# Patient Record
Sex: Female | Born: 1955 | Race: Black or African American | Hispanic: No | Marital: Married | Smoking: Never smoker
Health system: Southern US, Community
[De-identification: ages and names within clinical notes are randomized; demographics above are authoritative.]

## PROBLEM LIST (undated history)

## (undated) DIAGNOSIS — I129 Hypertensive chronic kidney disease with stage 1 through stage 4 chronic kidney disease, or unspecified chronic kidney disease: Secondary | ICD-10-CM

## (undated) DIAGNOSIS — N182 Chronic kidney disease, stage 2 (mild): Secondary | ICD-10-CM

## (undated) DIAGNOSIS — G43909 Migraine, unspecified, not intractable, without status migrainosus: Secondary | ICD-10-CM

## (undated) DIAGNOSIS — E559 Vitamin D deficiency, unspecified: Secondary | ICD-10-CM

## (undated) DIAGNOSIS — I10 Essential (primary) hypertension: Secondary | ICD-10-CM

## (undated) DIAGNOSIS — E7800 Pure hypercholesterolemia, unspecified: Secondary | ICD-10-CM

## (undated) DIAGNOSIS — G4730 Sleep apnea, unspecified: Secondary | ICD-10-CM

## (undated) DIAGNOSIS — E663 Overweight: Secondary | ICD-10-CM

## (undated) HISTORY — DX: Pure hypercholesterolemia, unspecified: E78.00

## (undated) HISTORY — DX: Migraine, unspecified, not intractable, without status migrainosus: G43.909

## (undated) HISTORY — PX: OTHER SURGICAL HISTORY: SHX169

## (undated) HISTORY — DX: Vitamin D deficiency, unspecified: E55.9

## (undated) HISTORY — DX: Hypertensive chronic kidney disease with stage 1 through stage 4 chronic kidney disease, or unspecified chronic kidney disease: I12.9

## (undated) HISTORY — DX: Essential (primary) hypertension: I10

## (undated) HISTORY — DX: Chronic kidney disease, stage 2 (mild): N18.2

## (undated) HISTORY — DX: Overweight: E66.3

## (undated) HISTORY — PX: TOTAL ABDOMINAL HYSTERECTOMY W/ BILATERAL SALPINGOOPHORECTOMY: SHX83

---

## 2000-09-10 ENCOUNTER — Encounter: Payer: Self-pay | Admitting: Emergency Medicine

## 2000-09-10 ENCOUNTER — Encounter: Admission: RE | Admit: 2000-09-10 | Discharge: 2000-09-10 | Payer: Self-pay | Admitting: Emergency Medicine

## 2000-11-24 ENCOUNTER — Other Ambulatory Visit: Admission: RE | Admit: 2000-11-24 | Discharge: 2000-11-24 | Payer: Self-pay | Admitting: Obstetrics and Gynecology

## 2001-04-28 ENCOUNTER — Emergency Department (HOSPITAL_COMMUNITY): Admission: EM | Admit: 2001-04-28 | Discharge: 2001-04-28 | Payer: Self-pay | Admitting: Emergency Medicine

## 2001-04-28 ENCOUNTER — Encounter: Payer: Self-pay | Admitting: Emergency Medicine

## 2001-06-04 ENCOUNTER — Encounter: Admission: RE | Admit: 2001-06-04 | Discharge: 2001-09-02 | Payer: Self-pay | Admitting: Internal Medicine

## 2001-12-17 ENCOUNTER — Other Ambulatory Visit: Admission: RE | Admit: 2001-12-17 | Discharge: 2001-12-17 | Payer: Self-pay | Admitting: Obstetrics and Gynecology

## 2002-01-18 ENCOUNTER — Encounter: Admission: RE | Admit: 2002-01-18 | Discharge: 2002-04-18 | Payer: Self-pay | Admitting: Internal Medicine

## 2002-03-15 ENCOUNTER — Encounter: Admission: RE | Admit: 2002-03-15 | Discharge: 2002-03-15 | Payer: Self-pay | Admitting: Internal Medicine

## 2002-03-15 ENCOUNTER — Encounter: Payer: Self-pay | Admitting: Internal Medicine

## 2002-11-15 ENCOUNTER — Ambulatory Visit (HOSPITAL_COMMUNITY): Admission: RE | Admit: 2002-11-15 | Discharge: 2002-11-15 | Payer: Self-pay | Admitting: Cardiology

## 2003-01-17 ENCOUNTER — Encounter (INDEPENDENT_AMBULATORY_CARE_PROVIDER_SITE_OTHER): Payer: Self-pay | Admitting: Specialist

## 2003-01-18 ENCOUNTER — Inpatient Hospital Stay (HOSPITAL_COMMUNITY): Admission: RE | Admit: 2003-01-18 | Discharge: 2003-01-19 | Payer: Self-pay | Admitting: Obstetrics and Gynecology

## 2004-04-09 ENCOUNTER — Other Ambulatory Visit: Admission: RE | Admit: 2004-04-09 | Discharge: 2004-04-09 | Payer: Self-pay | Admitting: Obstetrics and Gynecology

## 2004-09-18 ENCOUNTER — Encounter: Admission: RE | Admit: 2004-09-18 | Discharge: 2004-09-18 | Payer: Self-pay | Admitting: Internal Medicine

## 2005-05-20 ENCOUNTER — Other Ambulatory Visit: Admission: RE | Admit: 2005-05-20 | Discharge: 2005-05-20 | Payer: Self-pay | Admitting: Obstetrics and Gynecology

## 2005-07-23 ENCOUNTER — Emergency Department (HOSPITAL_COMMUNITY): Admission: EM | Admit: 2005-07-23 | Discharge: 2005-07-23 | Payer: Self-pay | Admitting: Family Medicine

## 2006-04-21 ENCOUNTER — Emergency Department (HOSPITAL_COMMUNITY): Admission: EM | Admit: 2006-04-21 | Discharge: 2006-04-21 | Payer: Self-pay | Admitting: Family Medicine

## 2009-03-07 ENCOUNTER — Emergency Department (HOSPITAL_COMMUNITY): Admission: EM | Admit: 2009-03-07 | Discharge: 2009-03-07 | Payer: Self-pay | Admitting: Family Medicine

## 2010-05-31 ENCOUNTER — Other Ambulatory Visit: Payer: Self-pay | Admitting: Internal Medicine

## 2010-05-31 DIAGNOSIS — R42 Dizziness and giddiness: Secondary | ICD-10-CM

## 2010-06-06 ENCOUNTER — Ambulatory Visit
Admission: RE | Admit: 2010-06-06 | Discharge: 2010-06-06 | Disposition: A | Discharge status: Home / Self Care | Payer: 59 | Source: Ambulatory Visit | Attending: Internal Medicine | Admitting: Internal Medicine

## 2010-06-06 DIAGNOSIS — R42 Dizziness and giddiness: Secondary | ICD-10-CM

## 2010-06-06 MED ORDER — GADOBENATE DIMEGLUMINE 529 MG/ML IV SOLN: 15.0000 mL | Freq: Once | INTRAVENOUS | Status: AC | PRN

## 2010-09-07 NOTE — Op Note (Signed)
NAME:  Morgan Norman, Morgan Norman                  ACCOUNT NO.:  [id number]   MEDICAL RECORD NO.:  [id number]                   PATIENT TYPE:  OBV   LOCATION:  9309                                 FACILITY:  [facility]   PHYSICIAN:  Naima A. Dillard, M.D.              DATE OF BIRTH:  [birth year]   DATE OF PROCEDURE:  01/17/2003  DATE OF DISCHARGE:                                 OPERATIVE REPORT   PREOPERATIVE DIAGNOSES:  1. Symptomatic fibroids.  2. Dysmenorrhea.   POSTOPERATIVE DIAGNOSES:  1. Symptomatic fibroids.  2. Dysmenorrhea.  3. Possible endometriosis.   PROCEDURES:  1. Laparoscopically assisted vaginal hysterectomy.  2. Bilateral salpingo-oophorectomy.  3. Excisional biopsy consistent with area of endometriosis on the left     uterosacral ligament.   SURGEON:  Naima A. Normand Sloop, M.D.   ASSISTANTMarquis Lunch. Adline Peals.   ANESTHESIA:  General endotracheal tube.   ESTIMATED BLOOD LOSS:  500 mL.   URINE OUTPUT:  500 mL clear at the end of the procedure.   INTRAVENOUS FLUIDS:  2300 mL of crystalloid.   COMPLICATIONS:  None.   FINDINGS:  An 8-week size, slightly irregularly boggy uterus.  Normal-  appearing tubes and ovaries bilaterally.  Normal abdominal anatomy.   PROCEDURE IN DETAIL:  The patient was taken to the operating room where she  was given general anesthesia and placed in the dorsal lithotomy position and  prepped and draped in a normal sterile fashion.  A bivalved speculum was  placed into the vagina.  The anterior lip of the cervix was grasped with a  Hulka tenaculum.  After the tenaculum was placed into the uterine cavity, a  Foley catheter was placed.  Attention was then turned to the patient's  abdomen where 5 mL of 0.25 Marcaine was placed in the infraumbilical prior  incision.  An incision was then made with a scalpel.  A Veress needle was  placed at a 45 degree angle __________ abdominal wall.  Intra-abdominal  placement confirmed with fluid filled  syringe and decrease in CO2 pressure  with insufflation of CO2 gas.  The abdomen was insufflated with 3.5 L of CO2  gas.  The instruments were removed.  A 10 mm trocar was placed into the  intra-abdominal cavity without difficulty.  The findings noted above were  seen.  At this point, two other 5 mm trocars were placed in the right and  left quadrants of the abdomen about 5 cm below the umbilicus.  Attention was  then made to not __________ the anatomy to avoid the epigastric vessels.  Marcaine 0.25% 5 mL total was used.  The 4-5 mm trocars were placed under  direct visualization with the laparoscope.  At total of 10 mL 0.25% Marcaine  was used.  The patient's right round ligament was grasped, cauterized, and  cut.  The patient's right utero-ovarian ligament was cauterized and cut.  The patient's left round ligament was cauterized and cut.  The patient's  left utero-ovarian ligament was cauterized and cut.  The patient's bladder  flap was then created with hydrodissection and cut using scissors.  The  bladder was dissected off of the uterus.  The patient's right ureter was  identified.  The infundibulopelvic ligament was doubly ligated with  endoloops and the ovary and tube were removed without difficulty and placed  in the cul-de-sac.  The patient's left infundibulopelvic ligament was also  doubly ligated after the left ureter was identified and noted to be fully  away.  The ovary and tube were then cut away from the ligated area.  Hemostasis was assured.  The endoloops, however, that were placed around the  area were found to be a little loose, so the infundibulopelvic ligament was  cauterized with bipolar cautery also when hemostasis was assured.  Attention  was then turned to the vagina where the Hulka tenaculum was removed.  A  weighted speculum was placed in the posterior fourchette.  The anterior and  posterior lips of the cervix were grasped with single-tooth tenaculum.  Pitressin  20 mL, 20 units in 100 mL, was placed in a circumferential manner  around the cervix.  The cervix was then cut in a circumferential manner with  the knife in the pubovesical fascia.  The posterior aspect of the vagina was  dissected off of the cervix.  The posterior cul-de-sac was then entered.  We  noted that we were in the posterior cul-de-sac from some of the bleeding  that was seen.  Because of the gas that escaped, the uterosacral ligaments  were clamped, cut, and suture ligated.  Hemostasis was assured.  Then we  entered the anterior space.  The peritoneum was identified and tented up  sharply.  The bladder was noted to be intact.  The uterine arteries were  singly clamped, cut, and ligated.  Hemostasis was assured.  The uterus was  then flipped.  There was a small pedicle on both sides, which were clamped,  cut, and ligated.  Hemostasis was assured.  McCall suture was then placed.  The vaginal cuff was closed with 0 chromic in a running fashion.  McCall  suture was then tied.  Attention was then turned to the abdomen.  There was  a small amount of bleeding noted along the vaginal cuff, which was made  hemostatic using Bovie cautery.  The left infundibulopelvic ligament inner  loops were still loose, however, there was no bleeding.  The cautery that  was done to that area was noted to be hemostatic.  Irrigation was then done  and everything was noted to be hemostatic.  All trocars were removed under  direct visualization of the laparoscope.  The fascia of the umbilical port  was closed with 0 Vicryl.  The skin sutures were closed with 3-0 Vicryl in  the subcuticular fashion.  The sponge, lap, and needle counts were correct x  2.  The patient went to the recovery room in stable condition.                                               Naima A. Normand Sloop, M.D.    NAD/MEDQ  D:  01/17/2003  T:  01/17/2003  Job:  [id number]

## 2010-09-07 NOTE — H&P (Signed)
NAME:  Morgan Norman, Morgan Norman                  ACCOUNT NO.:  [id number]   MEDICAL RECORD NO.:  [id number]                   PATIENT TYPE:  OBV   LOCATION:  NA                                   FACILITY:  [facility]   PHYSICIAN:  Naima A. Dillard, M.D.              DATE OF BIRTH:  12/19/1955   DATE OF ADMISSION:  01/16/2003  DATE OF DISCHARGE:                                HISTORY & PHYSICAL   CHIEF COMPLAINT:  Symptomatic fibroids, desires definitive treatment.   HISTORY OF PRESENT ILLNESS:  The patient is a 55 year old African-American  female gravida 2, para 1 who presented back in August of 2003 with  dysmenorrhea and complaints of menorrhagia. The patient stated that she was  having periods every month and had some cramping. Anaprox did give her some  relief but not total. Says that her periods are coming every month, lasting  3-4 days and she soaks through a tampon and pad through the first 3-4 days  every hour. The patient denies having any chest pain or shortness of breath.  She denies having any intermenstrual bleeding. The patient did have an  ultrasound which was significant for a 10 week size uterus and several small  fibroids, the largest being 1.3 x 1.2 cm. Her uterus measures 10.3 x 6.1 x  5.8 in size. Endometrial strip was 9.3 mm. Endometrial biopsy in the year of  2003 was significant for benign secretory endometrium. The patient has tried  birth control pills, Anaprox and Lupron but still desires to have definitive  treatment along with bilateral salpingo-oophorectomy.   PAST MEDICAL HISTORY:  Significant for high blood pressure and seasonal  allergies.   ALLERGIES:  No known drug allergies.   MEDICATIONS:  Include Cardizem LA 240 mg q.h.s., Singulair 10 mg q.d.,  imipramine/HCL 50 mg q.h.s.   PAST OBSTETRICAL HISTORY:  Significant for cesarean section x1 and a tubal  ligation. Also a D&E x1.   PAST SURGICAL HISTORY:  As above. Also significant for a  bunionectomy.   SOCIAL HISTORY:  Negative for tobacco, alcohol, and illicit drug use.   REVIEW OF SYSTEMS:  She wears corrected lenses. CARDIOVASCULAR: She has high  blood pressure. GI: Unremarkable. MUSCULOSKELETAL: Unremarkable. GU: As  above.   PHYSICAL EXAMINATION:  VITAL SIGNS: Weight 176 pounds. Blood pressure  120/80.  HEENT: Pupils are equal. Hearing is normal. Throat is clear. Thyroid is not  enlarged.  HEART: Regular rate and rhythm.  CHEST: Clear to auscultation bilaterally.  BREAST: No masses, discharge, skin changes, or nipple retraction.  BACK: No CVA tenderness.  ABDOMEN: Non-tender without mass or organomegaly. Soft and nontender.  EXTREMITIES: No clubbing, cyanosis, or edema bilaterally.  NEUROLOGIC: Examination is within normal limits.  PELVIC: Vulva and vaginal examination is within normal limits. Cervix is  nontender without lesions. Uterus is about 12 week size and irregular.  Adnexa has no masses.  RECTOVAGINAL: Unremarkable.   LABORATORY DATA:  Hemoglobin 11.4. Pap  smear on August 2003 was within  normal limits. The patient has never had an abnormal Pap smear.   ASSESSMENT:  Symptomatic fibroids with failed medical management of  treatment of birth control pills and Anaprox for dysmenorrhea. The patient  desires definitive treatment. She has been on Lupron for a total of 4 months  to help stop the irregular bleeding. The patient no longer wants to continue  with Lupron for another 2 months to see if this will help. She wants  definitive treatment. She understands the risk of a laparoscopic assisted  vaginal hysterectomy, bilateral salpingo-oophorectomy are but not limited to  bleeding, infection, damage to internal organs such as bowel, bladder and  major blood vessels. The patient still desires to proceed. All other forms  of treatment for fibroids were reviewed with the patient in detail. Uterine  embolization, other forms of birth control, D&C  hysteroscopy, ablation of  the uterine cavity, observation. The patient has decided to proceed.                                               Naima A. Normand Sloop, M.D.    NAD/MEDQ  D:  01/16/2003  T:  01/17/2003  Job:  [id number]

## 2010-09-07 NOTE — Discharge Summary (Signed)
NAME:  Morgan Norman, Morgan Norman                  ACCOUNT NO.:  [id number]   MEDICAL RECORD NO.:  [id number]                   PATIENT TYPE:  OBV   LOCATION:  9309                                 FACILITY:  [facility]   PHYSICIAN:  Naima A. Dillard, M.D.              DATE OF BIRTH:  28-Mar-1956   DATE OF ADMISSION:  01/17/2003  DATE OF DISCHARGE:  01/19/2003                                 DISCHARGE SUMMARY   ADMISSION DIAGNOSES:  1. Dysmenorrhea.  2. Symptomatic fibroids.   PROCEDURE:  1. Laparoscopic assisted vaginal hysterectomy.  2. Bilateral salpingo-oophorectomy.   PRE HOSPITAL MEDICATIONS:  1. Colace.  2. Tylox.  3. Ibuprofen.  4. Phenergan.  5. K-Dur.   ACTIVITY:  The patient is not to do any driving for two weeks and no heavy  lifting for four weeks.  No intercourse and to remain on pelvic rest for six  weeks.   FOLLOWUP:  She is to follow up with [facility] in six weeks.  She was also given an instruction sheet to go home with.  She has an  appointment with me on March 07, 2003 at 3 p.m.   HOSPITAL COURSE:  Morgan Norman is a 55 year old African-American female  gravida 2, para 1 who presented for a laparoscopic assisted vaginal  hysterectomy, bilateral salpingo-oophorectomy secondary to dysmenorrhea and  symptomatic fibroids.  Also failed medical treatment in the office.  The  patient underwent the surgery without any complications.  On postoperative  day #1 she had slow bowel recovery with nausea and vomiting x1.  Her vital  signs remained stable and her examination was benign.  On postoperative day  #2 she was tolerating a regular diet, passing flatus.  Vital signs were  stable.  She remained afebrile.  Abdomen was soft and nontender.  She had a  small amount of vaginal bleeding.  Her vaginal packing was removed on  postoperative day #1.  Extremities had no clubbing, cyanosis, edema.   LABORATORY DATA:  Preoperative hemoglobin 13, postoperative  hemoglobin 11.  Preoperative platelets 302,000, postoperative 240,000.  Preoperative  potassium 3.3, postoperative potassium 3.1.  We tried to replace her with a  K __________.  The patient could not tolerate the burning from the K  ________ so she was given K-Dur p.o.    DISPOSITION:  The patient was deemed to benefit from her hospital stay and  is to be discharged today, January 19, 2003 to home.  The patient is to  follow up with me in six weeks and agrees with plan.                                               Naima A. Normand Sloop, M.D.    NAD/MEDQ  D:  01/19/2003  T:  01/19/2003  Job:  [id number]   cc:  Robyn N. Allyne Gee, M.D.  [address]  [address]  [place]  [place] [address]  Fax: [phone]

## 2010-09-07 NOTE — H&P (Signed)
   NAME:  Morgan Norman, Morgan Norman                  ACCOUNT NO.:  [id number]   MEDICAL RECORD NO.:  [id number]                   PATIENT TYPE:  OIB   LOCATION:  2860                                 FACILITY:  [facility]   PHYSICIAN:  Richard A. Alanda Amass, M.D.          DATE OF BIRTH:  March 27, 1956   DATE OF ADMISSION:  11/15/2002  DATE OF DISCHARGE:                                HISTORY & PHYSICAL   TILT-TABLE STUDY:  This 55 year old African American, married, working mother was referred by  Dr. Jacinto Halim for tilt-table testing.  She has a history of syncope about a year  and a half ago and another recurrent episode recently occurring at 3:30 a.m.  after getting up to get a glass of water suffering a transient syncopal  episode hitting her neck and back, but no significant injuries.  She has  been on diuretic therapy with Dyazide, treated with Effexor, ibuprofen,  Zyrtec-D b.i.d., imipramine, and been treated with hormone replacement  therapy for fibroids and dysfunctional uterine bleeding.  A CT scan at  [facility] after a recent episode of syncope was  negative.  She has had a remote normal 2-D echo, 11/03, and has had normal  baseline laboratory with normal renal function and no diabetes, normal CBC  and differential.   She was brought to the second floor CP laboratory in the postabsorptive  state, having not taken a.m. medications.  Tilt-table testing was done using  standard tilt protocol and subsequent isoproterenol stimulation.   The patient was tilted for 30 minutes at 70 degrees.  There was no  bradycardia or hypotension and no carotid sinus hypersensitivity on testing.   She was brought back to the supine position for 10 minutes and then  isoproterenol infusion was begun per protocol.  She was titrated up to 1.5  mcg/kg/minute to a heart rate of greater than 100 and reached a heart rate  of up 117.  Blood pressure remained stable and there was no bradycardia  or  hypotension during 10-minute isoproterenol infusion - provocation.  She was  then brought back to the supine position.  Isoproterenol was discontinued.   The patient tolerated the procedure well.   Negative tilt-table test with no evidence of chronotropic or vasodepressor  response to upright tilt testing and subsequent isoproterenol provocation.   The patient will follow up with Dr. Jacinto Halim for further recommendations for a  presumptive diagnosis of vasovagal syncope, possibly in part related to the  patient's current medication.                                                Richard A. Alanda Amass, M.D.    RAW/MEDQ  D:  11/15/2002  T:  11/15/2002  Job:  [id number]

## 2012-03-31 ENCOUNTER — Other Ambulatory Visit: Payer: Self-pay | Admitting: Adult Health

## 2012-03-31 DIAGNOSIS — R05 Cough: Secondary | ICD-10-CM

## 2012-04-07 ENCOUNTER — Ambulatory Visit
Admission: RE | Admit: 2012-04-07 | Discharge: 2012-04-07 | Disposition: A | Discharge status: Home / Self Care | Source: Ambulatory Visit | Attending: Adult Health | Admitting: Adult Health

## 2012-04-07 DIAGNOSIS — R05 Cough: Secondary | ICD-10-CM

## 2013-09-16 IMAGING — RF DG ESOPHAGUS
16 of 20 series · 19 of 24 positions shown · non-contrast
Comparison: None.

CLINICAL DATA: Cough, feeling of an object stuck in throat

ESOPHOGRAM/BARIUM SWALLOW
TECHNIQUE: Combined double contrast and single contrast
examination performed using effervescent crystals, thick barium
liquid, and thin barium liquid.
Fluoroscopy time:  1.8 minutes.

[Series 2: run · 1 of 1 slices shown (1 of 16)]
[im 1/1]
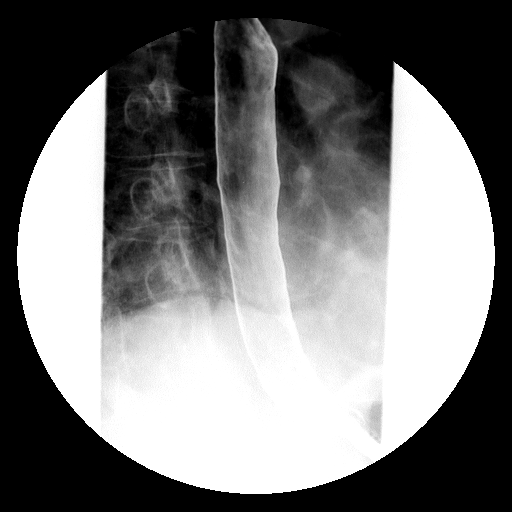

[Series 3: run · 1 of 1 slices shown (2 of 16)]
[im 1/1]
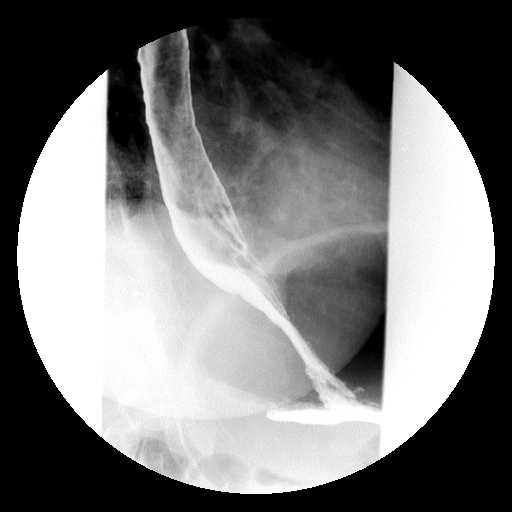

[Series 5: run · 1 of 1 slices shown (3 of 16)]
[im 1/1]
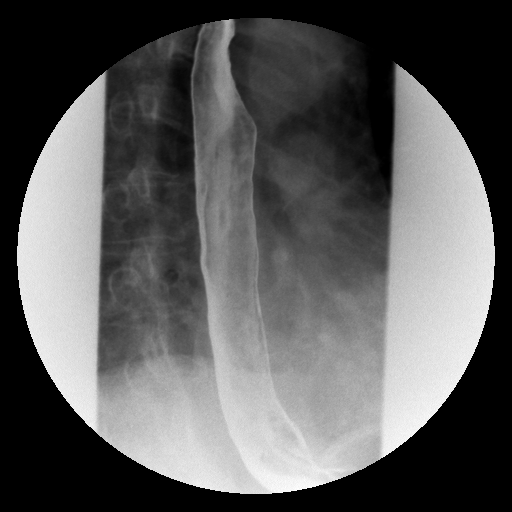

[Series 7: run · 1 of 1 slices shown (4 of 16)]
[im 1/1]
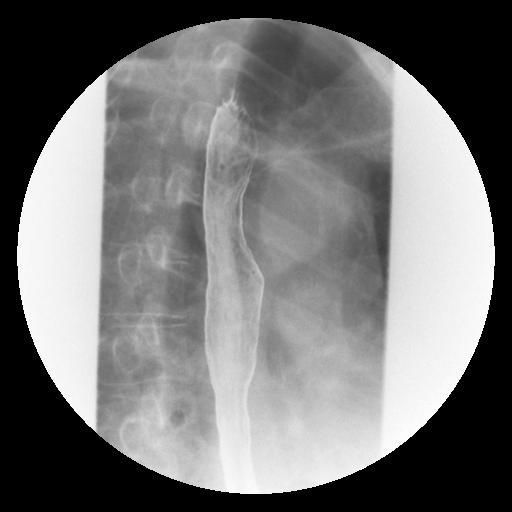

[Series 8: run · 3 of 9 slices shown (5 of 16)]
[im 1/9]
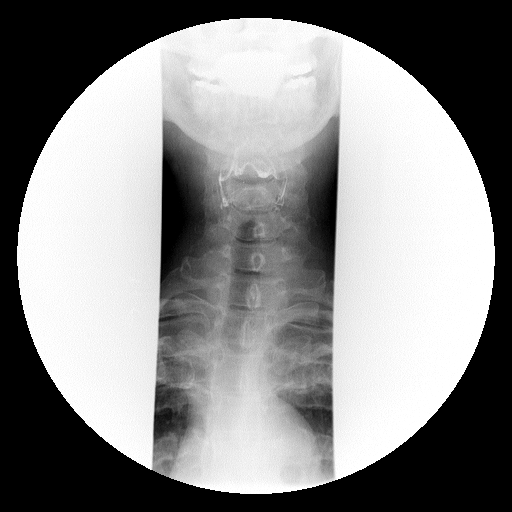
[im 3/9]
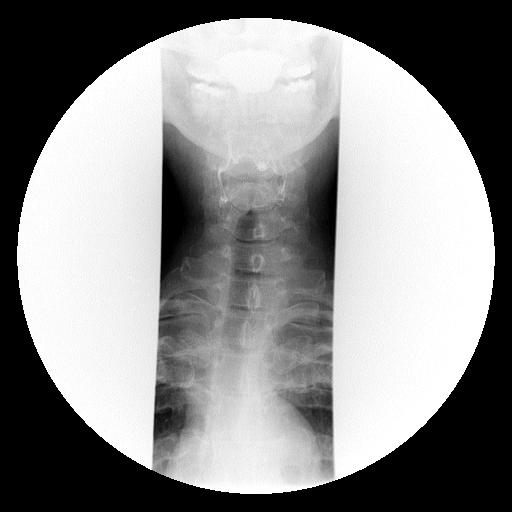
[im 9/9]
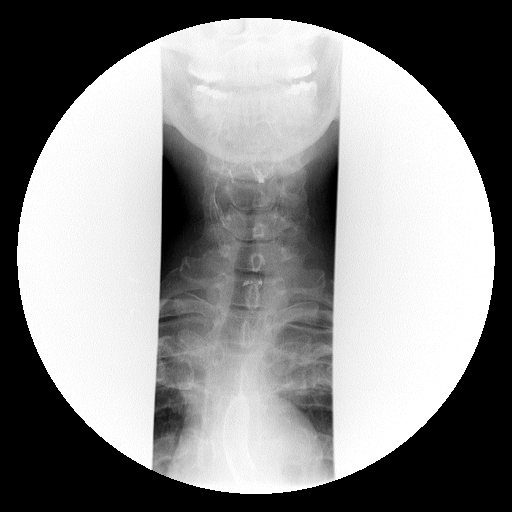

[Series 9: run · 2 of 5 slices shown (6 of 16)]
[im 1/5]
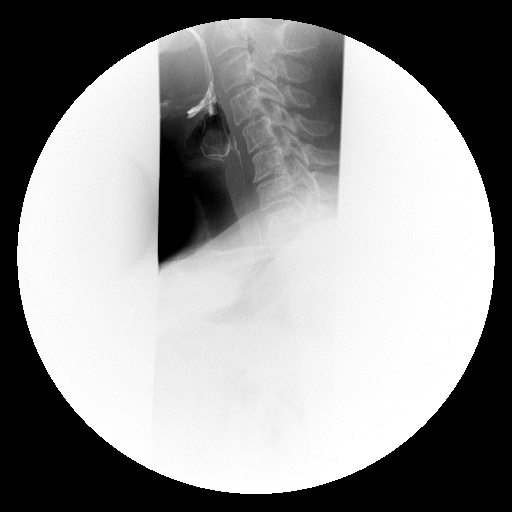
[im 5/5]
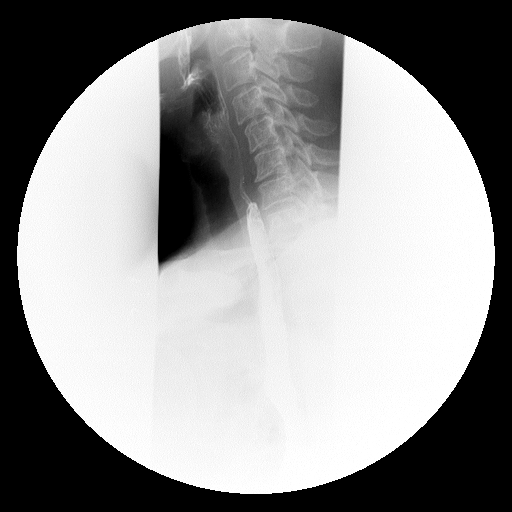

[Series 12: run · 1 of 1 slices shown (7 of 16)]
[im 1/1]
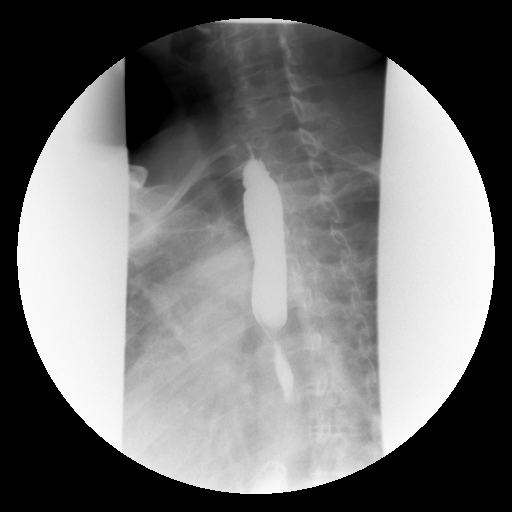

[Series 13: run · 1 of 1 slices shown (8 of 16)]
[im 1/1]
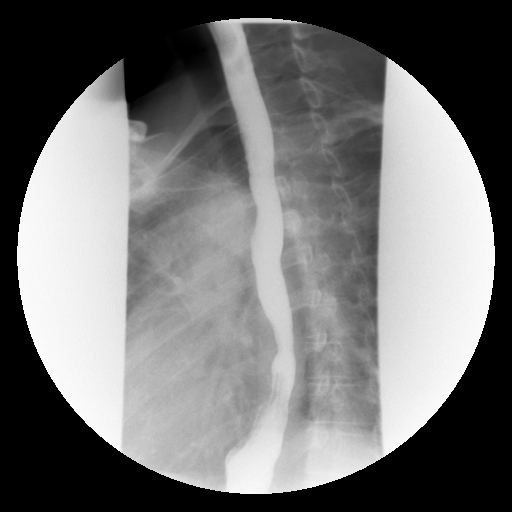

[Series 14: run · 1 of 1 slices shown (9 of 16)]
[im 1/1]
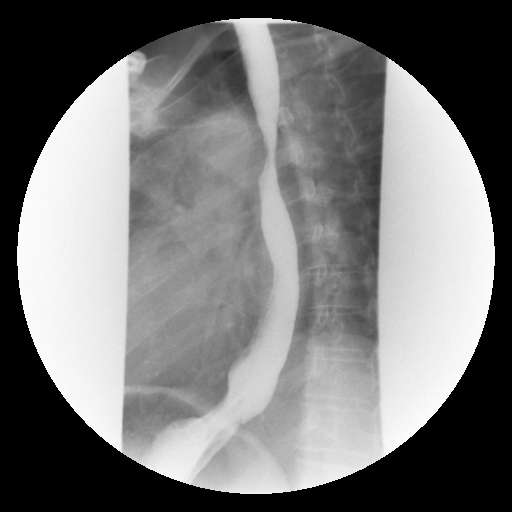

[Series 15: run · 1 of 1 slices shown (10 of 16)]
[im 1/1]
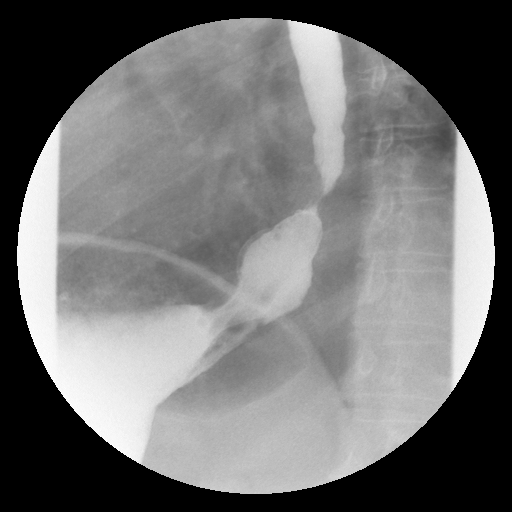

[Series 17: run · 1 of 1 slices shown (11 of 16)]
[im 1/1]
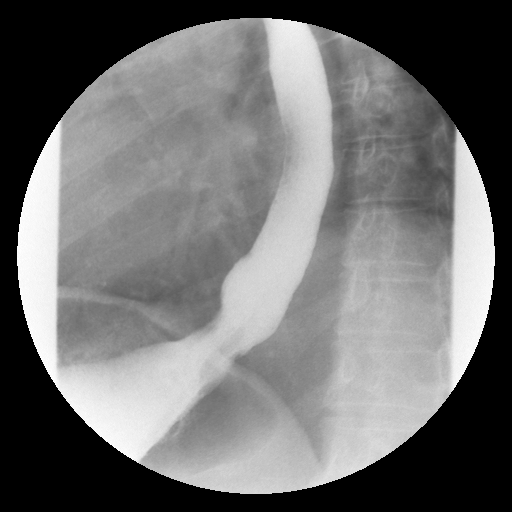

[Series 18: run · 1 of 1 slices shown (12 of 16)]
[im 1/1]
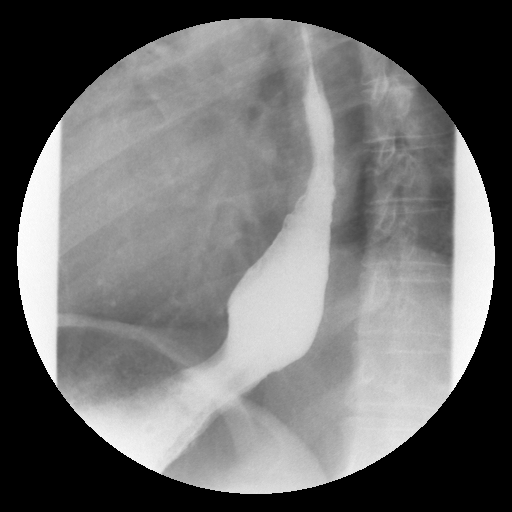

[Series 19: run · 1 of 1 slices shown (13 of 16)]
[im 1/1]
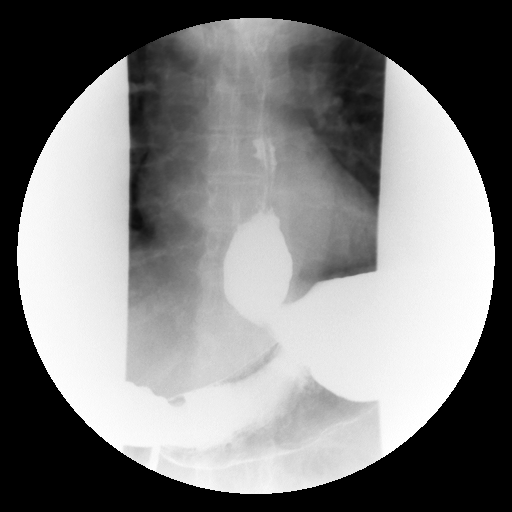

[Series 20: run · 1 of 1 slices shown (14 of 16)]
[im 1/1]
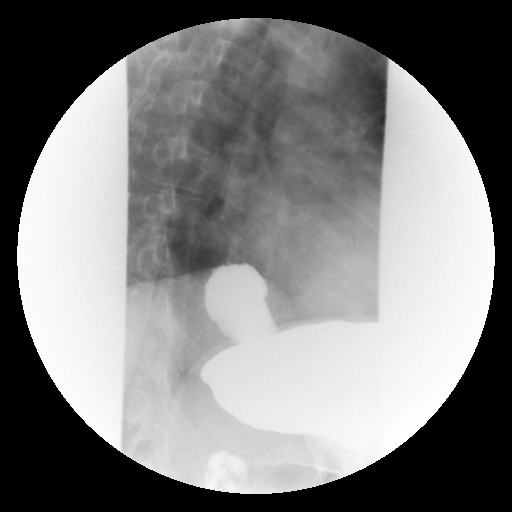

[Series 22: run · 1 of 1 slices shown (15 of 16)]
[im 1/1]
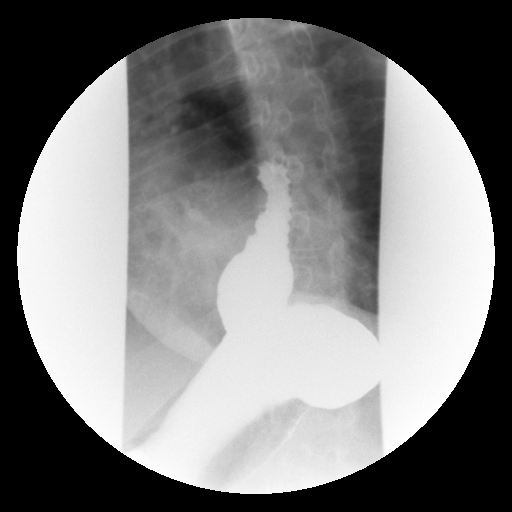

[Series 23: run · 1 of 1 slices shown (16 of 16)]
[im 1/1]
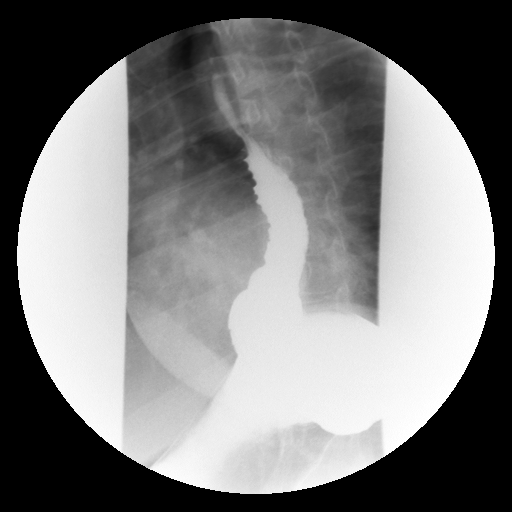

[19 of 24 positions shown; findings below may reference images not displayed]

FINDINGS: A double contrast study was performed.  The mucosa of
the esophagus is unremarkable.  A single contrast study shows the
swallowing mechanism to be normal.  There does appear to be a small
hiatal hernia present.  Mild to moderate tertiary contractions are
noted as well.  There is moderate gastroesophageal reflux
demonstrated at the end of the study.  A barium pill was given
which passed into the stomach without delay.
IMPRESSION: 1.  Small hiatal hernia with moderate gastroesophageal reflux.
2.  Mild to moderate tertiary contractions of the mid and distal
esophagus.

## 2015-02-06 ENCOUNTER — Encounter: Payer: Self-pay | Admitting: Neurology

## 2015-02-06 ENCOUNTER — Ambulatory Visit (INDEPENDENT_AMBULATORY_CARE_PROVIDER_SITE_OTHER): Admitting: Neurology

## 2015-02-06 VITALS — BP 122/80 | HR 94 | Resp 20 | Ht 66.0 in | Wt 170.0 lb

## 2015-02-06 DIAGNOSIS — R0683 Snoring: Secondary | ICD-10-CM

## 2015-02-06 NOTE — Progress Notes (Signed)
SLEEP MEDICINE CLINIC   Provider:  Melvyn Novas, M D  Referring Provider: Arnette Felts, FNP Primary Care Physician:  [name], MD  Chief Complaint  Patient presents with  . New Patient (Initial Visit)    snoring, rm 10, with husband    HPI:  Morgan Norman is a 59 y.o. female , seen here as a referral from NP Arnette Felts for a sleep evaluation.   Chief complaint according to patient : " I am told I snore"    Morgan Norman reports that she has been told that she snores. Sometimes she may have a postnasal drip and maybe having difficulties with nasal breathing but overall she has not had any other medical difficulties and does not feel that her's sleep is nonrestorative-non-refreshing. She is treated for hypertension was only 1 medication and her last general physical revealed chronic kidney disease stage II which is a mild impairment. He is not diabetic, she does not have thyroid disease, and there has been no apneas witnessed.  Sleep habits are as follows: Her average bedtime is around midnight, and she usually falls asleep promptly. The bedroom is described as cool, quiet and dark. The patient prefers to sleep on the side and usually wakes on the side as well. She is taking imipramine which helps her to sleep . Her husband has noted that she snores more when she is in supine position, louder than when on the side. But he has heard audible breathing and either position. She  does not wake at all until she has to arise in the morning.  There is no nocturia reported , no vivid dreams that interrupt her sleep , no sleepwalking or night terrors.  She rises at 5:30 AM which is very little time for sleep. She has different work assignments and this current rhythm is only been present since Labor Day of this year. She relies on an alarm. Does not use the snooze button usually she feels refreshed enough to get up. She will drink some coffee in the morning before  commuting to work. She is exposed to natural daylight at her workplace.  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     Sleep medical history and family sleep history: none known     Social history:  One glass of wine a month , no tobacco, 1-2 cups coffee.   Review of Systems: Out of a complete 14 system review, the patient complains of only the following symptoms, and all other reviewed systems are negative.   Epworth score 13 , Fatigue severity score 9  ,  Geriatric depression score 1   Social History   Social History  . Marital Status: Married    Spouse Name: N/A  . Number of Children: N/A  . Years of Education: N/A   Occupational History  . Not on file.   Social History Main Topics  . Smoking status: Never Smoker   . Smokeless tobacco: Not on file  . Alcohol Use: 0.0 oz/week    0 Standard drinks or equivalent per week     Comment: rarely  . Drug Use: No  . Sexual Activity: Not on file   Other Topics Concern  . Not on file   Social History Narrative   Drinks 2 cups of coffee daily.    Family History  Problem Relation Age of Onset  . Diabetes Mellitus  I Mother   . Bladder Cancer Mother   . Alcoholism Father     Past Medical History  Diagnosis Date  . Hypertension   . CKD (chronic kidney disease) stage 2, GFR 60-89 ml/min   . Vitamin D deficiency   . Benign hypertensive renal disease   . Overweight   . Pure hypercholesterolemia   . Migraines     Past Surgical History  Procedure Laterality Date  . Total abdominal hysterectomy w/ bilateral salpingoophorectomy    . Bilateral foot surgery    . Cesarean section      Current Outpatient Prescriptions  Medication Sig Dispense Refill  . Cholecalciferol 5000 UNITS TABS Take 5,000 Units by mouth.    . cyclobenzaprine (FLEXERIL) 10 MG tablet Take 10 mg by mouth every 8 (eight) hours as needed for muscle spasms.    Marland Kitchen  imipramine (TOFRANIL) 50 MG tablet Take 50 mg by mouth daily. Take two tablets daily    . loratadine (CLARITIN) 10 MG tablet Take 10 mg by mouth daily as needed for allergies.    Marland Kitchen losartan (COZAAR) 50 MG tablet Take 50 mg by mouth daily.    Marland Kitchen PRENATAL VIT-FE-FA-CA-DSS-DHA PO Take by mouth.    . Zinc 50 MG CAPS Take 50 mg by mouth.     No current facility-administered medications for this visit.    Allergies as of 02/06/2015  . (No Known Allergies)    Vitals: BP 122/80 mmHg  Pulse 94  Resp 20  Ht  (1.676 m)  Wt 170 lb (77.111 kg)  BMI 27.45 kg/m2 Last Weight:  Wt Readings from Last 1 Encounters:  02/06/15 170 lb (77.111 kg)   WUJ:WJXB mass index is 27.45 kg/(m^2).     Last Height:   Ht Readings from Last 1 Encounters:  02/06/15  (1.676 m)    Physical exam:  General: The patient is awake, alert and appears not in acute distress. The patient is well groomed. Head: Normocephalic, atraumatic. Neck is supple. Mallampati 4 ,  neck circumference: 14.25 . Nasal airflow mildy restricted , TMJ is evident . Retrognathia is seen.  Cardiovascular:  Regular rate and rhythm , without  murmurs or carotid bruit, and without distended neck veins. Respiratory: Lungs are clear to auscultation. Skin:  Without evidence of edema, or rash Trunk: BMI is elevated . The patient's posture is erect. Neurologic exam : The patient is awake and alert, oriented to place and time.   Memory subjective described as intact.  Attention span & concentration ability appears normal.  Speech is fluent,  without dysarthria, dysphonia or aphasia.  Mood and affect are appropriate.  Cranial nerves: Pupils are equal and briskly reactive to light.  Funduscopic exam without evidence of pallor or edema.  Extraocular movements  in vertical and horizontal planes intact and without nystagmus. Visual fields by finger perimetry are intact. Hearing to finger rub intact.   Facial sensation intact to fine touch.   Facial motor strength is symmetric and tongue and uvula move midline. Shoulder shrug was symmetrical.   Motor exam: Normal tone, muscle bulk and symmetric strength in all extremities.  Sensory:  Fine touch, pinprick and vibration were tested in all extremities. Proprioception tested in the upper extremities was normal.  Coordination: Rapid alternating movements in the fingers/hands was normal. Finger-to-nose maneuver  normal without evidence of ataxia, dysmetria or tremor.  Gait and station: Patient walks without assistive device and is able unassisted to climb up to the exam table. Strength within  normal limits.  Stance is stable and normal.   Toe and heel stand were tested .Tandem gait is unfragmented. Turns with 3 Steps.   Deep tendon reflexes: in the  upper and lower extremities are symmetric and intact. Babinski maneuver response is  downgoing.  The patient was advised of the nature of the diagnosed sleep disorder , the treatment options and risks for general a health and wellness arising from not treating the condition.  I spent more than 35 minutes of face to face time with the patient. Greater than 50% of time was spent in counseling and coordination of care. We have discussed the diagnosis and differential and I answered the patient's questions.     Assessment:  After physical and neurologic examination, review of laboratory studies,  Personal review of imaging studies, reports of other /same  Imaging studies ,  Results of polysomnography/ neurophysiology testing and pre-existing records as far as provided in visit., my assessment is   1) snoring only- no high suspecion of OSA- will order HST to confirm presence or absence of apnea.  I explained to the patient that I suspect that she may be a snorer only and may be having some hypotony as but they rarely see patients with apnea but hadn't been noticed by the spouse. There may be dental devices or ENT procedures that can treat the  snoring more effectively and avoiding sleeping on her back will also reduce snoring. I explained the tennis ball method.      Plan:  Treatment plan and additional workup : HST, RV after test.     Melvyn Novasarmen Lissete Maestas MD  02/06/2015    CC: Nurse practitioner:   Arnette FeltsJanece Moore [address] [place], [place] [address]

## 2015-02-06 NOTE — Patient Instructions (Signed)

## 2015-02-15 ENCOUNTER — Telehealth: Payer: Self-pay | Admitting: Neurology

## 2015-02-15 DIAGNOSIS — R0683 Snoring: Secondary | ICD-10-CM

## 2015-02-15 DIAGNOSIS — G4730 Sleep apnea, unspecified: Secondary | ICD-10-CM

## 2015-02-15 NOTE — Telephone Encounter (Signed)
Pt only has snoring. Per Dr. Oliva Bustardohmeier's notes, "snoring only- no high suspecion of OSA- will order HST to confirm presence or absence of apnea.  I explained to the patient that I suspect that she may be a snorer only and may be having some hypotony as but they rarely see patients with apnea but hadn't been noticed by the spouse. There may be dental devices or ENT procedures that can treat the snoring more effectively and avoiding sleeping on her back will also reduce snoring."  Since insurance will not cover an HST or split night, and pt does not qualify for any of the diagnoses needed, I will ask Dr. Vickey Hugerohmeier what she recommends for this patient.

## 2015-02-15 NOTE — Telephone Encounter (Signed)
Home Sleep Test denied by [employer].  Split Sleep Study denied by insurance.  Diagnosis has to be one of the following list: narcolepsy, OSA, impotence or parasomnias such as bruxism, sleepwalking, enuresis and seizure disorder evaluations.

## 2015-02-16 NOTE — Telephone Encounter (Signed)
She may have apnea or hypopnea, inspite of not being witnessed by her spouse. She has been snoring. Looking for apnea/ hypopnea.

## 2015-05-02 ENCOUNTER — Telehealth: Payer: Self-pay | Admitting: Neurology

## 2015-05-02 ENCOUNTER — Telehealth: Payer: Self-pay | Admitting: Internal Medicine

## 2015-05-02 NOTE — Telephone Encounter (Signed)
I need an order for this patient to have a HST.  I see the order for HST in the notes but have a PSG ordered.  [employer] denied PSG

## 2015-05-02 NOTE — Telephone Encounter (Signed)
Per your note on 02/15/2015, both the PSG and HST were denied by [employer]. Both of the orders are already in the system for an HST and PSG.

## 2015-05-04 ENCOUNTER — Encounter (INDEPENDENT_AMBULATORY_CARE_PROVIDER_SITE_OTHER): Admitting: Neurology

## 2015-05-04 DIAGNOSIS — G4733 Obstructive sleep apnea (adult) (pediatric): Secondary | ICD-10-CM

## 2015-05-04 DIAGNOSIS — R0683 Snoring: Secondary | ICD-10-CM

## 2015-05-10 ENCOUNTER — Telehealth: Payer: Self-pay

## 2015-05-10 DIAGNOSIS — G4733 Obstructive sleep apnea (adult) (pediatric): Secondary | ICD-10-CM

## 2015-05-10 NOTE — Telephone Encounter (Signed)
Spoke to pt and advised her that her HST revealed osa and treatment is advised. PAP therapy is indicated. Dr. Golden Hurter recommends proceeding with a titration study to optimize therapy. I advised her that alternative therapies may include an oral appliance or an ENT procedure. Pt is agreeable to doing a cpap titration but wants to know the cost before scheduling. I advised her that our sleep lab will give her a call and discuss that with her. If pt decides not to proceed with cpap titration after discussing cost with sleep lab, she will call me back and schedule an appt with Dr. Vickey Huger to discuss other options. I advised pt to avoid driving or operating hazardous machinery when sleepy and to avoid sedative hypnotics which may worsen sleep apnea, alcohol, and tobacco. Pt verbalized understanding.

## 2015-06-20 ENCOUNTER — Ambulatory Visit (INDEPENDENT_AMBULATORY_CARE_PROVIDER_SITE_OTHER): Admitting: Neurology

## 2015-06-20 DIAGNOSIS — G4733 Obstructive sleep apnea (adult) (pediatric): Secondary | ICD-10-CM

## 2015-06-21 NOTE — Sleep Study (Signed)
Please see the scanned sleep study interpretation located in the Procedure tab within the Chart Review section. 

## 2015-06-27 ENCOUNTER — Telehealth: Payer: Self-pay | Admitting: Neurology

## 2015-06-27 DIAGNOSIS — G4733 Obstructive sleep apnea (adult) (pediatric): Secondary | ICD-10-CM

## 2015-06-27 NOTE — Telephone Encounter (Signed)
[facility] since [facility] [employer].

## 2015-06-27 NOTE — Telephone Encounter (Signed)
Spoke to pt regarding her sleep study results. I advised her that there was significant improvement with less respiratory events during titration and Dr. Vickey Hugerohmeier recommends starting a cpap. Pt is agreeable to starting cpap. I advised her that I would send an order to a DME and they would call her to set up her cpap. I advised pt to use her cpap for at least four hours per night. Pt verbalized understanding. Will send to [facility]. A follow up appt was made for 5/4 at 10:00. Pt verbalized understanding.

## 2015-06-27 NOTE — Telephone Encounter (Signed)
Patient is calling and states she would like to hear the results of her sleep study today if possible as she is leaving tomorrow to go out of town for 2 weeks.  Please call [phone].  Thanks!

## 2015-07-04 ENCOUNTER — Telehealth: Payer: Self-pay

## 2015-07-04 NOTE — Telephone Encounter (Signed)
-----   Message from Alexis FrockMary A Ozimek sent at 07/03/2015 10:09 AM EDT ----- I sent this order through, but have since been [facility] no [facility] [employer] as a primary payer.  Do you know if the patient has another provider?  The most up to date Insurance info I could find was from 2016. If she only has [employer] this will need to be sent to another company.  Sorry for the inconvenience.   ----- Message -----    From: Geronimo RunningKristen Jafet Wissing, RN    Sent: 06/27/2015   4:28 PM      To: Alexis FrockMary A Ozimek  New order in! Dr. Vickey Hugerohmeier is out of the office this week so she will not sign the order until next week. Thanks

## 2015-07-04 NOTE — Telephone Encounter (Signed)
I spoke to pt. She has no other insurance besides [employer]. I advised [facility] [employer] and therefore I will have to send her cpap order to [facility]. Pt verbalized understanding.

## 2015-07-16 ENCOUNTER — Encounter: Payer: Self-pay | Admitting: *Deleted

## 2015-08-24 ENCOUNTER — Encounter: Payer: Self-pay | Admitting: Neurology

## 2015-08-24 ENCOUNTER — Ambulatory Visit (INDEPENDENT_AMBULATORY_CARE_PROVIDER_SITE_OTHER): Admitting: Neurology

## 2015-08-24 VITALS — BP 136/92 | HR 86 | Resp 20 | Ht 65.0 in | Wt 168.0 lb

## 2015-08-24 DIAGNOSIS — Z9989 Dependence on other enabling machines and devices: Principal | ICD-10-CM

## 2015-08-24 DIAGNOSIS — G4733 Obstructive sleep apnea (adult) (pediatric): Secondary | ICD-10-CM

## 2015-08-24 DIAGNOSIS — M2619 Other specified anomalies of jaw-cranial base relationship: Secondary | ICD-10-CM

## 2015-08-24 NOTE — Patient Instructions (Signed)

## 2015-08-24 NOTE — Progress Notes (Signed)
SLEEP MEDICINE CLINIC   Provider:  Melvyn Novas, M D  Referring Provider: Dorothyann Peng, MD Primary Care Physician:  [name], MD  Chief Complaint  Patient presents with  . Follow-up    had sores on her nose, better now, on cpap, rm 10, alone    HPI:  Morgan Norman is a 60 y.o. female , seen here as a referral from NP Sydnee Cabal for a sleep evaluation.   Chief complaint according to patient : " I am told I snore"   Morgan Norman reports that she has been told that she snores. Sometimes she may have a postnasal drip and maybe having difficulties with nasal breathing but overall she has not had any other medical difficulties and does not feel that her's sleep is nonrestorative-non-refreshing. She is treated for hypertension was only 1 medication and her last general physical revealed chronic kidney disease stage II which is a mild impairment. He is not diabetic, she does not have thyroid disease, and there has been no apneas witnessed.  Sleep habits are as follows: Her average bedtime is around midnight, and she usually falls asleep promptly. The bedroom is described as cool, quiet and dark. The patient prefers to sleep on the side and usually wakes on the side as well. She is taking imipramine which helps her to sleep . Her husband has noted that she snores more when she is in supine position, louder than when on the side.  But he has heard audible breathing and either position. She  does not wake at all until she has to arise in the morning.  There is no nocturia reported , no vivid dreams that interrupt her sleep , no sleepwalking or night terrors. She rises at 5:30 AM which is very little time for sleep. She has different work assignments and this current rhythm is only been present since Labor Day of this year. She relies on an alarm. Does not use the snooze button usually she feels refreshed enough to get up. She will drink some coffee in the morning  before commuting to work. She is exposed to natural daylight at her workplace. 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   Sleep medical history and family sleep history: none known  Social history:  One glass of wine a month , no tobacco, 1-2 cups coffee.   Morgan Norman is seen here today for a revisit on the force of May 2017. The patient underwent first a home sleep test, dated 12 12th of January 2017, in which she was diagnosed with a mild degree of apnea. Her AHI was 12.6 she did not have irregular heartbeats she had a total desaturation time of 21.8 minutes which is not considered clinically severe. It was advised that she would come in for a in lab Titration which was performed on 06/20/2015. The patient reached an AHI of 0 on CPAP. She was only titrated to 7 cm water pressure even 5 or to be effective in reducing the AHI at 7 cm helped the most lifting the oxygen nadir. She was tried on and nasal pillow which was then prescribed by me dated third of March 2017. I would also like to add that the patient endorsed 13 points and her pre-study Epworth score, and today 9 points. She has meanwhile started using CPAP and we have a download of her CPAP available here. The  patient is a 77% compliance with an average daily use of 6 hours and 32 minutes of CPAP at 7 cm water with 3 cm EPR she still has a lot of obstructive sleep apnea is and her AHI was 10.1. There are no central apneas so is considered safe to increase her pressure of 12 cm even. I would also not be opposed to use auto-titration 5-10 cm , and see her in 30 -60 days again.   The patient feels that she has slowly begun to adjust to CPAP use, she prefers still sleeping on her side but she does not have major air leaks. I'm surprised as to why her obstructive apnea count remains at high but it was well controlled in the lab. My suspicion is that it could  be related to REM sleep.  A home sleep test doesn't give was any idea about the sleep stages the patient is in an during the titration study she went 4 times into REM sleep without significant apneas being noted.    Review of Systems: Out of a complete 14 system review, the patient complains of only the following symptoms, and all other reviewed systems are negative.   Epworth score 9 from  13 , Fatigue severity score 9    Social History   Social History  . Marital Status: Married    Spouse Name: N/A  . Number of Children: N/A  . Years of Education: N/A   Occupational History  . Not on file.   Social History Main Topics  . Smoking status: Never Smoker   . Smokeless tobacco: Not on file  . Alcohol Use: 0.0 oz/week    0 Standard drinks or equivalent per week     Comment: rarely  . Drug Use: No  . Sexual Activity: Not on file   Other Topics Concern  . Not on file   Social History Narrative   Drinks 2 cups of coffee daily.    Family History  Problem Relation Age of Onset  . Diabetes Mellitus I Mother   . Bladder Cancer Mother   . Alcoholism Father     Past Medical History  Diagnosis Date  . Hypertension   . CKD (chronic kidney disease) stage 2, GFR 60-89 ml/min   . Vitamin D deficiency   . Benign hypertensive renal disease   . Overweight   . Pure hypercholesterolemia   . Migraines     Past Surgical History  Procedure Laterality Date  . Total abdominal hysterectomy w/ bilateral salpingoophorectomy    . Bilateral foot surgery    . Cesarean section      Current Outpatient Prescriptions  Medication Sig Dispense Refill  . Cholecalciferol 5000 UNITS TABS Take 5,000 Units by mouth.    . cyclobenzaprine (FLEXERIL) 10 MG tablet Take 10 mg by mouth every 8 (eight) hours as needed for muscle spasms.    Marland Kitchen imipramine (TOFRANIL) 50 MG tablet Take 50 mg by mouth daily. Take two tablets daily    . loratadine (CLARITIN) 10 MG tablet Take 10 mg by mouth daily as needed  for allergies.    Marland Kitchen losartan (COZAAR) 50 MG tablet Take 50 mg by mouth daily.    . Omega-3 Fatty Acids (FISH OIL PO) Take 120 mg by mouth.     No current facility-administered medications for this visit.    Allergies as of 08/24/2015  . (No Known Allergies)    Vitals: BP 136/92 mmHg  Pulse 86  Resp 20  Ht 5'  5" (1.651 m)  Wt 168 lb (76.204 kg)  BMI 27.96 kg/m2 Last Weight:  Wt Readings from Last 1 Encounters:  08/24/15 168 lb (76.204 kg)   ZOX:WRUEBMI:Body mass index is 27.96 kg/(m^2).     Last Height:   Ht Readings from Last 1 Encounters:  08/24/15 5\' 5"  (1.651 m)    Physical exam:  General: The patient is awake, alert and appears not in acute distress. The patient is well groomed. Head: Normocephalic, atraumatic. Neck is supple. Mallampati 4 ,  neck circumference: 14.25 . Nasal airflow mildy restricted , TMJ is evident . Retrognathia is seen.  Cardiovascular:  Regular rate and rhythm , without  murmurs or carotid bruit, and without distended neck veins. Respiratory: Lungs are clear to auscultation. Skin:  Without evidence of edema, or rash Trunk: BMI is elevated . The patient's posture is erect. Neurologic exam : The patient is awake and alert, oriented to place and time.   Memory subjective described as intact.  Attention span & concentration ability appears normal.  Speech is fluent,  without dysarthria, dysphonia or aphasia.  Mood and affect are appropriate.  Cranial nerves: Pupils are equal and briskly reactive to light.  Facial sensation intact to fine touch. Facial motor strength is symmetric and tongue and uvula move midline. Shoulder shrug was symmetrical.   Motor exam: Normal tone, muscle bulk and symmetric strength in all extremities.  The patient was advised of the nature of the diagnosed sleep disorder , the treatment options and risks for general a health and wellness arising from not treating the condition.  I spent more than 25 minutes of face to face time  with the patient. Greater than 50% of time was spent in counseling and coordination of care. We have discussed the diagnosis and differential and I answered the patient's questions.     Assessment:  After physical and neurologic examination, review of laboratory studies,  Personal review of imaging studies, reports of other /same  Imaging studies ,  Results of polysomnography/ neurophysiology testing and pre-existing records as far as provided in visit., my assessment is   1) [name] has mild obstructive sleep apnea, which could be treated with low pressure CPAP or a dental device. CPAP at 7 cm appeared to be sufficient in the lab but unfortunately has not reduced her AHI is seen in her recent download. AHI was 10.1 I will ask her durable medical equipment company " [employer]" to put her on autotitrator, between 5 and 10 cm water pressure. If the AHI is not reduced in her next download I would consider alternative ways to treat her apnea.     Plan:  Treatment plan and additional workup :  1-2 month RV after auto titration,    Melvyn Novasarmen Ebba Goll MD  08/24/2015    CC: Nurse practitioner:   Arnette FeltsJanece Moore, NP     Dorothyann Pengobyn Sanders, Md [address] [place], [place] [address]

## 2015-11-01 ENCOUNTER — Encounter: Payer: Self-pay | Admitting: Neurology

## 2015-11-01 ENCOUNTER — Ambulatory Visit (INDEPENDENT_AMBULATORY_CARE_PROVIDER_SITE_OTHER): Admitting: Neurology

## 2015-11-01 VITALS — BP 124/84 | HR 92 | Resp 20 | Ht 65.0 in | Wt 163.0 lb

## 2015-11-01 DIAGNOSIS — Z9989 Dependence on other enabling machines and devices: Principal | ICD-10-CM

## 2015-11-01 DIAGNOSIS — G4733 Obstructive sleep apnea (adult) (pediatric): Secondary | ICD-10-CM

## 2015-11-01 NOTE — Progress Notes (Signed)
SLEEP MEDICINE CLINIC   Provider:  Melvyn Novasarmen  Aryana Wonnacott, M D  Referring Provider: Dorothyann PengSanders, Robyn, MD Primary Care Physician:  Gwynneth AlimentSANDERS,Norman N, MD  Chief Complaint  Patient presents with  . Follow-up    cpap, going well    HPI:  Morgan Norman is a 60 y.o. female , seen here as a referral from Morgan Sydnee CabalJanece  Norman for a sleep evaluation.   Chief complaint according to patient : " I am told I snore"   Morgan Norman reports that she has been told that she snores. Sometimes she may have a postnasal drip and maybe having difficulties with nasal breathing but overall she has not had any other medical difficulties and does not feel that her's sleep is nonrestorative-non-refreshing. She is treated for hypertension was only 1 medication and her last general physical revealed chronic kidney disease stage II which is a mild impairment. He is not diabetic, she does not have thyroid disease, and there has been no apneas witnessed.  Sleep habits are as follows: Her average bedtime is around midnight, and she usually falls asleep promptly. The bedroom is described as cool, quiet and dark. The patient prefers to sleep on the side and usually wakes on the side as well. She is taking imipramine which helps her to sleep . Her husband has noted that she snores more when she is in supine position, louder than when on the side.  But he has heard audible breathing and either position. She  does not wake at all until she has to arise in the morning.  There is no nocturia reported , no vivid dreams that interrupt her sleep , no sleepwalking or night terrors. She rises at 5:30 AM which is very little time for sleep. She has different work assignments and this current rhythm is only been present since Labor Day of this year. She relies on an alarm. Does not use the snooze button usually she feels refreshed enough to get up. She will drink some coffee in the morning before commuting to work. She is exposed  to natural daylight at her workplace. She has retired as a Geophysical data processorhuman resources manager, now works temp as a Scientist, physiologicalreceptionist. Her husband is a retired member of the [employer].   Sleep medical history and family sleep history: none known  Social history:  One glass of wine a month , no tobacco, 1-2 cups coffee.   Morgan Norman is seen here today for a revisit on the force of May 2017. The patient underwent first a home sleep test, dated 12 12th of January 2017, in which she was diagnosed with a mild degree of apnea. Her AHI was 12.6 she did not have irregular heartbeats she had a total desaturation time of 21.8 minutes which is not considered clinically severe. It was advised that she would come in for a in lab Titration which was performed on 06/20/2015. The patient reached an AHI of 0 on CPAP. She was only titrated to 7 cm water pressure even 5 or to be effective in reducing the AHI at 7 cm helped the most lifting the oxygen nadir. She was tried on and nasal pillow which was then prescribed by me dated third of March 2017. I would also like to add that the patient endorsed 13 points and her pre-study Epworth score, and today 9 points. She has meanwhile started using CPAP and we have a download of her CPAP available here. The patient is a 77% compliance with an average daily  use of 6 hours and 32 minutes of CPAP at 7 cm water with 3 cm EPR she still has a lot of obstructive sleep apnea is and her AHI was 10.1. There are no central apneas so is considered safe to increase her pressure of 12 cm even. I would also not be opposed to use auto-titration 5-10 cm , and see her in 30 -60 days again.   The patient feels that she has slowly begun to adjust to CPAP use, she prefers still sleeping on her side but she does not have major air leaks. I'm surprised as to why her obstructive apnea count remains at high but it was well controlled in the lab. My suspicion is that it could be related to REM sleep.  A home sleep  test doesn't give was any idea about the sleep stages the patient is in an during the titration study she went 4 times into REM sleep without significant apneas being noted.  Interval history from 11/01/2015. Morgan Norman reports that her husband reports the most benefit from her using CPAP but has controlled her snoring. Her compliance has decreased to 63%, she was traveling and not taking the CPAP with her. However she has a average daily use at time of 4 hours and 42 minutes including those days she hadn't used the machine.. She is using AutoSet between 5 and 10 cm water pressure with the 91st percentile being at exactly 10 cm. I had asked to increase the pressure to 12 cm water now that she straddles 10 cm at the 95th percentile. Compliance 63% , discussed improving this. Residual AHI was  6.5 , unrelated to air leaks.    Review of Systems: Out of a complete 14 system review, the patient complains of only the following symptoms, and all other reviewed systems are negative.  Nasal pillow.  Epworth score 4 from 13 , Fatigue severity score 15    Social History   Social History  . Marital Status: Married    Spouse Name: Norman/A  . Number of Children: Norman/A  . Years of Education: Norman/A   Occupational History  . Not on file.   Social History Main Topics  . Smoking status: Never Smoker   . Smokeless tobacco: Not on file  . Alcohol Use: 0.0 oz/week    0 Standard drinks or equivalent per week     Comment: rarely  . Drug Use: No  . Sexual Activity: Not on file   Other Topics Concern  . Not on file   Social History Narrative   Drinks 2 cups of coffee daily.    Family History  Problem Relation Age of Onset  . Diabetes Mellitus I Mother   . Bladder Cancer Mother   . Alcoholism Father     Past Medical History  Diagnosis Date  . Hypertension   . CKD (chronic kidney disease) stage 2, GFR 60-89 ml/min   . Vitamin D deficiency   . Benign hypertensive renal disease   . Overweight   .  Pure hypercholesterolemia   . Migraines     Past Surgical History  Procedure Laterality Date  . Total abdominal hysterectomy w/ bilateral salpingoophorectomy    . Bilateral foot surgery    . Cesarean section      Current Outpatient Prescriptions  Medication Sig Dispense Refill  . Cholecalciferol 5000 UNITS TABS Take 5,000 Units by mouth.    Marland Kitchen imipramine (TOFRANIL) 50 MG tablet Take 50 mg by mouth daily. Take two tablets  daily    . loratadine (CLARITIN) 10 MG tablet Take 10 mg by mouth daily as needed for allergies.    Marland Kitchen losartan (COZAAR) 50 MG tablet Take 50 mg by mouth daily.    . Omega-3 Fatty Acids (FISH OIL PO) Take 120 mg by mouth.     No current facility-administered medications for this visit.    Allergies as of 11/01/2015  . (No Known Allergies)    Vitals: BP 124/84 mmHg  Pulse 92  Resp 20  Ht 5\' 5"  (1.651 m)  Wt 163 lb (73.936 kg)  BMI 27.12 kg/m2 Last Weight:  Wt Readings from Last 1 Encounters:  11/01/15 163 lb (73.936 kg)   ZOX:WRUE mass index is 27.12 kg/(m^2).     Last Height:   Ht Readings from Last 1 Encounters:  11/01/15 5\' 5"  (1.651 m)    Physical exam:  General: The patient is awake, alert and appears not in acute distress. The patient is well groomed. Head: Normocephalic, atraumatic. Neck is supple. Mallampati 4 ,  neck circumference: 14.25 . Nasal airflow mildy restricted , TMJ is evident . Retrognathia is seen.  Cardiovascular:  Regular rate and rhythm , without  murmurs or carotid bruit, and without distended neck veins. Respiratory: Lungs are clear to auscultation. Skin:  Without evidence of edema, or rash Trunk: BMI is elevated . The patient's posture is erect. Neurologic exam : The patient is awake and alert, oriented to place and time.   Memory subjective described as intact.  Attention span & concentration ability appears normal.  Speech is fluent,  without dysarthria, dysphonia or aphasia.  Mood and affect are appropriate.  Cranial  nerves: Pupils are equal and briskly reactive to light.  Facial sensation intact to fine touch. Facial motor strength is symmetric and tongue and uvula move midline. Shoulder shrug was symmetrical.   Motor exam: Normal tone, muscle bulk and symmetric strength in all extremities.  The patient was advised of the nature of the diagnosed sleep disorder , the treatment options and risks for general a health and wellness arising from not treating the condition.  I spent more than 25 minutes of face to face time with the patient. Greater than 50% of time was spent in counseling and coordination of care. We have discussed the diagnosis and differential and I answered the patient's questions.     Assessment:  After physical and neurologic examination, review of laboratory studies,  Personal review of imaging studies, reports of other /same  Imaging studies ,  Results of polysomnography/ neurophysiology testing and pre-existing records as far as provided in visit., my assessment is   1) Morgan Norman has mild obstructive sleep apnea, which could be treated with low pressure CPAP or a dental device. CPAP at 7 cm appeared to be sufficient in the lab but unfortunately has not reduced her AHI is seen in her recent download.  AHI was 10.1 I will ask her durable medical equipment company " [employer]" to increase the upper pressure to 12 cm water , remain  on autotitrator, .    Plan:  Treatment plan and additional workup : RV in 3 month with Morgan.   Porfirio Mylar Wei Newbrough MD  11/01/2015    CC: Nurse practitioner:   Arnette Felts, Morgan     Morgan Peng, Md [address] [place], [place] [address]

## 2015-11-28 ENCOUNTER — Encounter: Payer: Self-pay | Admitting: *Deleted

## 2015-12-27 ENCOUNTER — Ambulatory Visit: Admitting: Adult Health

## 2016-06-20 NOTE — Telephone Encounter (Signed)
Error

## 2017-09-24 LAB — HM DEXA SCAN

## 2018-02-25 ENCOUNTER — Encounter: Admitting: Internal Medicine

## 2018-02-25 ENCOUNTER — Ambulatory Visit

## 2018-03-23 ENCOUNTER — Other Ambulatory Visit

## 2018-03-23 DIAGNOSIS — E785 Hyperlipidemia, unspecified: Secondary | ICD-10-CM

## 2018-03-23 DIAGNOSIS — E559 Vitamin D deficiency, unspecified: Secondary | ICD-10-CM

## 2018-03-23 DIAGNOSIS — Z0000 Encounter for general adult medical examination without abnormal findings: Secondary | ICD-10-CM

## 2018-03-24 LAB — COMPREHENSIVE METABOLIC PANEL
A/G RATIO: 1.8 (ref 1.2–2.2)
ALT: 13 IU/L (ref 0–32)
AST: 27 IU/L (ref 0–40)
Albumin: 4.4 g/dL (ref 3.6–4.8)
Alkaline Phosphatase: 76 IU/L (ref 39–117)
BUN / CREAT RATIO: 11 — AB (ref 12–28)
BUN: 10 mg/dL (ref 8–27)
Bilirubin Total: 0.3 mg/dL (ref 0.0–1.2)
CO2: 26 mmol/L (ref 20–29)
Calcium: 9.1 mg/dL (ref 8.7–10.3)
Chloride: 101 mmol/L (ref 96–106)
Creatinine, Ser: 0.9 mg/dL (ref 0.57–1.00)
GFR, EST AFRICAN AMERICAN: 78 mL/min/{1.73_m2} (ref >59)
GFR, EST NON AFRICAN AMERICAN: 68 mL/min/{1.73_m2} (ref >59)
GLOBULIN, TOTAL: 2.5 g/dL (ref 1.5–4.5)
Glucose: 84 mg/dL (ref 65–99)
POTASSIUM: 4.1 mmol/L (ref 3.5–5.2)
SODIUM: 144 mmol/L (ref 134–144)
TOTAL PROTEIN: 6.9 g/dL (ref 6.0–8.5)

## 2018-03-24 LAB — CBC WITH DIFFERENTIAL/PLATELET
Basophils Absolute: 0 10*3/uL (ref 0.0–0.2)
Basos: 1 %
EOS (ABSOLUTE): 0.2 10*3/uL (ref 0.0–0.4)
EOS: 5 %
Hematocrit: 38.8 % (ref 34.0–46.6)
Hemoglobin: 12.8 g/dL (ref 11.1–15.9)
IMMATURE GRANS (ABS): 0 10*3/uL (ref 0.0–0.1)
IMMATURE GRANULOCYTES: 0 %
Lymphocytes Absolute: 1.8 10*3/uL (ref 0.7–3.1)
Lymphs: 40 %
MCH: 29.5 pg (ref 26.6–33.0)
MCHC: 33 g/dL (ref 31.5–35.7)
MCV: 89 fL (ref 79–97)
Monocytes Absolute: 0.3 10*3/uL (ref 0.1–0.9)
Monocytes: 6 %
Neutrophils Absolute: 2.1 10*3/uL (ref 1.4–7.0)
Neutrophils: 48 %
PLATELETS: 239 10*3/uL (ref 150–450)
RBC: 4.34 x10E6/uL (ref 3.77–5.28)
RDW: 12.8 % (ref 12.3–15.4)
WBC: 4.4 10*3/uL (ref 3.4–10.8)

## 2018-03-24 LAB — LIPID PANEL
CHOLESTEROL TOTAL: 178 mg/dL (ref 100–199)
Chol/HDL Ratio: 3.2 ratio (ref 0.0–4.4)
HDL: 56 mg/dL (ref >39)
LDL Calculated: 109 mg/dL — ABNORMAL HIGH (ref 0–99)
Triglycerides: 67 mg/dL (ref 0–149)
VLDL CHOLESTEROL CAL: 13 mg/dL (ref 5–40)

## 2018-03-24 LAB — HEMOGLOBIN A1C
Est. average glucose Bld gHb Est-mCnc: 114 mg/dL
HEMOGLOBIN A1C: 5.6 % (ref 4.8–5.6)

## 2018-03-26 ENCOUNTER — Ambulatory Visit (INDEPENDENT_AMBULATORY_CARE_PROVIDER_SITE_OTHER): Admitting: Internal Medicine

## 2018-03-26 ENCOUNTER — Encounter: Payer: Self-pay | Admitting: Internal Medicine

## 2018-03-26 VITALS — BP 124/86 | HR 71 | Temp 97.9°F | Ht 63.5 in | Wt 168.4 lb

## 2018-03-26 DIAGNOSIS — I129 Hypertensive chronic kidney disease with stage 1 through stage 4 chronic kidney disease, or unspecified chronic kidney disease: Secondary | ICD-10-CM

## 2018-03-26 DIAGNOSIS — R7309 Other abnormal glucose: Secondary | ICD-10-CM

## 2018-03-26 DIAGNOSIS — Z0000 Encounter for general adult medical examination without abnormal findings: Secondary | ICD-10-CM

## 2018-03-26 DIAGNOSIS — N182 Chronic kidney disease, stage 2 (mild): Secondary | ICD-10-CM

## 2018-03-26 DIAGNOSIS — N952 Postmenopausal atrophic vaginitis: Secondary | ICD-10-CM

## 2018-03-26 LAB — POCT UA - MICROALBUMIN
Albumin/Creatinine Ratio, Urine, POC: 300
CREATININE, POC: 300 mg/dL
MICROALBUMIN (UR) POC: 80 mg/L

## 2018-03-26 LAB — POCT URINALYSIS DIPSTICK
Bilirubin, UA: NEGATIVE
GLUCOSE UA: NEGATIVE
Ketones, UA: NEGATIVE
NITRITE UA: NEGATIVE
PROTEIN UA: NEGATIVE
SPEC GRAV UA: 1.025 (ref 1.010–1.025)
UROBILINOGEN UA: 0.2 U/dL
pH, UA: 6 (ref 5.0–8.0)

## 2018-03-26 NOTE — Progress Notes (Signed)
Please see attached labs for your review.   It was great seeing you today! Happy holidays to you and your family!  Sincerely,    Mariaeduarda Defranco N. Allyne GeeSanders, MD

## 2018-03-26 NOTE — Patient Instructions (Signed)

## 2018-03-27 ENCOUNTER — Encounter: Payer: Self-pay | Admitting: Internal Medicine

## 2018-04-01 LAB — HM COLONOSCOPY

## 2018-04-05 ENCOUNTER — Encounter: Payer: Self-pay | Admitting: Internal Medicine

## 2018-04-05 DIAGNOSIS — N182 Chronic kidney disease, stage 2 (mild): Secondary | ICD-10-CM | POA: Insufficient documentation

## 2018-04-05 DIAGNOSIS — I129 Hypertensive chronic kidney disease with stage 1 through stage 4 chronic kidney disease, or unspecified chronic kidney disease: Secondary | ICD-10-CM | POA: Insufficient documentation

## 2018-04-05 DIAGNOSIS — N952 Postmenopausal atrophic vaginitis: Secondary | ICD-10-CM | POA: Insufficient documentation

## 2018-04-05 DIAGNOSIS — R7309 Other abnormal glucose: Secondary | ICD-10-CM | POA: Insufficient documentation

## 2018-04-05 NOTE — Progress Notes (Signed)
Subjective:     Patient ID: Morgan Norman , female    DOB: 1955-11-19 , 62 y.o.   MRN: [id number]   Chief Complaint  Patient presents with  . Annual Exam  . Hypertension    HPI  She is here today for a full physical exam. She has no specific concerns or complaints at this time.   Hypertension  This is a chronic problem. The current episode started more than 1 year ago. The problem has been gradually improving since onset. The problem is controlled. Pertinent negatives include no blurred vision, chest pain, headaches, palpitations or shortness of breath. The current treatment provides moderate improvement. There are no compliance problems.      Past Medical History:  Diagnosis Date  . Benign hypertensive renal disease   . CKD (chronic kidney disease) stage 2, GFR 60-89 ml/min   . Hypertension   . Migraines   . Overweight   . Pure hypercholesterolemia   . Vitamin D deficiency      Family History  Problem Relation Age of Onset  . Diabetes Mellitus I Mother   . Bladder Cancer Mother   . Alcoholism Father      Current Outpatient Medications:  .  diazepam (VALIUM) 5 MG tablet, Take by mouth., Disp: , Rfl:  .  omeprazole (PRILOSEC) 20 MG capsule, Take by mouth., Disp: , Rfl:  .  rosuvastatin (CRESTOR) 10 MG tablet, Take by mouth., Disp: , Rfl:  .  Cholecalciferol 5000 UNITS TABS, Take 5,000 Units by mouth., Disp: , Rfl:  .  loratadine (CLARITIN) 10 MG tablet, Take 10 mg by mouth daily as needed for allergies., Disp: , Rfl:  .  meclizine (ANTIVERT) 12.5 MG tablet, TK 1 T PO TID PRN FOR UP TO 10 DAYS, Disp: , Rfl: 0 .  Omega-3 Fatty Acids (FISH OIL PO), Take 120 mg by mouth., Disp: , Rfl:  .  telmisartan (MICARDIS) 20 MG tablet, TK 1 T PO QD, Disp: , Rfl: 3   No Known Allergies    No LMP recorded. Patient has had a hysterectomy.. Negative for: breast discharge, breast lump(s), breast pain and breast self exam. Associated symptoms include abnormal vaginal bleeding.  Pertinent negatives include abnormal bleeding (hematology), anxiety, decreased libido, depression, difficulty falling sleep, dyspareunia, history of infertility, nocturia, sexual dysfunction, sleep disturbances, urinary incontinence, urinary urgency, vaginal discharge and vaginal itching. Diet regular.The patient states her exercise level is  moderate, but not much recently.   . The patient's tobacco use is:  Social History   Tobacco Use  Smoking Status Never Smoker  Smokeless Tobacco Never Used  . She has been exposed to passive smoke. The patient's alcohol use is:  Social History   Substance and Sexual Activity  Alcohol Use Yes  . Alcohol/week: 0.0 standard drinks   Comment: rarely   Review of Systems  Constitutional: Negative.   HENT: Negative.   Eyes: Negative.  Negative for blurred vision.  Respiratory: Negative.  Negative for shortness of breath.   Cardiovascular: Negative.  Negative for chest pain and palpitations.  Gastrointestinal: Negative.   Endocrine: Negative.   Genitourinary: Positive for vaginal pain (she c/o vaginal dryness. this can make sexual intercourse w/ her husband painful).  Musculoskeletal: Negative.   Skin: Negative.   Allergic/Immunologic: Negative.   Neurological: Negative.  Negative for headaches.  Hematological: Negative.   Psychiatric/Behavioral: Negative.      Today's Vitals   03/26/18 1022  BP: 124/86  Pulse: 71  Temp: 97.9 F (36.6  C)  TempSrc: Oral  Weight: 168 lb 6.4 oz (76.4 kg)  Height: 5' 3.5" (1.613 m)   Body mass index is 29.36 kg/m.   Objective:  Physical Exam Vitals signs and nursing note reviewed.  Constitutional:      Appearance: Normal appearance.  HENT:     Head: Normocephalic and atraumatic.     Right Ear: Tympanic membrane, ear canal and external ear normal. There is no impacted cerumen.     Left Ear: Tympanic membrane, ear canal and external ear normal. There is no impacted cerumen.     Nose: Nose normal. No  congestion.     Mouth/Throat:     Mouth: Mucous membranes are moist.     Pharynx: Oropharynx is clear. No oropharyngeal exudate or posterior oropharyngeal erythema.  Eyes:     Extraocular Movements: Extraocular movements intact.     Conjunctiva/sclera: Conjunctivae normal.     Pupils: Pupils are equal, round, and reactive to light.  Cardiovascular:     Rate and Rhythm: Normal rate and regular rhythm.     Pulses: Normal pulses.     Heart sounds: Normal heart sounds.  Pulmonary:     Effort: Pulmonary effort is normal.     Breath sounds: Normal breath sounds.  Chest:     Breasts:        Right: Normal. No swelling, bleeding, inverted nipple, mass or nipple discharge.        Left: Normal. No swelling, bleeding, inverted nipple, mass or nipple discharge.  Abdominal:     General: Abdomen is flat. Bowel sounds are normal.  Genitourinary:    Comments: deferred Musculoskeletal: Normal range of motion.  Skin:    General: Skin is warm and dry.  Neurological:     General: No focal deficit present.     Mental Status: She is alert.  Psychiatric:        Mood and Affect: Mood normal.         Assessment And Plan:     1. Routine general medical examination at health care facility  A full exam was performed. Importance of monthly self breast exams was discussed with the patient.   PATIENT HAS BEEN ADVISED TO GET 30-45 MINUTES REGULAR EXERCISE NO LESS THAN FOUR TO FIVE DAYS PER WEEK - BOTH WEIGHTBEARING EXERCISES AND AEROBIC ARE RECOMMENDED.  SHE IS ADVISED TO FOLLOW A HEALTHY DIET WITH AT LEAST SIX FRUITS/VEGGIES PER DAY, DECREASE INTAKE OF RED MEAT, AND TO INCREASE FISH INTAKE TO TWO DAYS PER WEEK.  MEATS/FISH SHOULD NOT BE FRIED, BAKED OR BROILED IS PREFERABLE.  I SUGGEST WEARING SPF 50 SUNSCREEN ON EXPOSED PARTS AND ESPECIALLY WHEN IN THE DIRECT SUNLIGHT FOR AN EXTENDED PERIOD OF TIME.  PLEASE AVOID FAST FOOD RESTAURANTS AND INCREASE YOUR WATER INTAKE.   2. Hypertensive nephropathy  Well  controlled. She will continue with current meds. She is encouraged to avoid adding salt to her foods. She will rto in six months for re-evaluation.  - CMP14+EGFR - CBC - Lipid panel - EKG 12-Lead - POCT Urinalysis Dipstick (81002) - POCT UA - Microalbumin  3. Chronic renal disease, stage II  Chronic, she is encouraged to stay well hydrated. I will check GFR, Cr today.   4. Other abnormal glucose  HER A1C HAS BEEN ELEVATED IN THE PAST. I WILL CHECK AN A1C, BMET TODAY. SHE WAS ENCOURAGED TO AVOID SUGARY BEVERAGES AND PROCESSED FOODS INCLUDNG BREADS, RICE AND PASTA.  - Hemoglobin A1c  5. Vaginitis, atrophic  We discussed  non-hormonal treatments. She will think about use of vitamin E vaginal suppositories. For now, she can try OTC treatments. Maximino Greenland, MD

## 2018-05-15 ENCOUNTER — Other Ambulatory Visit: Payer: Self-pay | Admitting: Internal Medicine

## 2018-08-27 DIAGNOSIS — R309 Painful micturition, unspecified: Secondary | ICD-10-CM | POA: Diagnosis not present

## 2018-08-27 DIAGNOSIS — N3000 Acute cystitis without hematuria: Secondary | ICD-10-CM | POA: Diagnosis not present

## 2018-09-29 ENCOUNTER — Ambulatory Visit: Admitting: Internal Medicine

## 2018-10-06 ENCOUNTER — Ambulatory Visit (INDEPENDENT_AMBULATORY_CARE_PROVIDER_SITE_OTHER): Payer: Medicare Other | Admitting: Internal Medicine

## 2018-10-06 ENCOUNTER — Other Ambulatory Visit: Payer: Self-pay

## 2018-10-06 ENCOUNTER — Encounter: Payer: Self-pay | Admitting: Internal Medicine

## 2018-10-06 VITALS — BP 122/68 | HR 65 | Temp 97.8°F | Ht 63.0 in | Wt 170.0 lb

## 2018-10-06 DIAGNOSIS — N182 Chronic kidney disease, stage 2 (mild): Secondary | ICD-10-CM

## 2018-10-06 DIAGNOSIS — E7800 Pure hypercholesterolemia, unspecified: Secondary | ICD-10-CM

## 2018-10-06 DIAGNOSIS — I129 Hypertensive chronic kidney disease with stage 1 through stage 4 chronic kidney disease, or unspecified chronic kidney disease: Secondary | ICD-10-CM | POA: Diagnosis not present

## 2018-10-06 DIAGNOSIS — R7309 Other abnormal glucose: Secondary | ICD-10-CM

## 2018-10-06 DIAGNOSIS — Z6830 Body mass index (BMI) 30.0-30.9, adult: Secondary | ICD-10-CM

## 2018-10-06 DIAGNOSIS — Z0000 Encounter for general adult medical examination without abnormal findings: Secondary | ICD-10-CM

## 2018-10-06 DIAGNOSIS — E6609 Other obesity due to excess calories: Secondary | ICD-10-CM | POA: Diagnosis not present

## 2018-10-06 LAB — POCT URINALYSIS DIPSTICK
Bilirubin, UA: NEGATIVE
Blood, UA: NEGATIVE
Glucose, UA: NEGATIVE
Ketones, UA: NEGATIVE
Leukocytes, UA: NEGATIVE
Nitrite, UA: NEGATIVE
Protein, UA: NEGATIVE
Spec Grav, UA: 1.025 (ref 1.010–1.025)
Urobilinogen, UA: 0.2 E.U./dL
pH, UA: 5.5 (ref 5.0–8.0)

## 2018-10-06 LAB — POCT UA - MICROALBUMIN
Albumin/Creatinine Ratio, Urine, POC: <30
Creatinine, POC: 300 mg/dL
Microalbumin Ur, POC: 30 mg/L

## 2018-10-06 MED ORDER — PREVNAR 13 IM SUSP
0.5000 mL | INTRAMUSCULAR | 0 refills | Status: AC
Start: 2018-10-06 — End: 2018-10-06

## 2018-10-06 NOTE — Patient Instructions (Addendum)
Dr. Darel Hong   Health Maintenance, Female Adopting a healthy lifestyle and getting preventive care can go a long way to promote health and wellness. Talk with your health care provider about what schedule of regular examinations is right for you. This is a good chance for you to check in with your provider about disease prevention and staying healthy. In between checkups, there are plenty of things you can do on your own. Experts have done a lot of research about which lifestyle changes and preventive measures are most likely to keep you healthy. Ask your health care provider for more information. Weight and diet Eat a healthy diet  Be sure to include plenty of vegetables, fruits, low-fat dairy products, and lean protein.  Do not eat a lot of foods high in solid fats, added sugars, or salt.  Get regular exercise. This is one of the most important things you can do for your health. ? Most adults should exercise for at least 150 minutes each week. The exercise should increase your heart rate and make you sweat (moderate-intensity exercise). ? Most adults should also do strengthening exercises at least twice a week. This is in addition to the moderate-intensity exercise. Maintain a healthy weight  Body mass index (BMI) is a measurement that can be used to identify possible weight problems. It estimates body fat based on height and weight. Your health care provider can help determine your BMI and help you achieve or maintain a healthy weight.  For females 16 years of age and older: ? A BMI below 18.5 is considered underweight. ? A BMI of 18.5 to 24.9 is normal. ? A BMI of 25 to 29.9 is considered overweight. ? A BMI of 30 and above is considered obese. Watch levels of cholesterol and blood lipids  You should start having your blood tested for lipids and cholesterol at 65 years of age, then have this test every 5 years.  You may need to have your cholesterol levels checked more often  if: ? Your lipid or cholesterol levels are high. ? You are older than 65 years of age. ? You are at high risk for heart disease. Cancer screening Lung Cancer  Lung cancer screening is recommended for adults 31-59 years old who are at high risk for lung cancer because of a history of smoking.  A yearly low-dose CT scan of the lungs is recommended for people who: ? Currently smoke. ? Have quit within the past 15 years. ? Have at least a 30-pack-year history of smoking. A pack year is smoking an average of one pack of cigarettes a day for 1 year.  Yearly screening should continue until it has been 15 years since you quit.  Yearly screening should stop if you develop a health problem that would prevent you from having lung cancer treatment. Breast Cancer  Practice breast self-awareness. This means understanding how your breasts normally appear and feel.  It also means doing regular breast self-exams. Let your health care provider know about any changes, no matter how small.  If you are in your 20s or 30s, you should have a clinical breast exam (CBE) by a health care provider every 1-3 years as part of a regular health exam.  If you are 67 or older, have a CBE every year. Also consider having a breast X-ray (mammogram) every year.  If you have a family history of breast cancer, talk to your health care provider about genetic screening.  If you are at high  risk for breast cancer, talk to your health care provider about having an MRI and a mammogram every year.  Breast cancer gene (BRCA) assessment is recommended for women who have family members with BRCA-related cancers. BRCA-related cancers include: ? Breast. ? Ovarian. ? Tubal. ? Peritoneal cancers.  Results of the assessment will determine the need for genetic counseling and BRCA1 and BRCA2 testing. Cervical Cancer Your health care provider may recommend that you be screened regularly for cancer of the pelvic organs (ovaries,  uterus, and vagina). This screening involves a pelvic examination, including checking for microscopic changes to the surface of your cervix (Pap test). You may be encouraged to have this screening done every 3 years, beginning at age 75.  For women ages 50-65, health care providers may recommend pelvic exams and Pap testing every 3 years, or they may recommend the Pap and pelvic exam, combined with testing for human papilloma virus (HPV), every 5 years. Some types of HPV increase your risk of cervical cancer. Testing for HPV may also be done on women of any age with unclear Pap test results.  Other health care providers may not recommend any screening for nonpregnant women who are considered low risk for pelvic cancer and who do not have symptoms. Ask your health care provider if a screening pelvic exam is right for you.  If you have had past treatment for cervical cancer or a condition that could lead to cancer, you need Pap tests and screening for cancer for at least 20 years after your treatment. If Pap tests have been discontinued, your risk factors (such as having a new sexual partner) need to be reassessed to determine if screening should resume. Some women have medical problems that increase the chance of getting cervical cancer. In these cases, your health care provider may recommend more frequent screening and Pap tests. Colorectal Cancer  This type of cancer can be detected and often prevented.  Routine colorectal cancer screening usually begins at 65 years of age and continues through 65 years of age.  Your health care provider may recommend screening at an earlier age if you have risk factors for colon cancer.  Your health care provider may also recommend using home test kits to check for hidden blood in the stool.  A small camera at the end of a tube can be used to examine your colon directly (sigmoidoscopy or colonoscopy). This is done to check for the earliest forms of colorectal  cancer.  Routine screening usually begins at age 17.  Direct examination of the colon should be repeated every 5-10 years through 65 years of age. However, you may need to be screened more often if early forms of precancerous polyps or small growths are found. Skin Cancer  Check your skin from head to toe regularly.  Tell your health care provider about any new moles or changes in moles, especially if there is a change in a mole's shape or color.  Also tell your health care provider if you have a mole that is larger than the size of a pencil eraser.  Always use sunscreen. Apply sunscreen liberally and repeatedly throughout the day.  Protect yourself by wearing long sleeves, pants, a wide-brimmed hat, and sunglasses whenever you are outside. Heart disease, diabetes, and high blood pressure  High blood pressure causes heart disease and increases the risk of stroke. High blood pressure is more likely to develop in: ? People who have blood pressure in the high end of the normal range (  130-139/85-89 mm Hg). ? People who are overweight or obese. ? People who are African American.  If you are 110-28 years of age, have your blood pressure checked every 3-5 years. If you are 42 years of age or older, have your blood pressure checked every year. You should have your blood pressure measured twice-once when you are at a hospital or clinic, and once when you are not at a hospital or clinic. Record the average of the two measurements. To check your blood pressure when you are not at a hospital or clinic, you can use: ? An automated blood pressure machine at a pharmacy. ? A home blood pressure monitor.  If you are between 28 years and 6 years old, ask your health care provider if you should take aspirin to prevent strokes.  Have regular diabetes screenings. This involves taking a blood sample to check your fasting blood sugar level. ? If you are at a normal weight and have a low risk for diabetes,  have this test once every three years after 65 years of age. ? If you are overweight and have a high risk for diabetes, consider being tested at a younger age or more often. Preventing infection Hepatitis B  If you have a higher risk for hepatitis B, you should be screened for this virus. You are considered at high risk for hepatitis B if: ? You were born in a country where hepatitis B is common. Ask your health care provider which countries are considered high risk. ? Your parents were born in a high-risk country, and you have not been immunized against hepatitis B (hepatitis B vaccine). ? You have HIV or AIDS. ? You use needles to inject street drugs. ? You live with someone who has hepatitis B. ? You have had sex with someone who has hepatitis B. ? You get hemodialysis treatment. ? You take certain medicines for conditions, including cancer, organ transplantation, and autoimmune conditions. Hepatitis C  Blood testing is recommended for: ? Everyone born from [birth year] through 1967. ? Anyone with known risk factors for hepatitis C. Sexually transmitted infections (STIs)  You should be screened for sexually transmitted infections (STIs) including gonorrhea and chlamydia if: ? You are sexually active and are younger than 65 years of age. ? You are older than 66 years of age and your health care provider tells you that you are at risk for this type of infection. ? Your sexual activity has changed since you were last screened and you are at an increased risk for chlamydia or gonorrhea. Ask your health care provider if you are at risk.  If you do not have HIV, but are at risk, it may be recommended that you take a prescription medicine daily to prevent HIV infection. This is called pre-exposure prophylaxis (PrEP). You are considered at risk if: ? You are sexually active and do not regularly use condoms or know the HIV status of your partner(s). ? You take drugs by injection. ? You are sexually  active with a partner who has HIV. Talk with your health care provider about whether you are at high risk of being infected with HIV. If you choose to begin PrEP, you should first be tested for HIV. You should then be tested every 3 months for as long as you are taking PrEP. Pregnancy  If you are premenopausal and you may become pregnant, ask your health care provider about preconception counseling.  If you may become pregnant, take 400 to 800 micrograms (  mcg) of folic acid every day.  If you want to prevent pregnancy, talk to your health care provider about birth control (contraception). Osteoporosis and menopause  Osteoporosis is a disease in which the bones lose minerals and strength with aging. This can result in serious bone fractures. Your risk for osteoporosis can be identified using a bone density scan.  If you are 38 years of age or older, or if you are at risk for osteoporosis and fractures, ask your health care provider if you should be screened.  Ask your health care provider whether you should take a calcium or vitamin D supplement to lower your risk for osteoporosis.  Menopause may have certain physical symptoms and risks.  Hormone replacement therapy may reduce some of these symptoms and risks. Talk to your health care provider about whether hormone replacement therapy is right for you. Follow these instructions at home:  Schedule regular health, dental, and eye exams.  Stay current with your immunizations.  Do not use any tobacco products including cigarettes, chewing tobacco, or electronic cigarettes.  If you are pregnant, do not drink alcohol.  If you are breastfeeding, limit how much and how often you drink alcohol.  Limit alcohol intake to no more than 1 drink per day for nonpregnant women. One drink equals 12 ounces of beer, 5 ounces of wine, or 1 ounces of hard liquor.  Do not use street drugs.  Do not share needles.  Ask your health care provider for  help if you need support or information about quitting drugs.  Tell your health care provider if you often feel depressed.  Tell your health care provider if you have ever been abused or do not feel safe at home. This information is not intended to replace advice given to you by your health care provider. Make sure you discuss any questions you have with your health care provider. Document Released: 10/22/2010 Document Revised: 09/14/2015 Document Reviewed: 01/10/2015 Elsevier Interactive Patient Education  2019 Elsevier   Morgan Norman , Thank you for taking time to come for your Medicare Wellness Visit. I appreciate your ongoing commitment to your health goals. Please review the following plan we discussed and let me know if I can assist you in the future.   These are the goals we discussed: Goals    . Exercise 150 min/wk Moderate Activity    . Weight (lb) < 160 lb (72.6 kg)       This is a list of the screening recommended for you and due dates:  Health Maintenance  Topic Date Due  . Pap Smear  09/05/1974  . DEXA scan (bone density measurement)  09/05/2018  . Pneumonia vaccines (1 of 2 - PCV13) 09/05/2018  . HIV Screening  10/06/2019*  . Flu Shot  11/21/2018  . Mammogram  11/27/2019  . Tetanus Vaccine  10/20/2023  . Colon Cancer Screening  04/01/2028  .  Hepatitis C: One time screening is recommended by Center for Disease Control  (CDC) for  adults born from [birth year] through 1967.   Completed  *Topic was postponed. The date shown is not the original due date.

## 2018-10-06 NOTE — Progress Notes (Signed)
Subjective:    Morgan Norman is a 63 y.o. female who presents for a Welcome to Medicare exam.    Hypertension This is a chronic problem. The current episode started more than 1 year ago. The problem has been gradually worsening since onset. The problem is controlled. Pertinent negatives include no blurred vision, chest pain, palpitations or shortness of breath. Risk factors for coronary artery disease include post-menopausal state, dyslipidemia and sedentary lifestyle. Past treatments include angiotensin blockers and diuretics. The current treatment provides moderate improvement. Compliance problems include exercise.  Hypertensive end-organ damage includes kidney disease.    Review of Systems  Review of Systems  Constitutional: Negative.   HENT: Negative.   Eyes: Negative.  Negative for blurred vision.  Respiratory: Negative.  Negative for shortness of breath.   Cardiovascular: Negative.  Negative for chest pain and palpitations.  Gastrointestinal: Negative.   Genitourinary: Negative.   Musculoskeletal: Negative.   Skin: Negative.   Neurological: Negative.   Endo/Heme/Allergies: Negative.   Psychiatric/Behavioral: Negative.     Cardiac Risk Factors include: hypertension and high cholesterol      Objective:    Today's Vitals   10/06/18 1026  BP: 122/68  Pulse: 65  Temp: 97.8 F (36.6 C)  TempSrc: Oral  Weight: 170 lb (77.1 kg)  Height: 5\' 3"  (1.6 m)  Body mass index is 30.11 kg/m.  Medications Outpatient Encounter Medications as of 10/06/2018  Medication Sig  . Cholecalciferol 5000 UNITS TABS Take 5,000 Units by mouth.  . loratadine (CLARITIN) 10 MG tablet Take 10 mg by mouth daily as needed for allergies.  . Omega-3 Fatty Acids (FISH OIL PO) Take 120 mg by mouth.  . telmisartan (MICARDIS) 20 MG tablet TAKE 1 TABLET BY MOUTH EVERY DAY  . diazepam (VALIUM) 5 MG tablet Take by mouth.  . pneumococcal 13-valent conjugate vaccine (PREVNAR 13) SUSP injection  Inject 0.5 mLs into the muscle tomorrow at 10 am for 1 dose.  . rosuvastatin (CRESTOR) 10 MG tablet Take by mouth.  . [DISCONTINUED] meclizine (ANTIVERT) 12.5 MG tablet TK 1 T PO TID PRN FOR UP TO 10 DAYS  . [DISCONTINUED] omeprazole (PRILOSEC) 20 MG capsule Take by mouth.   No facility-administered encounter medications on file as of 10/06/2018.      History: Past Medical History:  Diagnosis Date  . Benign hypertensive renal disease   . CKD (chronic kidney disease) stage 2, GFR 60-89 ml/min   . Hypertension   . Migraines   . Overweight   . Pure hypercholesterolemia   . Vitamin D deficiency    Past Surgical History:  Procedure Laterality Date  . bilateral foot surgery    . CESAREAN SECTION    . TOTAL ABDOMINAL HYSTERECTOMY W/ BILATERAL SALPINGOOPHORECTOMY      Family History  Problem Relation Age of Onset  . Diabetes Mellitus I Mother   . Bladder Cancer Mother   . Dementia Mother   . Alcoholism Father    Social History   Occupational History  . Not on file  Tobacco Use  . Smoking status: Never Smoker  . Smokeless tobacco: Never Used  Substance and Sexual Activity  . Alcohol use: Yes    Alcohol/week: 0.0 standard drinks    Comment: rarely  . Drug use: No  . Sexual activity: Yes    Tobacco Counseling Counseling given: Not Answered   Immunizations and Health Maintenance Immunization History  Administered Date(s) Administered  . DTaP 10/19/2013  . Td 08/09/2005   Health Maintenance Due  Topic Date Due  . PAP SMEAR-Modifier  09/05/1974  . DEXA SCAN  09/05/2018  . PNA vac Low Risk Adult (1 of 2 - PCV13) 09/05/2018    Activities of Daily Living In your present state of health, do you have any difficulty performing the following activities: 10/06/2018  Hearing? N  Vision? N  Difficulty concentrating or making decisions? N  Walking or climbing stairs? N  Dressing or bathing? N  Doing errands, shopping? N  Preparing Food and eating ? N  Using the Toilet? N   In the past six months, have you accidently leaked urine? N  Do you have problems with loss of bowel control? N  Managing your Medications? N  Managing your Finances? N  Housekeeping or managing your Housekeeping? N  Some recent data might be hidden    Physical Exam    Physical Exam  Constitutional: She is oriented to person, place, and time and well-developed, well-nourished, and in no distress.  HENT:  Head: Normocephalic and atraumatic.  Right Ear: External ear normal.  Left Ear: External ear normal.  Nose: Nose normal.  Mouth/Throat: Oropharynx is clear and moist.  Eyes: Pupils are equal, round, and reactive to light. Conjunctivae and EOM are normal.  Neck: Normal range of motion. Neck supple.  Cardiovascular: Normal rate, regular rhythm, normal heart sounds and intact distal pulses.  Pulmonary/Chest: Effort normal and breath sounds normal. Right breast exhibits no inverted nipple, no mass, no nipple discharge and no skin change. Left breast exhibits no mass, no nipple discharge and no skin change.  Abdominal: Soft. Bowel sounds are normal.  Genitourinary:    Genitourinary Comments: deferred   Musculoskeletal: Normal range of motion.  Neurological: She is alert and oriented to person, place, and time. She has normal reflexes. Gait normal. GCS score is 15.  Skin: Skin is warm and dry.  Psychiatric: Mood, memory, affect and judgment normal.  Nursing note and vitals reviewed.   Advanced Directives: Does Patient Have a Medical Advance Directive?: Yes Type of Advance Directive: Living will Does patient want to make changes to medical advance directive?: No - Patient declined    Assessment:    This is a routine wellness examination for this patient .   Vision/Hearing screen  Hearing Screening   125Hz  250Hz  500Hz  1000Hz  2000Hz  3000Hz  4000Hz  6000Hz  8000Hz   Right ear: 25  25 25 25  25     Left ear: 25  25 25 25  25       Visual Acuity Screening   Right eye Left eye Both  eyes  Without correction:     With correction: 20/30 20/30 20/30     Dietary issues and exercise activities discussed:  Current Exercise Habits: The patient does not participate in regular exercise at present, Exercise limited by: None identified  Goals    . Exercise 150 min/wk Moderate Activity    . Weight (lb) < 160 lb (72.6 kg)      Depression Screen PHQ 2/9 Scores 10/06/2018 03/26/2018 08/24/2015  PHQ - 2 Score 0 1 0  PHQ- 9 Score - 4 -     Fall Risk Fall Risk  10/06/2018  Falls in the past year? 0    Cognitive Function:     6CIT Screen 10/06/2018  What Year? 0 points  What month? 0 points  What time? 0 points  Count back from 20 0 points  Months in reverse 0 points  Repeat phrase 0 points  Total Score 0    Patient Care  Team: Dorothyann PengSanders, Phylliss Strege, MD as PCP - General (Internal Medicine)     Plan:    1. Encounter for Medicare annual wellness exam  A full exam was performed.  The Welcome to Medicare annual wellness visit was performed including discussion of advanced directives, assessment of functional status and cognitive function. PATIENT HAS BEEN ADVISED TO GET 30-45 MINUTES REGULAR EXERCISE NO LESS THAN FOUR TO FIVE DAYS PER WEEK - BOTH WEIGHTBEARING EXERCISES AND AEROBIC ARE RECOMMENDED.  SHE IS ADVISED TO FOLLOW A HEALTHY DIET WITH AT LEAST SIX FRUITS/VEGGIES PER DAY, DECREASE INTAKE OF RED MEAT, AND TO INCREASE FISH INTAKE TO TWO DAYS PER WEEK.  MEATS/FISH SHOULD NOT BE FRIED, BAKED OR BROILED IS PREFERABLE.  I SUGGEST WEARING SPF 50 SUNSCREEN ON EXPOSED PARTS AND ESPECIALLY WHEN IN THE DIRECT SUNLIGHT FOR AN EXTENDED PERIOD OF TIME.  PLEASE AVOID FAST FOOD RESTAURANTS AND INCREASE YOUR WATER INTAKE.   2. Hypertensive nephropathy  Well controlled. She will continue with current meds. She is encouraged to avoid adding salt to her foods.  EKG performed, no acute changes noted. She will rto in six months for re-evaluation.   - CBC no Diff - EKG 12-Lead - Basic Metabolic  Panel w/GFR - Hepatic function panel - Lipid panel - POCT urinalysis dipstick  3. Chronic renal disease, stage II  Chronic. I will check a GFR, Cr today. She is encouraged to stay well hydrated.   4. Other abnormal glucose  HER A1C HAS BEEN ELEVATED IN THE PAST. I WILL CHECK AN A1C, BMET TODAY. SHE WAS ENCOURAGED TO AVOID SUGARY BEVERAGES AND PROCESSED FOODS INCLUDNG BREADS, RICE AND PASTA.  - Hemoglobin A1c  5. Pure hypercholesterolemia  She admits she has not been taking her cholesterol medication. I will check fasting lipid panel today and make further recommendations once her results are available for review. It is likely she will need to resume rosuvastatin that she has taken in the past.   6. Class 1 obesity due to excess calories with serious comorbidity and body mass index (BMI) of 30.0 to 30.9 in adult  Importance of achieving optimal weight to decrease risk of cardiovascular disease and cancers was discussed with the patient in full detail. She is encouraged to start slowly - start with 10 minutes twice daily at least three to four days per week and to gradually build to 30 minutes five days weekly. She was given tips to incorporate more activity into her daily routine - take stairs when possible, park farther away from grocery stores, etc.   I have personally reviewed and noted the following in the patient's chart:   . Medical and social history . Use of alcohol, tobacco or illicit drugs  . Current medications and supplements . Functional ability and status . Nutritional status . Physical activity . Advanced directives . List of other physicians . Hospitalizations, surgeries, and ER visits in previous 12 months . Vitals . Screenings to include cognitive, depression, and falls . Referrals and appointments  In addition, I have reviewed and discussed with patient certain preventive protocols, quality metrics, and best practice recommendations. A written personalized care  plan for preventive services as well as general preventive health recommendations were provided to patient.     Gwynneth Alimentobyn N Jahmir Salo, MD 10/06/2018

## 2018-10-07 ENCOUNTER — Encounter: Payer: Self-pay | Admitting: Internal Medicine

## 2018-10-07 LAB — CBC
Hematocrit: 43.1 % (ref 34.0–46.6)
Hemoglobin: 14.3 g/dL (ref 11.1–15.9)
MCH: 30.2 pg (ref 26.6–33.0)
MCHC: 33.2 g/dL (ref 31.5–35.7)
MCV: 91 fL (ref 79–97)
Platelets: 248 10*3/uL (ref 150–450)
RBC: 4.74 x10E6/uL (ref 3.77–5.28)
RDW: 13.2 % (ref 11.7–15.4)
WBC: 4.8 10*3/uL (ref 3.4–10.8)

## 2018-10-07 LAB — HEPATIC FUNCTION PANEL
ALT: 9 IU/L (ref 0–32)
AST: 21 IU/L (ref 0–40)
Albumin: 4.6 g/dL (ref 3.8–4.8)
Alkaline Phosphatase: 76 IU/L (ref 39–117)
Bilirubin Total: 0.3 mg/dL (ref 0.0–1.2)
Bilirubin, Direct: 0.09 mg/dL (ref 0.00–0.40)
Total Protein: 7.2 g/dL (ref 6.0–8.5)

## 2018-10-07 LAB — HEMOGLOBIN A1C
Est. average glucose Bld gHb Est-mCnc: 111 mg/dL
Hgb A1c MFr Bld: 5.5 % (ref 4.8–5.6)

## 2018-10-07 LAB — BMP8+EGFR
BUN/Creatinine Ratio: 11 — ABNORMAL LOW (ref 12–28)
BUN: 10 mg/dL (ref 8–27)
CO2: 24 mmol/L (ref 20–29)
Calcium: 9.5 mg/dL (ref 8.7–10.3)
Chloride: 100 mmol/L (ref 96–106)
Creatinine, Ser: 0.87 mg/dL (ref 0.57–1.00)
GFR calc Af Amer: 81 mL/min/{1.73_m2} (ref >59)
GFR calc non Af Amer: 70 mL/min/{1.73_m2} (ref >59)
Glucose: 87 mg/dL (ref 65–99)
Potassium: 4.2 mmol/L (ref 3.5–5.2)
Sodium: 139 mmol/L (ref 134–144)

## 2018-10-07 LAB — LIPID PANEL
Chol/HDL Ratio: 4.9 ratio — ABNORMAL HIGH (ref 0.0–4.4)
Cholesterol, Total: 283 mg/dL — ABNORMAL HIGH (ref 100–199)
HDL: 58 mg/dL (ref >39)
LDL Calculated: 210 mg/dL — ABNORMAL HIGH (ref 0–99)
Triglycerides: 73 mg/dL (ref 0–149)
VLDL Cholesterol Cal: 15 mg/dL (ref 5–40)

## 2018-10-08 ENCOUNTER — Other Ambulatory Visit: Payer: Self-pay

## 2018-10-08 ENCOUNTER — Encounter: Payer: Self-pay | Admitting: Internal Medicine

## 2018-10-08 MED ORDER — ROSUVASTATIN CALCIUM 10 MG PO TABS: 10.0000 mg | ORAL_TABLET | Freq: Every day | ORAL | 0 refills | Status: DC

## 2018-10-19 DIAGNOSIS — Z1159 Encounter for screening for other viral diseases: Secondary | ICD-10-CM | POA: Diagnosis not present

## 2018-11-04 ENCOUNTER — Other Ambulatory Visit: Payer: Self-pay | Admitting: Internal Medicine

## 2018-12-17 ENCOUNTER — Other Ambulatory Visit: Payer: Self-pay

## 2018-12-17 ENCOUNTER — Encounter: Payer: Self-pay | Admitting: Internal Medicine

## 2018-12-17 ENCOUNTER — Ambulatory Visit (INDEPENDENT_AMBULATORY_CARE_PROVIDER_SITE_OTHER): Payer: Medicare Other | Admitting: Internal Medicine

## 2018-12-17 VITALS — Temp 98.6°F | Ht 64.2 in | Wt 172.8 lb

## 2018-12-17 DIAGNOSIS — N182 Chronic kidney disease, stage 2 (mild): Secondary | ICD-10-CM

## 2018-12-17 DIAGNOSIS — I129 Hypertensive chronic kidney disease with stage 1 through stage 4 chronic kidney disease, or unspecified chronic kidney disease: Secondary | ICD-10-CM

## 2018-12-17 DIAGNOSIS — J0110 Acute frontal sinusitis, unspecified: Secondary | ICD-10-CM | POA: Diagnosis not present

## 2018-12-17 DIAGNOSIS — H8110 Benign paroxysmal vertigo, unspecified ear: Secondary | ICD-10-CM | POA: Diagnosis not present

## 2018-12-17 MED ORDER — MECLIZINE HCL 25 MG PO TABS: 25.0000 mg | ORAL_TABLET | Freq: Three times a day (TID) | ORAL | 0 refills | Status: DC | PRN

## 2018-12-17 MED ORDER — AMOXICILLIN-POT CLAVULANATE 875-125 MG PO TABS: 1.0000 | ORAL_TABLET | Freq: Two times a day (BID) | ORAL | 0 refills | Status: AC

## 2018-12-17 NOTE — Patient Instructions (Signed)

## 2018-12-18 NOTE — Progress Notes (Signed)
Subjective:     Patient ID: Morgan Norman , female    DOB: 1956/03/29 , 63 y.o.   MRN: [id number]   Chief Complaint  Patient presents with  . Dizziness    HPI  She is here today for further evaluation of dizziness.  She reports her sx are similar to previous episodes of vertigo. She denies recent URI. However, she does admit to headaches and sinus congestion. She has not tried any OTC meds for relief. Her sx started about a week ago. She admits that she has not been sleeping well. She has been traveling back and forth to [place], [place] to help care for her mother.   Dizziness This is a recurrent problem. The current episode started in the past 7 days. The problem has been unchanged. Associated symptoms include headaches. The symptoms are aggravated by bending and standing. She has tried position changes and rest for the symptoms. The treatment provided no relief.     Past Medical History:  Diagnosis Date  . Benign hypertensive renal disease   . CKD (chronic kidney disease) stage 2, GFR 60-89 ml/min   . Hypertension   . Migraines   . Overweight   . Pure hypercholesterolemia   . Vitamin D deficiency      Family History  Problem Relation Age of Onset  . Diabetes Mellitus I Mother   . Bladder Cancer Mother   . Dementia Mother   . Alcoholism Father      Current Outpatient Medications:  .  Cholecalciferol 5000 UNITS TABS, Take 5,000 Units by mouth., Disp: , Rfl:  .  diazepam (VALIUM) 5 MG tablet, Take by mouth., Disp: , Rfl:  .  loratadine (CLARITIN) 10 MG tablet, Take 10 mg by mouth daily as needed for allergies., Disp: , Rfl:  .  Omega-3 Fatty Acids (FISH OIL PO), Take 120 mg by mouth., Disp: , Rfl:  .  rosuvastatin (CRESTOR) 10 MG tablet, Take 1 tablet (10 mg total) by mouth daily., Disp: 90 tablet, Rfl: 0 .  telmisartan (MICARDIS) 20 MG tablet, TAKE 1 TABLET BY MOUTH EVERY DAY, Disp: 90 tablet, Rfl: 1 .  amoxicillin-clavulanate (AUGMENTIN) 875-125 MG tablet, Take 1  tablet by mouth 2 (two) times daily for 7 days., Disp: 14 tablet, Rfl: 0 .  meclizine (ANTIVERT) 25 MG tablet, Take 1 tablet (25 mg total) by mouth 3 (three) times daily as needed for dizziness., Disp: 30 tablet, Rfl: 0   No Known Allergies   Review of Systems  Constitutional: Negative.   HENT: Positive for sinus pressure.   Respiratory: Negative.   Cardiovascular: Negative.   Gastrointestinal: Negative.   Neurological: Positive for dizziness and headaches.  Psychiatric/Behavioral: Negative.      Today's Vitals   12/17/18 1631  Temp: 98.6 F (37 C)  TempSrc: Oral  Weight: 172 lb 12.8 oz (78.4 kg)  Height: 5' 4.2" (1.631 m)  PainSc: 7   PainLoc: Head   Body mass index is 29.48 kg/m.   Objective:  Physical Exam Vitals signs and nursing note reviewed.  Constitutional:      Appearance: Normal appearance.  HENT:     Head: Normocephalic and atraumatic.     Comments: Frontal sinuses tender to percussion    Right Ear: Tympanic membrane, ear canal and external ear normal. There is no impacted cerumen.     Left Ear: Tympanic membrane, ear canal and external ear normal. There is no impacted cerumen.  Cardiovascular:     Rate and Rhythm: Normal  rate and regular rhythm.     Heart sounds: Normal heart sounds.  Pulmonary:     Effort: Pulmonary effort is normal.     Breath sounds: Normal breath sounds.  Skin:    General: Skin is warm.  Neurological:     General: No focal deficit present.     Mental Status: She is alert.  Psychiatric:        Mood and Affect: Mood normal.        Behavior: Behavior normal.         Assessment And Plan:     1. Benign paroxysmal positional vertigo, unspecified laterality  Chronic, intermittent. She was given rx meclizine to use prn. She Is advised to contact me next week to let me know how she is doing.   2. Acute non-recurrent frontal sinusitis  She was given rx Augmentin to take twice daily. She is encouraged to complete full antibiotic  course.   3. Hypertensive nephropathy  Uncontrolled. I will increase her telmisartan dose to 40mg  daily. She will take TWO (2) 20mg  tablets until she runs out. She will rto in six weeks for re-evaluation.   4. Chronic renal disease, stage II  Chronic, yet stable. I will recheck GFR at her next visit.   Gwynneth Alimentobyn N Ernest Popowski, MD    THE PATIENT IS ENCOURAGED TO PRACTICE SOCIAL DISTANCING DUE TO THE COVID-19 PANDEMIC.

## 2019-01-12 DIAGNOSIS — Z20828 Contact with and (suspected) exposure to other viral communicable diseases: Secondary | ICD-10-CM | POA: Diagnosis not present

## 2019-01-12 DIAGNOSIS — Z03818 Encounter for observation for suspected exposure to other biological agents ruled out: Secondary | ICD-10-CM | POA: Diagnosis not present

## 2019-01-13 ENCOUNTER — Ambulatory Visit (INDEPENDENT_AMBULATORY_CARE_PROVIDER_SITE_OTHER): Payer: Medicare Other | Admitting: Internal Medicine

## 2019-01-13 ENCOUNTER — Other Ambulatory Visit: Payer: Self-pay | Admitting: Internal Medicine

## 2019-01-13 ENCOUNTER — Other Ambulatory Visit: Payer: Self-pay

## 2019-01-13 ENCOUNTER — Ambulatory Visit: Payer: Medicare Other | Admitting: Internal Medicine

## 2019-01-13 ENCOUNTER — Encounter: Payer: Self-pay | Admitting: Internal Medicine

## 2019-01-13 VITALS — BP 136/76 | HR 76 | Temp 98.4°F

## 2019-01-13 DIAGNOSIS — N182 Chronic kidney disease, stage 2 (mild): Secondary | ICD-10-CM | POA: Diagnosis not present

## 2019-01-13 DIAGNOSIS — I129 Hypertensive chronic kidney disease with stage 1 through stage 4 chronic kidney disease, or unspecified chronic kidney disease: Secondary | ICD-10-CM

## 2019-01-13 DIAGNOSIS — E7800 Pure hypercholesterolemia, unspecified: Secondary | ICD-10-CM

## 2019-01-13 DIAGNOSIS — F4321 Adjustment disorder with depressed mood: Secondary | ICD-10-CM

## 2019-01-13 DIAGNOSIS — Z79899 Other long term (current) drug therapy: Secondary | ICD-10-CM

## 2019-01-13 NOTE — Patient Instructions (Signed)
Managing Loss, Adult People experience loss in many different ways throughout their lives. Events such as moving, changing jobs, and losing friends can create a sense of loss. The loss may be as serious as a major health change, divorce, death of a pet, or death of a loved one. All of these types of loss are likely to create a physical and emotional reaction known as grief. Grief is the result of a major change or an absence of something or someone that you count on. Grief is a normal reaction to loss. A variety of factors can affect your grieving experience, including:  The nature of your loss.  Your relationship to what or whom you lost.  Your understanding of grief and how to manage it.  Your support system. How to manage lifestyle changes Keep to your normal routine as much as possible.  If you have trouble focusing or doing normal activities, it is acceptable to take some time away from your normal routine.  Spend time with friends and loved ones.  Eat a healthy diet, get plenty of sleep, and rest when you feel tired. How to recognize changes  The way that you deal with your grief will affect your ability to function as you normally do. When grieving, you may experience these changes:  Numbness, shock, sadness, anxiety, anger, denial, and guilt.  Thoughts about death.  Unexpected crying.  A physical sensation of emptiness in your stomach.  Problems sleeping and eating.  Tiredness (fatigue).  Loss of interest in normal activities.  Dreaming about or imagining seeing the person who died.  A need to remember what or whom you lost.  Difficulty thinking about anything other than your loss for a period of time.  Relief. If you have been expecting the loss for a while, you may feel a sense of relief when it happens. Follow these instructions at home:  Activity Express your feelings in healthy ways, such as:  Talking with others about your loss. It may be helpful to find  others who have had a similar loss, such as a support group.  Writing down your feelings in a journal.  Doing physical activities to release stress and emotional energy.  Doing creative activities like painting, sculpting, or playing or listening to music.  Practicing resilience. This is the ability to recover and adjust after facing challenges. Reading some resources that encourage resilience may help you to learn ways to practice those behaviors. General instructions  Be patient with yourself and others. Allow the grieving process to happen, and remember that grieving takes time. ? It is likely that you may never feel completely done with some grief. You may find a way to move on while still cherishing memories and feelings about your loss. ? Accepting your loss is a process. It can take months or longer to adjust.  Keep all follow-up visits as told by your health care provider. This is important. Where to find support To get support for managing loss:  Ask your health care provider for help and recommendations, such as grief counseling or therapy.  Think about joining a support group for people who are managing a loss. Where to find more information You can find more information about managing loss from:  American Society of Clinical Oncology: www.cancer.net  American Psychological Association: www.apa.org Contact a health care provider if:  Your grief is extreme and keeps getting worse.  You have ongoing grief that does not improve.  Your body shows symptoms of grief, such   as illness.  You feel depressed, anxious, or lonely. Get help right away if:  You have thoughts about hurting yourself or others. If you ever feel like you may hurt yourself or others, or have thoughts about taking your own life, get help right away. You can go to your nearest emergency department or call:  Your local emergency services (911 in the [place]  A suicide crisis helpline, such as the  National Suicide Prevention Lifeline at 1-800-273-8255. This is open 24 hours a day. Summary  Grief is the result of a major change or an absence of someone or something that you count on. Grief is a normal reaction to loss.  The depth of grief and the period of recovery depend on the type of loss and your ability to adjust to the change and process your feelings.  Processing grief requires patience and a willingness to accept your feelings and talk about your loss with people who are supportive.  It is important to find resources that work for you and to realize that people experience grief differently. There is not one grieving process that works for everyone in the same way.  Be aware that when grief becomes extreme, it can lead to more severe issues like isolation, depression, anxiety, or suicidal thoughts. Talk with your health care provider if you have any of these issues. This information is not intended to replace advice given to you by your health care provider. Make sure you discuss any questions you have with your health care provider. Document Released: 08/22/2016 Document Revised: 06/12/2018 Document Reviewed: 08/22/2016 Elsevier Patient Education  2020 Elsevier Inc.  

## 2019-01-14 ENCOUNTER — Encounter: Payer: Self-pay | Admitting: Internal Medicine

## 2019-01-16 ENCOUNTER — Encounter: Payer: Self-pay | Admitting: Internal Medicine

## 2019-01-16 NOTE — Progress Notes (Signed)
Subjective:     Patient ID: Morgan Norman , female    DOB: [birth year] , 63 y.o.   MRN: [id number]   Chief Complaint  Patient presents with  . Hypertension    HPI  She is here today for bp check. The dose of telmsiartan was increased at her last visit.  Unfortunately, her mother passed since her last visit. She did not increase the medication. She feels fine on current dose. She denies headaches, chest pain and palpitations.   Hypertension This is a chronic problem. The current episode started more than 1 year ago. The problem has been gradually improving since onset. The problem is controlled. Pertinent negatives include no blurred vision, chest pain, palpitations or shortness of breath.     Past Medical History:  Diagnosis Date  . Benign hypertensive renal disease   . CKD (chronic kidney disease) stage 2, GFR 60-89 ml/min   . Hypertension   . Migraines   . Overweight   . Pure hypercholesterolemia   . Vitamin D deficiency      Family History  Problem Relation Age of Onset  . Diabetes Mellitus I Mother   . Bladder Cancer Mother   . Dementia Mother   . Alcoholism Father      Current Outpatient Medications:  .  Cholecalciferol 5000 UNITS TABS, Take 5,000 Units by mouth., Disp: , Rfl:  .  loratadine (CLARITIN) 10 MG tablet, Take 10 mg by mouth daily as needed for allergies., Disp: , Rfl:  .  meclizine (ANTIVERT) 25 MG tablet, Take 1 tablet (25 mg total) by mouth 3 (three) times daily as needed for dizziness., Disp: 30 tablet, Rfl: 0 .  Omega-3 Fatty Acids (FISH OIL PO), Take 120 mg by mouth., Disp: , Rfl:  .  rosuvastatin (CRESTOR) 10 MG tablet, Take 1 tablet (10 mg total) by mouth daily., Disp: 90 tablet, Rfl: 0 .  telmisartan (MICARDIS) 20 MG tablet, TAKE 1 TABLET BY MOUTH EVERY DAY, Disp: 90 tablet, Rfl: 1 .  diazepam (VALIUM) 5 MG tablet, Take by mouth., Disp: , Rfl:    No Known Allergies   Review of Systems  Constitutional: Negative.   Eyes: Negative for  blurred vision.  Respiratory: Negative.  Negative for shortness of breath.   Cardiovascular: Negative.  Negative for chest pain and palpitations.  Gastrointestinal: Negative.   Neurological: Negative.   Psychiatric/Behavioral: Positive for dysphoric mood.     Today's Vitals   01/13/19 1422  BP: 136/76  Pulse: 76  Temp: 98.4 F (36.9 C)  TempSrc: Oral  SpO2: 97%   There is no height or weight on file to calculate BMI.   Objective:  Physical Exam Vitals signs and nursing note reviewed.  Constitutional:      Appearance: Normal appearance.  HENT:     Head: Normocephalic and atraumatic.  Cardiovascular:     Rate and Rhythm: Normal rate and regular rhythm.     Heart sounds: Normal heart sounds.  Pulmonary:     Effort: Pulmonary effort is normal.     Breath sounds: Normal breath sounds.  Skin:    General: Skin is warm.  Neurological:     General: No focal deficit present.     Mental Status: She is alert.  Psychiatric:        Mood and Affect: Mood normal.        Behavior: Behavior normal.         Assessment And Plan:     1. Hypertensive nephropathy  Chronic, fair control. She will continue with telmisartan 20mg  daily.   2. Chronic renal disease, stage II  Chronic, yet stable. She is encouraged to stay well hydrated. No need to recheck since she did not increase the medication.   3. Pure hypercholesterolemia  Chronic. She was restarted on her cholesterol medications at her last visit. She reports compliance with meds.  I will recheck lipid panel and LFTs today.   - Lipid panel  4. Grief  She declines referral to Hospice for grief counseling at this time. She agrees to let me know if she changes her mind in the future.   5. Drug therapy  - Liver Profile        , MD    THE PATIENT IS ENCOURAGED TO PRACTICE SOCIAL DISTANCING DUE TO THE COVID-19 PANDEMIC.

## 2019-01-18 LAB — HEPATIC FUNCTION PANEL
ALT: 17 IU/L (ref 0–32)
AST: 26 IU/L (ref 0–40)
Albumin: 4.5 g/dL (ref 3.8–4.8)
Alkaline Phosphatase: 86 IU/L (ref 39–117)
Bilirubin Total: 0.3 mg/dL (ref 0.0–1.2)
Bilirubin, Direct: 0.11 mg/dL (ref 0.00–0.40)
Total Protein: 7.2 g/dL (ref 6.0–8.5)

## 2019-01-18 LAB — LIPID PANEL
Chol/HDL Ratio: 3.6 ratio (ref 0.0–4.4)
Cholesterol, Total: 199 mg/dL (ref 100–199)
HDL: 55 mg/dL (ref >39)
LDL Chol Calc (NIH): 125 mg/dL — ABNORMAL HIGH (ref 0–99)
Triglycerides: 109 mg/dL (ref 0–149)
VLDL Cholesterol Cal: 19 mg/dL (ref 5–40)

## 2019-01-20 ENCOUNTER — Encounter: Payer: Self-pay | Admitting: Internal Medicine

## 2019-01-21 ENCOUNTER — Other Ambulatory Visit: Payer: Self-pay | Admitting: Internal Medicine

## 2019-01-21 MED ORDER — ROSUVASTATIN CALCIUM 10 MG PO TABS: 10.0000 mg | ORAL_TABLET | Freq: Every day | ORAL | 0 refills | Status: DC

## 2019-01-29 DIAGNOSIS — Z23 Encounter for immunization: Secondary | ICD-10-CM | POA: Diagnosis not present

## 2019-01-29 DIAGNOSIS — Z1231 Encounter for screening mammogram for malignant neoplasm of breast: Secondary | ICD-10-CM | POA: Diagnosis not present

## 2019-01-29 LAB — HM MAMMOGRAPHY

## 2019-02-01 ENCOUNTER — Encounter: Payer: Self-pay | Admitting: Internal Medicine

## 2019-04-07 ENCOUNTER — Ambulatory Visit: Payer: Medicare Other | Admitting: Internal Medicine

## 2019-04-08 ENCOUNTER — Ambulatory Visit (INDEPENDENT_AMBULATORY_CARE_PROVIDER_SITE_OTHER): Payer: Medicare Other | Admitting: Internal Medicine

## 2019-04-08 ENCOUNTER — Other Ambulatory Visit: Payer: Self-pay

## 2019-04-08 ENCOUNTER — Encounter: Payer: Self-pay | Admitting: Internal Medicine

## 2019-04-08 VITALS — BP 116/74 | HR 67 | Temp 97.8°F | Ht 64.2 in | Wt 172.0 lb

## 2019-04-08 DIAGNOSIS — E7800 Pure hypercholesterolemia, unspecified: Secondary | ICD-10-CM

## 2019-04-08 DIAGNOSIS — Z634 Disappearance and death of family member: Secondary | ICD-10-CM

## 2019-04-08 DIAGNOSIS — N182 Chronic kidney disease, stage 2 (mild): Secondary | ICD-10-CM

## 2019-04-08 DIAGNOSIS — Z6379 Other stressful life events affecting family and household: Secondary | ICD-10-CM

## 2019-04-08 DIAGNOSIS — F4322 Adjustment disorder with anxiety: Secondary | ICD-10-CM

## 2019-04-08 DIAGNOSIS — I129 Hypertensive chronic kidney disease with stage 1 through stage 4 chronic kidney disease, or unspecified chronic kidney disease: Secondary | ICD-10-CM | POA: Diagnosis not present

## 2019-04-08 DIAGNOSIS — Z23 Encounter for immunization: Secondary | ICD-10-CM

## 2019-04-08 DIAGNOSIS — Z79899 Other long term (current) drug therapy: Secondary | ICD-10-CM

## 2019-04-08 MED ORDER — DIAZEPAM 5 MG PO TABS: 5.0000 mg | ORAL_TABLET | Freq: Three times a day (TID) | ORAL | 0 refills | Status: DC | PRN

## 2019-04-08 MED ORDER — TELMISARTAN 20 MG PO TABS: 20.0000 mg | ORAL_TABLET | Freq: Every day | ORAL | 1 refills | Status: DC

## 2019-04-08 MED ORDER — PNEUMOCOCCAL 13-VAL CONJ VACC IM SUSP: 0.5000 mL | INTRAMUSCULAR | 0 refills | Status: AC

## 2019-04-08 NOTE — Patient Instructions (Signed)

## 2019-04-09 LAB — LIPID PANEL
Chol/HDL Ratio: 2.8 ratio (ref 0.0–4.4)
Cholesterol, Total: 152 mg/dL (ref 100–199)
HDL: 55 mg/dL (ref >39)
LDL Chol Calc (NIH): 87 mg/dL (ref 0–99)
Triglycerides: 47 mg/dL (ref 0–149)
VLDL Cholesterol Cal: 10 mg/dL (ref 5–40)

## 2019-04-09 LAB — HEPATIC FUNCTION PANEL
ALT: 13 IU/L (ref 0–32)
AST: 20 IU/L (ref 0–40)
Albumin: 4.5 g/dL (ref 3.8–4.8)
Alkaline Phosphatase: 93 IU/L (ref 39–117)
Bilirubin Total: 0.4 mg/dL (ref 0.0–1.2)
Bilirubin, Direct: 0.13 mg/dL (ref 0.00–0.40)
Total Protein: 7.1 g/dL (ref 6.0–8.5)

## 2019-04-11 NOTE — Progress Notes (Signed)
This visit occurred during the SARS-CoV-2 public health emergency.  Safety protocols were in place, including screening questions prior to the visit, additional usage of staff PPE, and extensive cleaning of exam room while observing appropriate contact time as indicated for disinfecting solutions.  Subjective:     Patient ID: Morgan Norman , female    DOB: May 31, 1955 , 63 y.o.   MRN: [id number]   Chief Complaint  Patient presents with  . Hyperlipidemia    HPI  She is here today for a cholesterol check. She is now taking her chol meds on a regular basis.   Hyperlipidemia This is a chronic problem. The current episode started more than 1 year ago. Pertinent negatives include no chest pain, myalgias or shortness of breath. Current antihyperlipidemic treatment includes statins. The current treatment provides moderate improvement of lipids. Risk factors for coronary artery disease include hypertension and post-menopausal.     Past Medical History:  Diagnosis Date  . Benign hypertensive renal disease   . CKD (chronic kidney disease) stage 2, GFR 60-89 ml/min   . Hypertension   . Migraines   . Overweight   . Pure hypercholesterolemia   . Vitamin D deficiency      Family History  Problem Relation Age of Onset  . Diabetes Mellitus I Mother   . Bladder Cancer Mother   . Dementia Mother   . Alcoholism Father      Current Outpatient Medications:  .  Cholecalciferol 5000 UNITS TABS, Take 5,000 Units by mouth., Disp: , Rfl:  .  ELDERBERRY PO, Take 1,000 mg by mouth., Disp: , Rfl:  .  Ginkgo Biloba (GINKOBA PO), Take by mouth., Disp: , Rfl:  .  loratadine (CLARITIN) 10 MG tablet, Take 10 mg by mouth daily as needed for allergies., Disp: , Rfl:  .  rosuvastatin (CRESTOR) 10 MG tablet, Take 1 tablet (10 mg total) by mouth daily., Disp: 90 tablet, Rfl: 0 .  telmisartan (MICARDIS) 20 MG tablet, Take 1 tablet (20 mg total) by mouth daily., Disp: 90 tablet, Rfl: 1 .  diazepam  (VALIUM) 5 MG tablet, Take 1 tablet (5 mg total) by mouth every 8 (eight) hours as needed for anxiety or muscle spasms., Disp: 20 tablet, Rfl: 0 .  meclizine (ANTIVERT) 25 MG tablet, Take 1 tablet (25 mg total) by mouth 3 (three) times daily as needed for dizziness. (Patient not taking: Reported on 04/08/2019), Disp: 30 tablet, Rfl: 0 .  Omega-3 Fatty Acids (FISH OIL PO), Take 120 mg by mouth., Disp: , Rfl:    No Known Allergies   Review of Systems  Constitutional: Negative.   Respiratory: Negative.  Negative for shortness of breath.   Cardiovascular: Negative.  Negative for chest pain.  Gastrointestinal: Negative.   Musculoskeletal: Negative for myalgias.  Neurological: Negative.   Psychiatric/Behavioral: The patient is nervous/anxious.        She reports she is under a great deal of stress. She is now caring for her niece who ran away from home. She is 58 years old. She recently took pills to try to end her life. She is currently hospitalized and will be coming back home within 24 hours.     Today's Vitals   04/08/19 0922  BP: 116/74  Pulse: 67  Temp: 97.8 F (36.6 C)  TempSrc: Oral  Weight: 172 lb (78 kg)  Height: 5' 4.2" (1.631 m)  PainSc: 5   PainLoc: Back   Body mass index is 29.34 kg/m.   Objective:  Physical Exam Vitals and nursing note reviewed.  Constitutional:      Appearance: Normal appearance.  HENT:     Head: Normocephalic and atraumatic.  Cardiovascular:     Rate and Rhythm: Normal rate and regular rhythm.     Heart sounds: Normal heart sounds.  Pulmonary:     Effort: Pulmonary effort is normal.     Breath sounds: Normal breath sounds.  Skin:    General: Skin is warm.  Neurological:     General: No focal deficit present.     Mental Status: She is alert.  Psychiatric:        Mood and Affect: Mood normal.        Behavior: Behavior normal.         Assessment And Plan:     1. Pure hypercholesterolemia  I will check fasting lipid panel today. She  will continue with current meds for now. She is encouraged to avoid fried foods and aim for at least 150 minutes of exercise per week.   - Lipid panel  2. Hypertensive nephropathy  Chronic, well controlled. She will continue with current meds. She was given refill of her medication.   3. Chronic renal disease, stage II  Chronic, yet stable. She is encouraged to stay well hydrated.   4. Adjustment disorder with anxious mood  Understandably, she is feeling anxious at the moment. She and I discussed the possibility of therapy to help her through this. She is also grieving the recent loss of her Mother. I will refer her for counseling when she is ready.   5. Immunization due  Rx Prevnar-13 was sent to her pharmacy.   6. Drug therapy  - Liver Profile   Maximino Greenland, MD    THE PATIENT IS ENCOURAGED TO PRACTICE SOCIAL DISTANCING DUE TO THE COVID-19 PANDEMIC.

## 2019-05-05 ENCOUNTER — Other Ambulatory Visit: Payer: Self-pay | Admitting: Internal Medicine

## 2019-05-18 DIAGNOSIS — Z23 Encounter for immunization: Secondary | ICD-10-CM | POA: Diagnosis not present

## 2019-06-10 DIAGNOSIS — Z23 Encounter for immunization: Secondary | ICD-10-CM | POA: Diagnosis not present

## 2019-09-01 ENCOUNTER — Other Ambulatory Visit: Payer: Self-pay | Admitting: Internal Medicine

## 2019-09-01 ENCOUNTER — Encounter: Payer: Self-pay | Admitting: Internal Medicine

## 2019-09-01 MED ORDER — MECLIZINE HCL 25 MG PO TABS: 25.0000 mg | ORAL_TABLET | Freq: Three times a day (TID) | ORAL | 0 refills | Status: AC | PRN

## 2019-09-08 ENCOUNTER — Other Ambulatory Visit: Payer: Self-pay

## 2019-09-08 ENCOUNTER — Ambulatory Visit (INDEPENDENT_AMBULATORY_CARE_PROVIDER_SITE_OTHER): Payer: Medicare Other | Admitting: Internal Medicine

## 2019-09-08 ENCOUNTER — Encounter: Payer: Self-pay | Admitting: Internal Medicine

## 2019-09-08 VITALS — Temp 98.3°F | Ht 65.2 in | Wt 177.6 lb

## 2019-09-08 DIAGNOSIS — I951 Orthostatic hypotension: Secondary | ICD-10-CM | POA: Diagnosis not present

## 2019-09-08 DIAGNOSIS — H8110 Benign paroxysmal vertigo, unspecified ear: Secondary | ICD-10-CM | POA: Diagnosis not present

## 2019-09-08 NOTE — Patient Instructions (Signed)
Benign Positional Vertigo Vertigo is the feeling that you or your surroundings are moving when they are not. Benign positional vertigo is the most common form of vertigo. This is usually a harmless condition (benign). This condition is positional. This means that symptoms are triggered by certain movements and positions. This condition can be dangerous if it occurs while you are doing something that could cause harm to you or others. This includes activities such as driving or operating machinery. What are the causes? In many cases, the cause of this condition is not known. It may be caused by a disturbance in an area of the inner ear that helps your brain to sense movement and balance. This disturbance can be caused by:  Viral infection (labyrinthitis).  Head injury.  Repetitive motion, such as jumping, dancing, or running. What increases the risk? You are more likely to develop this condition if:  You are a woman.  You are 50 years of age or older. What are the signs or symptoms? Symptoms of this condition usually happen when you move your head or your eyes in different directions. Symptoms may start suddenly, and usually last for less than a minute. They include:  Loss of balance and falling.  Feeling like you are spinning or moving.  Feeling like your surroundings are spinning or moving.  Nausea and vomiting.  Blurred vision.  Dizziness.  Involuntary eye movement (nystagmus). Symptoms can be mild and cause only minor problems, or they can be severe and interfere with daily life. Episodes of benign positional vertigo may return (recur) over time. Symptoms may improve over time. How is this diagnosed? This condition may be diagnosed based on:  Your medical history.  Physical exam of the head, neck, and ears.  Tests, such as: ? MRI. ? CT scan. ? Eye movement tests. Your health care provider may ask you to change positions quickly while he or she watches you for symptoms  of benign positional vertigo, such as nystagmus. Eye movement may be tested with a variety of exams that are designed to evaluate or stimulate vertigo. ? An electroencephalogram (EEG). This records electrical activity in your brain. ? Hearing tests. You may be referred to a health care provider who specializes in ear, nose, and throat (ENT) problems (otolaryngologist) or a provider who specializes in disorders of the nervous system (neurologist). How is this treated?  This condition may be treated in a session in which your health care provider moves your head in specific positions to adjust your inner ear back to normal. Treatment for this condition may take several sessions. Surgery may be needed in severe cases, but this is rare. In some cases, benign positional vertigo may resolve on its own in 2-4 weeks. Follow these instructions at home: Safety  Move slowly. Avoid sudden body or head movements or certain positions, as told by your health care provider.  Avoid driving until your health care provider says it is safe for you to do so.  Avoid operating heavy machinery until your health care provider says it is safe for you to do so.  Avoid doing any tasks that would be dangerous to you or others if vertigo occurs.  If you have trouble walking or keeping your balance, try using a cane for stability. If you feel dizzy or unstable, sit down right away.  Return to your normal activities as told by your health care provider. Ask your health care provider what activities are safe for you. General instructions  Take over-the-counter   and prescription medicines only as told by your health care provider.  Drink enough fluid to keep your urine pale yellow.  Keep all follow-up visits as told by your health care provider. This is important. Contact a health care provider if:  You have a fever.  Your condition gets worse or you develop new symptoms.  Your family or friends notice any  behavioral changes.  You have nausea or vomiting that gets worse.  You have numbness or a "pins and needles" sensation. Get help right away if you:  Have difficulty speaking or moving.  Are always dizzy.  Faint.  Develop severe headaches.  Have weakness in your legs or arms.  Have changes in your hearing or vision.  Develop a stiff neck.  Develop sensitivity to light. Summary  Vertigo is the feeling that you or your surroundings are moving when they are not. Benign positional vertigo is the most common form of vertigo.  The cause of this condition is not known. It may be caused by a disturbance in an area of the inner ear that helps your brain to sense movement and balance.  Symptoms include loss of balance and falling, feeling that you or your surroundings are moving, nausea and vomiting, and blurred vision.  This condition can be diagnosed based on symptoms, physical exam, and other tests, such as MRI, CT scan, eye movement tests, and hearing tests.  Follow safety instructions as told by your health care provider. You will also be told when to contact your health care provider in case of problems. This information is not intended to replace advice given to you by your health care provider. Make sure you discuss any questions you have with your health care provider. Document Revised: 09/17/2017 Document Reviewed: 09/17/2017 Elsevier Patient Education  2020 [employer] Inc.  

## 2019-09-08 NOTE — Progress Notes (Signed)
This visit occurred during the SARS-CoV-2 public health emergency.  Safety protocols were in place, including screening questions prior to the visit, additional usage of staff PPE, and extensive cleaning of exam room while observing appropriate contact time as indicated for disinfecting solutions.  Subjective:     Patient ID: Morgan Norman , female    DOB: 06-Jun-1955 , 64 y.o.   MRN: [id number]   Chief Complaint  Patient presents with  . Dizziness    HPI  She presents today for further evaluation of dizziness. States her sx started on 5/9 when getting out of the bed. She did feel like the room was spinning. She has taken meclizine with short-lived relief. She is concerned b/c her sx have persisted over a week.   Dizziness This is a recurrent problem. The current episode started 1 to 4 weeks ago. The problem occurs daily. Associated symptoms include headaches, a sore throat and vertigo. Pertinent negatives include no myalgias, nausea or neck pain. She has tried position changes and relaxation for the symptoms. The treatment provided mild relief.     Past Medical History:  Diagnosis Date  . Benign hypertensive renal disease   . CKD (chronic kidney disease) stage 2, GFR 60-89 ml/min   . Hypertension   . Migraines   . Overweight   . Pure hypercholesterolemia   . Vitamin D deficiency      Family History  Problem Relation Age of Onset  . Diabetes Mellitus I Mother   . Bladder Cancer Mother   . Dementia Mother   . Alcoholism Father      Current Outpatient Medications:  .  Cholecalciferol 5000 UNITS TABS, Take 5,000 Units by mouth., Disp: , Rfl:  .  diazepam (VALIUM) 5 MG tablet, Take 1 tablet (5 mg total) by mouth every 8 (eight) hours as needed for anxiety or muscle spasms., Disp: 20 tablet, Rfl: 0 .  ELDERBERRY PO, Take 1,000 mg by mouth., Disp: , Rfl:  .  Ginkgo Biloba (GINKOBA PO), Take by mouth., Disp: , Rfl:  .  loratadine (CLARITIN) 10 MG tablet, Take 10 mg by  mouth daily as needed for allergies., Disp: , Rfl:  .  meclizine (ANTIVERT) 25 MG tablet, Take 1 tablet (25 mg total) by mouth 3 (three) times daily as needed for dizziness., Disp: 30 tablet, Rfl: 0 .  Omega-3 Fatty Acids (FISH OIL PO), Take 120 mg by mouth., Disp: , Rfl:  .  rosuvastatin (CRESTOR) 10 MG tablet, Take 1 tablet (10 mg total) by mouth daily., Disp: 90 tablet, Rfl: 0 .  telmisartan (MICARDIS) 20 MG tablet, TAKE 1 TABLET BY MOUTH EVERY DAY, Disp: 90 tablet, Rfl: 1   No Known Allergies   Review of Systems  Constitutional: Negative.   HENT: Positive for sore throat.        Left ear feels "funny". Does not feel like she is in a tunnel. No hearing deficit.   Respiratory: Negative.   Cardiovascular: Negative.   Gastrointestinal: Negative.  Negative for nausea.  Musculoskeletal: Negative for myalgias and neck pain.  Neurological: Positive for dizziness, vertigo and headaches.  Psychiatric/Behavioral: Negative.      Today's Vitals   09/08/19 0859  Temp: 98.3 F (36.8 C)  Weight: 177 lb 9.6 oz (80.6 kg)  Height: 5' 5.2" (1.656 m)   Body mass index is 29.37 kg/m.   Objective:  Physical Exam Vitals and nursing note reviewed.  Constitutional:      Appearance: Normal appearance.  HENT:  Head: Normocephalic and atraumatic.     Right Ear: Hearing, ear canal and external ear normal. There is no impacted cerumen.     Left Ear: Hearing, tympanic membrane, ear canal and external ear normal. There is no impacted cerumen.     Ears:     Comments: Fluid in r ear Cardiovascular:     Rate and Rhythm: Normal rate and regular rhythm.     Heart sounds: Normal heart sounds.  Pulmonary:     Effort: Pulmonary effort is normal.     Breath sounds: Normal breath sounds.  Skin:    General: Skin is warm.  Neurological:     General: No focal deficit present.     Mental Status: She is alert.  Psychiatric:        Mood and Affect: Mood normal.        Behavior: Behavior normal.          Assessment And Plan:     1. Benign paroxysmal positional vertigo, unspecified laterality  Acute exacerbation. She was given samples of Norel AD to take twice daily for next two days. Encouraged to stay well hydrated. She will hold loratadine while on this medication. She will continue with meclizine prn.   2. Orthostatic hypotension  She is encouraged to stay well hydrated. Pt advised of change in BP with positional changes. She admits to poor fluid intake. She agrees to f/u in one week. I will not change BP meds at this time.    Maximino Greenland, MD    THE PATIENT IS ENCOURAGED TO PRACTICE SOCIAL DISTANCING DUE TO THE COVID-19 PANDEMIC.

## 2019-09-09 ENCOUNTER — Encounter: Payer: Self-pay | Admitting: Internal Medicine

## 2019-09-16 ENCOUNTER — Other Ambulatory Visit: Payer: Self-pay

## 2019-09-16 ENCOUNTER — Encounter: Payer: Self-pay | Admitting: Internal Medicine

## 2019-09-16 ENCOUNTER — Ambulatory Visit (INDEPENDENT_AMBULATORY_CARE_PROVIDER_SITE_OTHER): Payer: Medicare Other | Admitting: Internal Medicine

## 2019-09-16 VITALS — BP 114/82 | HR 68 | Temp 98.0°F | Ht 64.4 in | Wt 176.0 lb

## 2019-09-16 DIAGNOSIS — I129 Hypertensive chronic kidney disease with stage 1 through stage 4 chronic kidney disease, or unspecified chronic kidney disease: Secondary | ICD-10-CM

## 2019-09-16 DIAGNOSIS — N182 Chronic kidney disease, stage 2 (mild): Secondary | ICD-10-CM | POA: Diagnosis not present

## 2019-09-16 DIAGNOSIS — H8110 Benign paroxysmal vertigo, unspecified ear: Secondary | ICD-10-CM | POA: Diagnosis not present

## 2019-09-16 NOTE — Progress Notes (Signed)
This visit occurred during the SARS-CoV-2 public health emergency.  Safety protocols were in place, including screening questions prior to the visit, additional usage of staff PPE, and extensive cleaning of exam room while observing appropriate contact time as indicated for disinfecting solutions.  Subjective:     Patient ID: Morgan Norman , female    DOB: Dec 28, 1955 , 64 y.o.   MRN: [id number]   Chief Complaint  Patient presents with  . Hypertension    HPI  She presents today for f/u vertigo. She reports that she is finally feeling much better.   Hypertension This is a chronic problem. The problem has been gradually improving since onset. The problem is controlled.     Past Medical History:  Diagnosis Date  . Benign hypertensive renal disease   . CKD (chronic kidney disease) stage 2, GFR 60-89 ml/min   . Hypertension   . Migraines   . Overweight   . Pure hypercholesterolemia   . Vitamin D deficiency      Family History  Problem Relation Age of Onset  . Diabetes Mellitus I Mother   . Bladder Cancer Mother   . Dementia Mother   . Alcoholism Father      Current Outpatient Medications:  .  Cholecalciferol 5000 UNITS TABS, Take 5,000 Units by mouth., Disp: , Rfl:  .  ELDERBERRY PO, Take 1,000 mg by mouth., Disp: , Rfl:  .  Ginkgo Biloba (GINKOBA PO), Take by mouth., Disp: , Rfl:  .  loratadine (CLARITIN) 10 MG tablet, Take 10 mg by mouth daily as needed for allergies., Disp: , Rfl:  .  rosuvastatin (CRESTOR) 10 MG tablet, Take 1 tablet (10 mg total) by mouth daily., Disp: 90 tablet, Rfl: 0 .  telmisartan (MICARDIS) 20 MG tablet, TAKE 1 TABLET BY MOUTH EVERY DAY, Disp: 90 tablet, Rfl: 1 .  diazepam (VALIUM) 5 MG tablet, Take 1 tablet (5 mg total) by mouth every 8 (eight) hours as needed for anxiety or muscle spasms. (Patient not taking: Reported on 09/16/2019), Disp: 20 tablet, Rfl: 0 .  meclizine (ANTIVERT) 25 MG tablet, Take 1 tablet (25 mg total) by mouth 3  (three) times daily as needed for dizziness. (Patient not taking: Reported on 09/16/2019), Disp: 30 tablet, Rfl: 0 .  Omega-3 Fatty Acids (FISH OIL PO), Take 120 mg by mouth., Disp: , Rfl:    No Known Allergies   Review of Systems  Constitutional: Negative.   Respiratory: Negative.   Cardiovascular: Negative.   Gastrointestinal: Negative.   Neurological: Negative.   Psychiatric/Behavioral: Negative.      Today's Vitals   09/16/19 1154  BP: 114/82  Pulse: 68  Temp: 98 F (36.7 C)  TempSrc: Oral  Weight: 176 lb (79.8 kg)  Height: 5' 4.4" (1.636 m)  PainSc: 0-No pain   Body mass index is 29.84 kg/m.   Objective:  Physical Exam Vitals and nursing note reviewed.  Constitutional:      Appearance: Normal appearance.  HENT:     Head: Normocephalic and atraumatic.  Cardiovascular:     Rate and Rhythm: Normal rate and regular rhythm.     Heart sounds: Normal heart sounds.  Pulmonary:     Effort: Pulmonary effort is normal.     Breath sounds: Normal breath sounds.  Skin:    General: Skin is warm.  Neurological:     General: No focal deficit present.     Mental Status: She is alert.  Psychiatric:  Mood and Affect: Mood normal.        Behavior: Behavior normal.         Assessment And Plan:     1. Benign paroxysmal positional vertigo, unspecified laterality  Resolved. She is encouraged to pay attention to what may trigger her symptoms. She will take meclizine prn. She will also take Norel AD prn.   2. Hypertensive nephropathy  Chronic, well controlled. She will continue with current meds.   3. Chronic renal disease, stage II  She is encouraged to stay well hydrated.   [name], MD    THE PATIENT IS ENCOURAGED TO PRACTICE SOCIAL DISTANCING DUE TO THE COVID-19 PANDEMIC.

## 2019-10-07 ENCOUNTER — Other Ambulatory Visit: Payer: Self-pay

## 2019-10-07 ENCOUNTER — Ambulatory Visit (INDEPENDENT_AMBULATORY_CARE_PROVIDER_SITE_OTHER): Payer: Medicare Other

## 2019-10-07 ENCOUNTER — Telehealth: Payer: Self-pay

## 2019-10-07 VITALS — BP 131/89 | HR 67 | Temp 97.9°F | Ht 65.0 in | Wt 175.0 lb

## 2019-10-07 DIAGNOSIS — E7800 Pure hypercholesterolemia, unspecified: Secondary | ICD-10-CM

## 2019-10-07 DIAGNOSIS — Z0000 Encounter for general adult medical examination without abnormal findings: Secondary | ICD-10-CM | POA: Diagnosis not present

## 2019-10-07 DIAGNOSIS — Z23 Encounter for immunization: Secondary | ICD-10-CM | POA: Diagnosis not present

## 2019-10-07 DIAGNOSIS — E559 Vitamin D deficiency, unspecified: Secondary | ICD-10-CM | POA: Diagnosis not present

## 2019-10-07 DIAGNOSIS — R7309 Other abnormal glucose: Secondary | ICD-10-CM | POA: Diagnosis not present

## 2019-10-07 MED ORDER — PREVNAR 13 IM SUSP: 0.5000 mL | INTRAMUSCULAR | 0 refills | Status: AC

## 2019-10-07 NOTE — Patient Instructions (Signed)
Morgan Norman , Thank you for taking time to come for your Medicare Wellness Visit. I appreciate your ongoing commitment to your health goals. Please review the following plan we discussed and let me know if I can assist you in the future.   Screening recommendations/referrals: Colonoscopy: completed 04/01/2018. Due 04/01/2028 Mammogram: completed 01/29/2019. Due 01/29/2020 Bone Density: completed 09/24/2017 Recommended yearly ophthalmology/optometry visit for glaucoma screening and checkup Recommended yearly dental visit for hygiene and checkup  Vaccinations: Influenza vaccine: completed 01/29/2019. Due 01/29/2020 Pneumococcal vaccine: sent to pharmacy Tdap vaccine: completed 10/19/2013. Due 10/20/2023 Shingles vaccine: discussed   Covid-19:06/10/2019, 05/18/2019  Advanced directives: Please bring a copy of your POA (Power of Attorney) and/or Living Will to your next appointment.    Conditions/risks identified: overweight  Next appointment: 10/12/2019 at 11:00  Follow up in one year for your annual wellness visit    Preventive Care 65 Years and Older, Female Preventive care refers to lifestyle choices and visits with your health care provider that can promote health and wellness. What does preventive care include?  A yearly physical exam. This is also called an annual well check.  Dental exams once or twice a year.  Routine eye exams. Ask your health care provider how often you should have your eyes checked.  Personal lifestyle choices, including:  Daily care of your teeth and gums.  Regular physical activity.  Eating a healthy diet.  Avoiding tobacco and drug use.  Limiting alcohol use.  Practicing safe sex.  Taking low-dose aspirin every day.  Taking vitamin and mineral supplements as recommended by your health care provider. What happens during an annual well check? The services and screenings done by your health care provider during your annual well check will  depend on your age, overall health, lifestyle risk factors, and family history of disease. Counseling  Your health care provider may ask you questions about your:  Alcohol use.  Tobacco use.  Drug use.  Emotional well-being.  Home and relationship well-being.  Sexual activity.  Eating habits.  History of falls.  Memory and ability to understand (cognition).  Work and work Astronomer.  Reproductive health. Screening  You may have the following tests or measurements:  Height, weight, and BMI.  Blood pressure.  Lipid and cholesterol levels. These may be checked every 5 years, or more frequently if you are over 68 years old.  Skin check.  Lung cancer screening. You may have this screening every year starting at age 41 if you have a 30-pack-year history of smoking and currently smoke or have quit within the past 15 years.  Fecal occult blood test (FOBT) of the stool. You may have this test every year starting at age 49.  Flexible sigmoidoscopy or colonoscopy. You may have a sigmoidoscopy every 5 years or a colonoscopy every 10 years starting at age 56.  Hepatitis C blood test.  Hepatitis B blood test.  Sexually transmitted disease (STD) testing.  Diabetes screening. This is done by checking your blood sugar (glucose) after you have not eaten for a while (fasting). You may have this done every 1-3 years.  Bone density scan. This is done to screen for osteoporosis. You may have this done starting at age 105.  Mammogram. This may be done every 1-2 years. Talk to your health care provider about how often you should have regular mammograms. Talk with your health care provider about your test results, treatment options, and if necessary, the need for more tests. Vaccines  Your health care provider  may recommend certain vaccines, such as:  Influenza vaccine. This is recommended every year.  Tetanus, diphtheria, and acellular pertussis (Tdap, Td) vaccine. You may need a  Td booster every 10 years.  Zoster vaccine. You may need this after age 64.  Pneumococcal 13-valent conjugate (PCV13) vaccine. One dose is recommended after age 50.  Pneumococcal polysaccharide (PPSV23) vaccine. One dose is recommended after age 67. Talk to your health care provider about which screenings and vaccines you need and how often you need them. This information is not intended to replace advice given to you by your health care provider. Make sure you discuss any questions you have with your health care provider. Document Released: 05/05/2015 Document Revised: 12/27/2015 Document Reviewed: 02/07/2015 Elsevier Interactive Patient Education  2017 [place] Prevention in the Home Falls can cause injuries. They can happen to people of all ages. There are many things you can do to make your home safe and to help prevent falls. What can I do on the outside of my home?  Regularly fix the edges of walkways and driveways and fix any cracks.  Remove anything that might make you trip as you walk through a door, such as a raised step or threshold.  Trim any bushes or trees on the path to your home.  Use bright outdoor lighting.  Clear any walking paths of anything that might make someone trip, such as rocks or tools.  Regularly check to see if handrails are loose or broken. Make sure that both sides of any steps have handrails.  Any raised decks and porches should have guardrails on the edges.  Have any leaves, snow, or ice cleared regularly.  Use sand or salt on walking paths during winter.  Clean up any spills in your garage right away. This includes oil or grease spills. What can I do in the bathroom?  Use night lights.  Install grab bars by the toilet and in the tub and shower. Do not use towel bars as grab bars.  Use non-skid mats or decals in the tub or shower.  If you need to sit down in the shower, use a plastic, non-slip stool.  Keep the floor dry. Clean  up any water that spills on the floor as soon as it happens.  Remove soap buildup in the tub or shower regularly.  Attach bath mats securely with double-sided non-slip rug tape.  Do not have throw rugs and other things on the floor that can make you trip. What can I do in the bedroom?  Use night lights.  Make sure that you have a light by your bed that is easy to reach.  Do not use any sheets or blankets that are too big for your bed. They should not hang down onto the floor.  Have a firm chair that has side arms. You can use this for support while you get dressed.  Do not have throw rugs and other things on the floor that can make you trip. What can I do in the kitchen?  Clean up any spills right away.  Avoid walking on wet floors.  Keep items that you use a lot in easy-to-reach places.  If you need to reach something above you, use a strong step stool that has a grab bar.  Keep electrical cords out of the way.  Do not use floor polish or wax that makes floors slippery. If you must use wax, use non-skid floor wax.  Do not have throw rugs  and other things on the floor that can make you trip. What can I do with my stairs?  Do not leave any items on the stairs.  Make sure that there are handrails on both sides of the stairs and use them. Fix handrails that are broken or loose. Make sure that handrails are as long as the stairways.  Check any carpeting to make sure that it is firmly attached to the stairs. Fix any carpet that is loose or worn.  Avoid having throw rugs at the top or bottom of the stairs. If you do have throw rugs, attach them to the floor with carpet tape.  Make sure that you have a light switch at the top of the stairs and the bottom of the stairs. If you do not have them, ask someone to add them for you. What else can I do to help prevent falls?  Wear shoes that:  Do not have high heels.  Have rubber bottoms.  Are comfortable and fit you well.  Are  closed at the toe. Do not wear sandals.  If you use a stepladder:  Make sure that it is fully opened. Do not climb a closed stepladder.  Make sure that both sides of the stepladder are locked into place.  Ask someone to hold it for you, if possible.  Clearly mark and make sure that you can see:  Any grab bars or handrails.  First and last steps.  Where the edge of each step is.  Use tools that help you move around (mobility aids) if they are needed. These include:  Canes.  Walkers.  Scooters.  Crutches.  Turn on the lights when you go into a dark area. Replace any light bulbs as soon as they burn out.  Set up your furniture so you have a clear path. Avoid moving your furniture around.  If any of your floors are uneven, fix them.  If there are any pets around you, be aware of where they are.  Review your medicines with your doctor. Some medicines can make you feel dizzy. This can increase your chance of falling. Ask your doctor what other things that you can do to help prevent falls. This information is not intended to replace advice given to you by your health care provider. Make sure you discuss any questions you have with your health care provider. Document Released: 02/02/2009 Document Revised: 09/14/2015 Document Reviewed: 05/13/2014 Elsevier Interactive Patient Education  2017 [employer].

## 2019-10-07 NOTE — Telephone Encounter (Signed)
Patient consented to video visit. Two identifiers were used to verify the patient.

## 2019-10-07 NOTE — Progress Notes (Signed)
I connected with Morgan Norman today by video and verified that I am speaking with the correct person using two identifiers. Location patient: home Location provider: work Persons participating in the virtual visit: Marinda Elk, Elisha Ponder LPN.   I discussed the limitations, risks, security and privacy concerns of performing an evaluation and management service by video and the availability of in person appointments. I also discussed with the patient that there may be a patient responsible charge related to this service. The patient expressed understanding and verbally consented to this video visit.    Some vital signs may be patient reported or missing.       Subjective:   Morgan Norman is a 64 y.o. female who presents for Medicare Annual (Subsequent) preventive examination.  Review of Systems:  n/a Cardiac Risk Factors include: advanced age (>42men, >63 women);hypertension     Objective:     Vitals: BP 131/89 (Patient Position: Sitting) Comment: per patient  Pulse 67 Comment: per patient  Temp 97.9 F (36.6 C) Comment: per patient  Ht 5\' 5"  (1.651 m) Comment: per patient  Wt 175 lb (79.4 kg) Comment: per patient  BMI 29.12 kg/m   Body mass index is 29.12 kg/m.  Advanced Directives 10/07/2019 10/06/2018  Does Patient Have a Medical Advance Directive? Yes Yes  Type of 10/08/2018 of [place];Living will Living will  Does patient want to make changes to medical advance directive? - No - Patient declined  Copy of Healthcare Power of Attorney in Chart? No - copy requested -    Tobacco Social History   Tobacco Use  Smoking Status Never Smoker  Smokeless Tobacco Never Used     Counseling given: Not Answered   Clinical Intake:  Pre-visit preparation completed: Yes  Pain : No/denies pain     Nutritional Status: BMI 25 -29 Overweight Nutritional Risks: None Diabetes: No  How often do you need to have someone  help you when you read instructions, pamphlets, or other written materials from your doctor or pharmacy?: 1 - Never What is the last grade level you completed in school?: college  Interpreter Needed?: No  Information entered by :: NAllen LPN  Past Medical History:  Diagnosis Date  . Benign hypertensive renal disease   . CKD (chronic kidney disease) stage 2, GFR 60-89 ml/min   . Hypertension   . Migraines   . Overweight   . Pure hypercholesterolemia   . Vitamin D deficiency    Past Surgical History:  Procedure Laterality Date  . bilateral foot surgery    . CESAREAN SECTION    . TOTAL ABDOMINAL HYSTERECTOMY W/ BILATERAL SALPINGOOPHORECTOMY     Family History  Problem Relation Age of Onset  . Diabetes Mellitus I Mother   . Bladder Cancer Mother   . Dementia Mother   . Alcoholism Father    Social History   Socioeconomic History  . Marital status: Married    Spouse name: Not on file  . Number of children: Not on file  . Years of education: Not on file  . Highest education level: Not on file  Occupational History  . Occupation: retired  Tobacco Use  . Smoking status: Never Smoker  . Smokeless tobacco: Never Used  Vaping Use  . Vaping Use: Never used  Substance and Sexual Activity  . Alcohol use: Not Currently    Alcohol/week: 0.0 standard drinks    Comment: rarely  . Drug use: No  . Sexual activity: Yes  Other Topics  Concern  . Not on file  Social History Narrative   Drinks 2 cups of coffee daily.   Social Determinants of Health   Financial Resource Strain: Low Risk   . Difficulty of Paying Living Expenses: Not hard at all  Food Insecurity: No Food Insecurity  . Worried About Charity fundraiser in the Last Year: Never true  . Ran Out of Food in the Last Year: Never true  Transportation Needs: No Transportation Needs  . Lack of Transportation (Medical): No  . Lack of Transportation (Non-Medical): No  Physical Activity: Sufficiently Active  . Days of  Exercise per Week: 5 days  . Minutes of Exercise per Session: 60 min  Stress: No Stress Concern Present  . Feeling of Stress : Not at all  Social Connections:   . Frequency of Communication with Friends and Family:   . Frequency of Social Gatherings with Friends and Family:   . Attends Religious Services:   . Active Member of Clubs or Organizations:   . Attends Archivist Meetings:   Marland Kitchen Marital Status:     Outpatient Encounter Medications as of 10/07/2019  Medication Sig  . Cholecalciferol 5000 UNITS TABS Take 5,000 Units by mouth.  . ELDERBERRY PO Take 1,000 mg by mouth.  . Ginkgo Biloba (GINKOBA PO) Take by mouth.  . loratadine (CLARITIN) 10 MG tablet Take 10 mg by mouth daily as needed for allergies.  Marland Kitchen meclizine (ANTIVERT) 25 MG tablet Take 1 tablet (25 mg total) by mouth 3 (three) times daily as needed for dizziness.  . Omega-3 Fatty Acids (FISH OIL PO) Take 120 mg by mouth.  . rosuvastatin (CRESTOR) 10 MG tablet Take 1 tablet (10 mg total) by mouth daily.  Marland Kitchen telmisartan (MICARDIS) 20 MG tablet TAKE 1 TABLET BY MOUTH EVERY DAY  . diazepam (VALIUM) 5 MG tablet Take 1 tablet (5 mg total) by mouth every 8 (eight) hours as needed for anxiety or muscle spasms. (Patient not taking: Reported on 09/16/2019)  . pneumococcal 13-valent conjugate vaccine (PREVNAR 13) SUSP injection Inject 0.5 mLs into the muscle tomorrow at 10 am for 1 dose.   No facility-administered encounter medications on file as of 10/07/2019.    Activities of Daily Living In your present state of health, do you have any difficulty performing the following activities: 10/07/2019  Hearing? N  Vision? N  Difficulty concentrating or making decisions? N  Walking or climbing stairs? N  Dressing or bathing? N  Doing errands, shopping? N  Preparing Food and eating ? N  Using the Toilet? N  In the past six months, have you accidently leaked urine? Y  Comment on rare occassions  Do you have problems with loss of  bowel control? N  Managing your Medications? N  Managing your Finances? N  Housekeeping or managing your Housekeeping? N  Some recent data might be hidden    Patient Care Team: Glendale Chard, MD as PCP - General (Internal Medicine)    Assessment:   This is a routine wellness examination for [facility].  Exercise Activities and Dietary recommendations Current Exercise Habits: Home exercise routine, Type of exercise: walking, Time (Minutes): 60, Frequency (Times/Week): 5, Weekly Exercise (Minutes/Week): 300  Goals    . Exercise 150 min/wk Moderate Activity    . Patient Stated     10/07/2019, wants to get off BP and cholesterol meds, by losing weight (wants to get down to 160 pounds)    . Weight (lb) < 160 lb (72.6 kg)  Fall Risk Fall Risk  10/07/2019 01/13/2019 01/13/2019 01/13/2019 12/17/2018  Falls in the past year? 0 0 1 0 0  Number falls in past yr: - - 1 - -  Injury with Fall? - - 0 - -  Risk for fall due to : Medication side effect - - - -  Follow up Falls evaluation completed;Education provided;Falls prevention discussed - - - -   Is the patient's home free of loose throw rugs in walkways, pet beds, electrical cords, etc?   yes      Grab bars in the bathroom? no      Handrails on the stairs?   yes      Adequate lighting?   yes  Timed Get Up and Go performed: n/a  Depression Screen PHQ 2/9 Scores 10/07/2019 01/13/2019 10/06/2018 03/26/2018  PHQ - 2 Score 0 0 0 1  PHQ- 9 Score 0 - - 4     Cognitive Function     6CIT Screen 10/07/2019 10/06/2018  What Year? 0 points 0 points  What month? 0 points 0 points  What time? 0 points 0 points  Count back from 20 0 points 0 points  Months in reverse 0 points 0 points  Repeat phrase 0 points 0 points  Total Score 0 0    Immunization History  Administered Date(s) Administered  . DTaP 10/19/2013  . Fluad Quad(high Dose 65+) 01/25/2019  . Influenza,inj,Quad PF,6+ Mos 06/23/2018  . PFIZER SARS-COV-2 Vaccination 05/18/2019,  06/10/2019  . Pneumococcal Polysaccharide-23 12/05/2014  . Td 08/09/2005    Qualifies for Shingles Vaccine? yes  Screening Tests Health Maintenance  Topic Date Due  . PNA vac Low Risk Adult (1 of 2 - PCV13) 09/05/2018  . INFLUENZA VACCINE  11/21/2019  . MAMMOGRAM  01/28/2021  . TETANUS/TDAP  10/20/2023  . COLONOSCOPY  04/01/2028  . DEXA SCAN  Completed  . COVID-19 Vaccine  Completed  . Hepatitis C Screening  Completed    Cancer Screenings: Lung: Low Dose CT Chest recommended if Age 70-80 years, 30 pack-year currently smoking OR have quit w/in 15years. Patient does not qualify. Breast:  Up to date on Mammogram? Yes   Up to date of Bone Density/Dexa? Yes Colorectal: up to date  Additional Screenings: : Hepatitis C Screening: 08/24/2012     Plan:    Patient wants to get off of BP and cholesterol medications. Plans to do this by losing weight. Goal weight is 160 pounds.   I have personally reviewed and noted the following in the patient's chart:   . Medical and social history . Use of alcohol, tobacco or illicit drugs  . Current medications and supplements . Functional ability and status . Nutritional status . Physical activity . Advanced directives . List of other physicians . Hospitalizations, surgeries, and ER visits in previous 12 months . Vitals . Screenings to include cognitive, depression, and falls . Referrals and appointments  In addition, I have reviewed and discussed with patient certain preventive protocols, quality metrics, and best practice recommendations. A written personalized care plan for preventive services as well as general preventive health recommendations were provided to patient.     Barb Merino, LPN  [id number]

## 2019-10-08 LAB — LIPID PANEL
Chol/HDL Ratio: 2.8 ratio (ref 0.0–4.4)
Cholesterol, Total: 153 mg/dL (ref 100–199)
HDL: 54 mg/dL (ref >39)
LDL Chol Calc (NIH): 86 mg/dL (ref 0–99)
Triglycerides: 66 mg/dL (ref 0–149)
VLDL Cholesterol Cal: 13 mg/dL (ref 5–40)

## 2019-10-08 LAB — CBC WITH DIFFERENTIAL/PLATELET
Basophils Absolute: 0 10*3/uL (ref 0.0–0.2)
Basos: 1 %
EOS (ABSOLUTE): 0.1 10*3/uL (ref 0.0–0.4)
Eos: 3 %
Hematocrit: 39.1 % (ref 34.0–46.6)
Hemoglobin: 12.9 g/dL (ref 11.1–15.9)
Immature Grans (Abs): 0 10*3/uL (ref 0.0–0.1)
Immature Granulocytes: 0 %
Lymphocytes Absolute: 1.5 10*3/uL (ref 0.7–3.1)
Lymphs: 37 %
MCH: 29.8 pg (ref 26.6–33.0)
MCHC: 33 g/dL (ref 31.5–35.7)
MCV: 90 fL (ref 79–97)
Monocytes Absolute: 0.4 10*3/uL (ref 0.1–0.9)
Monocytes: 9 %
Neutrophils Absolute: 2 10*3/uL (ref 1.4–7.0)
Neutrophils: 50 %
Platelets: 212 10*3/uL (ref 150–450)
RBC: 4.33 x10E6/uL (ref 3.77–5.28)
RDW: 12.4 % (ref 11.7–15.4)
WBC: 4 10*3/uL (ref 3.4–10.8)

## 2019-10-08 LAB — CMP14+EGFR
ALT: 13 IU/L (ref 0–32)
AST: 22 IU/L (ref 0–40)
Albumin/Globulin Ratio: 1.7 (ref 1.2–2.2)
Albumin: 4.4 g/dL (ref 3.8–4.8)
Alkaline Phosphatase: 80 IU/L (ref 48–121)
BUN/Creatinine Ratio: 12 (ref 12–28)
BUN: 10 mg/dL (ref 8–27)
Bilirubin Total: 0.3 mg/dL (ref 0.0–1.2)
CO2: 27 mmol/L (ref 20–29)
Calcium: 9.1 mg/dL (ref 8.7–10.3)
Chloride: 102 mmol/L (ref 96–106)
Creatinine, Ser: 0.85 mg/dL (ref 0.57–1.00)
GFR calc Af Amer: 83 mL/min/{1.73_m2} (ref >59)
GFR calc non Af Amer: 72 mL/min/{1.73_m2} (ref >59)
Globulin, Total: 2.6 g/dL (ref 1.5–4.5)
Glucose: 86 mg/dL (ref 65–99)
Potassium: 4.2 mmol/L (ref 3.5–5.2)
Sodium: 141 mmol/L (ref 134–144)
Total Protein: 7 g/dL (ref 6.0–8.5)

## 2019-10-11 ENCOUNTER — Encounter: Payer: Self-pay | Admitting: Internal Medicine

## 2019-10-12 ENCOUNTER — Ambulatory Visit (INDEPENDENT_AMBULATORY_CARE_PROVIDER_SITE_OTHER): Payer: Medicare Other | Admitting: Internal Medicine

## 2019-10-12 ENCOUNTER — Other Ambulatory Visit: Payer: Self-pay

## 2019-10-12 ENCOUNTER — Encounter: Payer: Self-pay | Admitting: Internal Medicine

## 2019-10-12 ENCOUNTER — Ambulatory Visit: Payer: Medicare Other

## 2019-10-12 VITALS — BP 126/66 | HR 73 | Temp 98.0°F | Ht 63.4 in | Wt 174.6 lb

## 2019-10-12 DIAGNOSIS — Z23 Encounter for immunization: Secondary | ICD-10-CM

## 2019-10-12 DIAGNOSIS — N182 Chronic kidney disease, stage 2 (mild): Secondary | ICD-10-CM

## 2019-10-12 DIAGNOSIS — E7800 Pure hypercholesterolemia, unspecified: Secondary | ICD-10-CM | POA: Diagnosis not present

## 2019-10-12 DIAGNOSIS — R7309 Other abnormal glucose: Secondary | ICD-10-CM

## 2019-10-12 DIAGNOSIS — I129 Hypertensive chronic kidney disease with stage 1 through stage 4 chronic kidney disease, or unspecified chronic kidney disease: Secondary | ICD-10-CM | POA: Diagnosis not present

## 2019-10-12 DIAGNOSIS — E6609 Other obesity due to excess calories: Secondary | ICD-10-CM

## 2019-10-12 DIAGNOSIS — E559 Vitamin D deficiency, unspecified: Secondary | ICD-10-CM | POA: Diagnosis not present

## 2019-10-12 DIAGNOSIS — Z6830 Body mass index (BMI) 30.0-30.9, adult: Secondary | ICD-10-CM

## 2019-10-12 MED ORDER — PNEUMOCOCCAL 13-VAL CONJ VACC IM SUSP: 0.5000 mL | INTRAMUSCULAR | 0 refills | Status: AC

## 2019-10-12 NOTE — Progress Notes (Signed)
This visit occurred during the SARS-CoV-2 public health emergency.  Safety protocols were in place, including screening questions prior to the visit, additional usage of staff PPE, and extensive cleaning of exam room while observing appropriate contact time as indicated for disinfecting solutions.  Subjective:     Patient ID: Morgan Norman , female    DOB: 03/18/1956 , 64 y.o.   MRN: [id number]   Chief Complaint  Patient presents with  . Hypertension    HPI  She is here today for bp check/chol check. She had her labs drawn last week. She has no other concerns or complaints.   Hypertension This is a chronic problem. The current episode started more than 1 year ago. The problem has been gradually improving since onset. The problem is controlled. Pertinent negatives include no blurred vision, chest pain, palpitations or shortness of breath. The current treatment provides moderate improvement.     Past Medical History:  Diagnosis Date  . Benign hypertensive renal disease   . CKD (chronic kidney disease) stage 2, GFR 60-89 ml/min   . Hypertension   . Migraines   . Overweight   . Pure hypercholesterolemia   . Vitamin D deficiency      Family History  Problem Relation Age of Onset  . Diabetes Mellitus I Mother   . Bladder Cancer Mother   . Dementia Mother   . Alcoholism Father      Current Outpatient Medications:  .  Cholecalciferol 5000 UNITS TABS, Take 5,000 Units by mouth., Disp: , Rfl:  .  diazepam (VALIUM) 5 MG tablet, Take 1 tablet (5 mg total) by mouth every 8 (eight) hours as needed for anxiety or muscle spasms., Disp: 20 tablet, Rfl: 0 .  ELDERBERRY PO, Take 1,000 mg by mouth., Disp: , Rfl:  .  Ginkgo Biloba (GINKOBA PO), Take by mouth., Disp: , Rfl:  .  loratadine (CLARITIN) 10 MG tablet, Take 10 mg by mouth daily as needed for allergies., Disp: , Rfl:  .  meclizine (ANTIVERT) 25 MG tablet, Take 1 tablet (25 mg total) by mouth 3 (three) times daily as needed  for dizziness., Disp: 30 tablet, Rfl: 0 .  Omega-3 Fatty Acids (FISH OIL PO), Take 120 mg by mouth., Disp: , Rfl:  .  rosuvastatin (CRESTOR) 10 MG tablet, Take 1 tablet (10 mg total) by mouth daily., Disp: 90 tablet, Rfl: 0 .  telmisartan (MICARDIS) 20 MG tablet, TAKE 1 TABLET BY MOUTH EVERY DAY, Disp: 90 tablet, Rfl: 1   No Known Allergies   Review of Systems  Constitutional: Negative.   Eyes: Negative for blurred vision.  Respiratory: Negative.  Negative for shortness of breath.   Cardiovascular: Negative.  Negative for chest pain and palpitations.  Gastrointestinal: Negative.   Neurological: Negative.   Psychiatric/Behavioral: Negative.      Today's Vitals   10/12/19 1059  BP: 126/66  Pulse: 73  Temp: 98 F (36.7 C)  TempSrc: Oral  Weight: 174 lb 9.6 oz (79.2 kg)  Height: 5' 3.4" (1.61 m)   Body mass index is 30.54 kg/m.   Objective:  Physical Exam Vitals and nursing note reviewed.  Constitutional:      Appearance: Normal appearance.  HENT:     Head: Normocephalic and atraumatic.  Cardiovascular:     Rate and Rhythm: Normal rate and regular rhythm.     Heart sounds: Normal heart sounds.  Pulmonary:     Effort: Pulmonary effort is normal.     Breath sounds: Normal breath  sounds.  Skin:    General: Skin is warm.  Neurological:     General: No focal deficit present.     Mental Status: She is alert.  Psychiatric:        Mood and Affect: Mood normal.        Behavior: Behavior normal.         Assessment And Plan:     1. Hypertensive nephropathy  Chronic, well controlled. She will continue with current meds. She is encouraged to avoid adding salt to her foods. She had her labs drawn last week, I did review all results with her in full detail. Her renal function is stable.   2. Chronic renal disease, stage II  Chronic. She is encouraged to stay well hydrated and keep BP controlled in order to avoid progression of CKD.  3. Vitamin D deficiency  I will add  on vitamin D level and supplement as needed   4. Pure hypercholesterolemia  Chronic. Recent LDL results are at goal. She is encouraged to continue with her regular exercise and current meds.   5. Class 1 obesity due to excess calories with serious comorbidity and body mass index (BMI) of 30.0 to 30.9 in adult  She is encouraged to continue with her current exercise regimen, aiming for at least 150 minutes per week.   [name], MD    THE PATIENT IS ENCOURAGED TO PRACTICE SOCIAL DISTANCING DUE TO THE COVID-19 PANDEMIC.

## 2019-10-12 NOTE — Patient Instructions (Signed)

## 2019-10-15 LAB — SPECIMEN STATUS REPORT

## 2019-10-15 LAB — VITAMIN D 25 HYDROXY (VIT D DEFICIENCY, FRACTURES): Vit D, 25-Hydroxy: 40.6 ng/mL (ref 30.0–100.0)

## 2019-11-05 ENCOUNTER — Other Ambulatory Visit: Payer: Self-pay | Admitting: Internal Medicine

## 2020-01-19 DIAGNOSIS — Z23 Encounter for immunization: Secondary | ICD-10-CM | POA: Diagnosis not present

## 2020-03-15 ENCOUNTER — Encounter: Payer: Self-pay | Admitting: Internal Medicine

## 2020-03-24 DIAGNOSIS — B372 Candidiasis of skin and nail: Secondary | ICD-10-CM | POA: Diagnosis not present

## 2020-04-10 ENCOUNTER — Other Ambulatory Visit: Payer: Self-pay

## 2020-04-10 ENCOUNTER — Encounter: Payer: Self-pay | Admitting: Internal Medicine

## 2020-04-10 ENCOUNTER — Ambulatory Visit (INDEPENDENT_AMBULATORY_CARE_PROVIDER_SITE_OTHER): Payer: Medicare Other | Admitting: Internal Medicine

## 2020-04-10 VITALS — BP 124/78 | HR 77 | Temp 98.1°F | Ht 63.4 in | Wt 166.4 lb

## 2020-04-10 DIAGNOSIS — I129 Hypertensive chronic kidney disease with stage 1 through stage 4 chronic kidney disease, or unspecified chronic kidney disease: Secondary | ICD-10-CM

## 2020-04-10 DIAGNOSIS — Z6829 Body mass index (BMI) 29.0-29.9, adult: Secondary | ICD-10-CM

## 2020-04-10 DIAGNOSIS — R0982 Postnasal drip: Secondary | ICD-10-CM | POA: Diagnosis not present

## 2020-04-10 DIAGNOSIS — N182 Chronic kidney disease, stage 2 (mild): Secondary | ICD-10-CM

## 2020-04-10 DIAGNOSIS — Z23 Encounter for immunization: Secondary | ICD-10-CM | POA: Diagnosis not present

## 2020-04-10 DIAGNOSIS — E7800 Pure hypercholesterolemia, unspecified: Secondary | ICD-10-CM | POA: Diagnosis not present

## 2020-04-10 MED ORDER — PNEUMOCOCCAL 13-VAL CONJ VACC IM SUSP: 0.5000 mL | INTRAMUSCULAR | 0 refills | Status: AC

## 2020-04-10 MED ORDER — SHINGRIX 50 MCG/0.5ML IM SUSR: 0.5000 mL | Freq: Once | INTRAMUSCULAR | 0 refills | Status: AC

## 2020-04-10 NOTE — Progress Notes (Signed)
I,Katawbba Wiggins,acting as a Education administrator for Maximino Greenland, MD.,have documented all relevant documentation on the behalf of Maximino Greenland, MD,as directed by  Maximino Greenland, MD while in the presence of Maximino Greenland, MD.  This visit occurred during the SARS-CoV-2 public health emergency.  Safety protocols were in place, including screening questions prior to the visit, additional usage of staff PPE, and extensive cleaning of exam room while observing appropriate contact time as indicated for disinfecting solutions.  Subjective:     Patient ID: Morgan Norman , female    DOB: June 23, 1955 , 64 y.o.   MRN: [id number]   Chief Complaint  Patient presents with  . Hypertension  . Hyperlipidemia    HPI  She is here today for bp check/chol check. She reports compliance with meds. Admits she has not been exercising regularly.   Hypertension This is a chronic problem. The current episode started more than 1 year ago. The problem has been gradually improving since onset. The problem is controlled. Pertinent negatives include no blurred vision, chest pain, palpitations or shortness of breath. The current treatment provides moderate improvement.     Past Medical History:  Diagnosis Date  . Benign hypertensive renal disease   . CKD (chronic kidney disease) stage 2, GFR 60-89 ml/min   . Hypertension   . Migraines   . Overweight   . Pure hypercholesterolemia   . Vitamin D deficiency      Family History  Problem Relation Age of Onset  . Diabetes Mellitus I Mother   . Bladder Cancer Mother   . Dementia Mother   . Alcoholism Father      Current Outpatient Medications:  .  Cholecalciferol 5000 UNITS TABS, Take 5,000 Units by mouth., Disp: , Rfl:  .  diazepam (VALIUM) 5 MG tablet, Take 1 tablet (5 mg total) by mouth every 8 (eight) hours as needed for anxiety or muscle spasms., Disp: 20 tablet, Rfl: 0 .  ELDERBERRY PO, Take 1,000 mg by mouth., Disp: , Rfl:  .  Ginkgo Biloba  (GINKOBA PO), Take by mouth., Disp: , Rfl:  .  loratadine (CLARITIN) 10 MG tablet, Take 10 mg by mouth daily as needed for allergies., Disp: , Rfl:  .  meclizine (ANTIVERT) 25 MG tablet, Take 1 tablet (25 mg total) by mouth 3 (three) times daily as needed for dizziness., Disp: 30 tablet, Rfl: 0 .  Omega-3 Fatty Acids (FISH OIL PO), Take 120 mg by mouth., Disp: , Rfl:  .  rosuvastatin (CRESTOR) 10 MG tablet, Take 1 tablet (10 mg total) by mouth daily., Disp: 90 tablet, Rfl: 0 .  telmisartan (MICARDIS) 20 MG tablet, TAKE 1 TABLET(20 MG) BY MOUTH DAILY, Disp: 90 tablet, Rfl: 1 .  triamcinolone (KENALOG) 0.1 %, Apply 1 application topically 2 (two) times daily., Disp: , Rfl:  .  nystatin cream (MYCOSTATIN), Apply topically 3 (three) times daily. (Patient not taking: Reported on 04/10/2020), Disp: , Rfl:    No Known Allergies   Review of Systems  Constitutional: Negative.   Eyes: Negative for blurred vision.  Respiratory: Negative.  Negative for shortness of breath.   Cardiovascular: Negative.  Negative for chest pain and palpitations.  Gastrointestinal: Negative.   Psychiatric/Behavioral: Negative.   All other systems reviewed and are negative.    Today's Vitals   04/10/20 1024  BP: 124/78  Pulse: 77  Temp: 98.1 F (36.7 C)  TempSrc: Oral  Weight: 166 lb 6.4 oz (75.5 kg)  Height: 5' 3.4" (1.61  m)  PainSc: 0-No pain   Body mass index is 29.11 kg/m.  Wt Readings from Last 3 Encounters:  04/10/20 166 lb 6.4 oz (75.5 kg)  10/12/19 174 lb 9.6 oz (79.2 kg)  10/07/19 175 lb (79.4 kg)   Objective:  Physical Exam Vitals and nursing note reviewed.  Constitutional:      Appearance: Normal appearance.  HENT:     Head: Normocephalic and atraumatic.  Cardiovascular:     Rate and Rhythm: Normal rate and regular rhythm.     Heart sounds: Normal heart sounds.  Pulmonary:     Breath sounds: Normal breath sounds.  Musculoskeletal:     Cervical back: Normal range of motion.  Skin:     General: Skin is warm.  Neurological:     General: No focal deficit present.     Mental Status: She is alert and oriented to person, place, and time.         Assessment And Plan:     1. Hypertensive nephropathy Comments: Chronic, well controlled. She will continue with current meds. I will check renal function today.  She will f/u in six months for re-evaluation.  - CMP14+EGFR  2. Chronic renal disease, stage II Comments: Chronic, I will check GFR, Cr today. She is encouraged to stay well hydrated.   3. Pure hypercholesterolemia Comments: Chronic, last drawn June 2021. Her LDL was at goal, she will continue with current meds.   4. Postnasal drip Comments: Advised to take loratadine or cetirizine prn. She will let me know if her sx persist.   5. BMI 29.0-29.9,adult Comments: Her BMI is acceptable for her demographic. She is encouraged to incorporate more exercise into her daily routine.   6. Immunization due Comments: I will send rx Prevnar-13 to her local pharmacy. Advised to get Shingrix two weeks later.   Patient was given opportunity to ask questions. Patient verbalized understanding of the plan and was able to repeat key elements of the plan. All questions were answered to their satisfaction.  Maximino Greenland, MD   I, Maximino Greenland, MD, have reviewed all documentation for this visit. The documentation on 04/17/20 for the exam, diagnosis, procedures, and orders are all accurate and complete.  THE PATIENT IS ENCOURAGED TO PRACTICE SOCIAL DISTANCING DUE TO THE COVID-19 PANDEMIC.

## 2020-04-10 NOTE — Patient Instructions (Signed)

## 2020-04-11 LAB — CMP14+EGFR
ALT: 17 IU/L (ref 0–32)
AST: 23 IU/L (ref 0–40)
Albumin/Globulin Ratio: 1.8 (ref 1.2–2.2)
Albumin: 4.9 g/dL — ABNORMAL HIGH (ref 3.8–4.8)
Alkaline Phosphatase: 89 IU/L (ref 44–121)
BUN/Creatinine Ratio: 13 (ref 12–28)
BUN: 11 mg/dL (ref 8–27)
Bilirubin Total: 0.5 mg/dL (ref 0.0–1.2)
CO2: 24 mmol/L (ref 20–29)
Calcium: 9.4 mg/dL (ref 8.7–10.3)
Chloride: 99 mmol/L (ref 96–106)
Creatinine, Ser: 0.87 mg/dL (ref 0.57–1.00)
GFR calc Af Amer: 80 mL/min/{1.73_m2} (ref >59)
GFR calc non Af Amer: 70 mL/min/{1.73_m2} (ref >59)
Globulin, Total: 2.8 g/dL (ref 1.5–4.5)
Glucose: 85 mg/dL (ref 65–99)
Potassium: 4 mmol/L (ref 3.5–5.2)
Sodium: 140 mmol/L (ref 134–144)
Total Protein: 7.7 g/dL (ref 6.0–8.5)

## 2020-04-28 ENCOUNTER — Encounter: Payer: Self-pay | Admitting: Internal Medicine

## 2020-04-28 DIAGNOSIS — Z1231 Encounter for screening mammogram for malignant neoplasm of breast: Secondary | ICD-10-CM | POA: Diagnosis not present

## 2020-04-28 LAB — HM MAMMOGRAPHY

## 2020-04-29 ENCOUNTER — Other Ambulatory Visit: Payer: Self-pay | Admitting: Internal Medicine

## 2020-04-30 ENCOUNTER — Encounter: Payer: Self-pay | Admitting: Internal Medicine

## 2020-05-03 ENCOUNTER — Ambulatory Visit (INDEPENDENT_AMBULATORY_CARE_PROVIDER_SITE_OTHER): Payer: Medicare Other | Admitting: Nurse Practitioner

## 2020-05-03 ENCOUNTER — Other Ambulatory Visit: Payer: Self-pay

## 2020-05-03 VITALS — BP 132/68 | HR 70 | Temp 98.2°F | Ht 65.4 in | Wt 172.0 lb

## 2020-05-03 DIAGNOSIS — G8929 Other chronic pain: Secondary | ICD-10-CM | POA: Diagnosis not present

## 2020-05-03 DIAGNOSIS — M545 Low back pain, unspecified: Secondary | ICD-10-CM

## 2020-05-03 DIAGNOSIS — B379 Candidiasis, unspecified: Secondary | ICD-10-CM

## 2020-05-03 MED ORDER — CYCLOBENZAPRINE HCL 5 MG PO TABS
5.0000 mg | ORAL_TABLET | Freq: Every day | ORAL | 0 refills | Status: DC | PRN
Start: 2020-05-03 — End: 2022-05-27

## 2020-05-03 NOTE — Patient Instructions (Signed)

## 2020-05-03 NOTE — Progress Notes (Signed)
I,Tianna Badgett,acting as a Neurosurgeon for [employer], NP.,have documented all relevant documentation on the behalf of [employer], NP,as directed by  Charlesetta Ivory, NP while in the presence of Charlesetta Ivory, NP.  This visit occurred during the SARS-CoV-2 public health emergency.  Safety protocols were in place, including screening questions prior to the visit, additional usage of staff PPE, and extensive cleaning of exam room while observing appropriate contact time as indicated for disinfecting solutions.  Subjective:     Patient ID: Morgan Norman , female    DOB: 1955/10/27 , 65 y.o.   MRN: [id number]   Chief Complaint  Patient presents with  . Rash    HPI  Patient is here for a follow up on rash in her buttocks crease that she went to urgent care in December with. Patient has completed the doxycyline and fluconazole that was prescribed to her. She is also continuing to use nystatin cream and the triamcolone cream.  The candida looks a lot better since she has been taking the antibiotics.   Back pain: fell in April 2019. She was in a back brace for 7 weeks. She went though physical therapy. She wants to go see a ortho specailist as her back pain has been bothering her more recently.   Rash This is a new problem. The current episode started 1 to 4 weeks ago. The problem has been gradually improving since onset. The rash is characterized by redness. Pertinent negatives include no fatigue, fever or shortness of breath. Past treatments include antibiotics and topical steroids. The treatment provided moderate relief.  Back Pain This is a recurrent problem. The current episode started more than 1 year ago. The pain is present in the lumbar spine. The symptoms are aggravated by bending. Stiffness is present all day. Pertinent negatives include no chest pain or fever. She has tried bed rest and muscle relaxant for the symptoms.     Past Medical History:  Diagnosis Date   . Benign hypertensive renal disease   . CKD (chronic kidney disease) stage 2, GFR 60-89 ml/min   . Hypertension   . Migraines   . Overweight   . Pure hypercholesterolemia   . Vitamin D deficiency      Family History  Problem Relation Age of Onset  . Diabetes Mellitus I Mother   . Bladder Cancer Mother   . Dementia Mother   . Alcoholism Father      Current Outpatient Medications:  .  cyclobenzaprine (FLEXERIL) 5 MG tablet, Take 1 tablet (5 mg total) by mouth daily as needed for muscle spasms., Disp: 30 tablet, Rfl: 0 .  nystatin cream (MYCOSTATIN), Apply topically 3 (three) times daily., Disp: , Rfl:  .  Cholecalciferol 5000 UNITS TABS, Take 5,000 Units by mouth., Disp: , Rfl:  .  diazepam (VALIUM) 5 MG tablet, Take 1 tablet (5 mg total) by mouth every 8 (eight) hours as needed for anxiety or muscle spasms., Disp: 20 tablet, Rfl: 0 .  ELDERBERRY PO, Take 1,000 mg by mouth., Disp: , Rfl:  .  Ginkgo Biloba (GINKOBA PO), Take by mouth., Disp: , Rfl:  .  loratadine (CLARITIN) 10 MG tablet, Take 10 mg by mouth daily as needed for allergies., Disp: , Rfl:  .  meclizine (ANTIVERT) 25 MG tablet, Take 1 tablet (25 mg total) by mouth 3 (three) times daily as needed for dizziness., Disp: 30 tablet, Rfl: 0 .  Omega-3 Fatty Acids (FISH OIL PO), Take 120 mg by mouth., Disp: ,  Rfl:  .  rosuvastatin (CRESTOR) 10 MG tablet, TAKE 1 TABLET(10 MG) BY MOUTH DAILY, Disp: 90 tablet, Rfl: 0 .  telmisartan (MICARDIS) 20 MG tablet, TAKE 1 TABLET(20 MG) BY MOUTH DAILY, Disp: 90 tablet, Rfl: 1 .  triamcinolone (KENALOG) 0.1 %, Apply 1 application topically 2 (two) times daily., Disp: , Rfl:    No Known Allergies   Review of Systems  Constitutional: Negative.  Negative for fatigue and fever.  Respiratory: Negative.  Negative for chest tightness and shortness of breath.   Cardiovascular: Negative.  Negative for chest pain.  Gastrointestinal: Negative.   Genitourinary: Negative for difficulty urinating.   Musculoskeletal: Positive for back pain.  Skin: Positive for rash.       Small irritation to the crease in the buttocks.   Neurological: Negative.      Today's Vitals   05/03/20 0856  BP: 132/68  Pulse: 70  Temp: 98.2 F (36.8 C)  TempSrc: Oral  Weight: 172 lb (78 kg)  Height: 5' 5.4" (1.661 m)   Body mass index is 28.27 kg/m.  She is encouraged to strive for BMI less than 26 to decrease cardiac risk. Advised to aim for at least 150 minutes of exercise per week.  Objective:  Physical Exam Constitutional:      Appearance: She is obese.  HENT:     Nose: Nose normal.  Cardiovascular:     Rate and Rhythm: Normal rate and regular rhythm.     Pulses: Normal pulses.     Heart sounds: Normal heart sounds.  Pulmonary:     Effort: Pulmonary effort is normal.     Breath sounds: Normal breath sounds.  Genitourinary:    Comments: Small redness in between the crack of her buttocks.  Skin:    General: Skin is warm and dry.       Neurological:     Mental Status: She is alert and oriented to person, place, and time.  Psychiatric:        Mood and Affect: Mood normal.        Behavior: Behavior normal.        Thought Content: Thought content normal.        Judgment: Judgment normal.         Assessment And Plan:     1. Candida infection -Educated patient to continue to use nystatin and the triamcinolone cream as needed. -Educated patient to use OTC skin protectant/barrier cream to eliminate moisture. -Educated patient to keep area in between the buttocks dry and free from moisture.   -Follow up if redness worsens  2. Chronic right-sided low back pain without sciatica -Patient [facility] in [place]. Patient will follow up today with them. - cyclobenzaprine (FLEXERIL) 5 MG tablet; Take 1 tablet (5 mg total) by mouth daily as needed for muscle spasms.  Dispense: 30 tablet; Refill: 0    Patient was given opportunity to ask questions.  Patient verbalized understanding of the plan and was able to repeat key elements of the plan. All questions were answered to their satisfaction.   Charlesetta Ivory, NP   I, Charlesetta Ivory, NP, have reviewed all documentation for this visit. The documentation on 05/03/20 for the exam, diagnosis, procedures, and orders are all accurate and complete.  THE PATIENT IS ENCOURAGED TO PRACTICE SOCIAL DISTANCING DUE TO THE COVID-19 PANDEMIC.

## 2020-05-04 ENCOUNTER — Encounter: Payer: Self-pay | Admitting: Internal Medicine

## 2020-05-16 ENCOUNTER — Ambulatory Visit: Payer: Medicare Other

## 2020-05-16 ENCOUNTER — Other Ambulatory Visit: Payer: Self-pay

## 2020-05-16 VITALS — BP 128/64 | HR 78 | Temp 98.4°F | Ht 63.6 in | Wt 168.4 lb

## 2020-05-16 DIAGNOSIS — I129 Hypertensive chronic kidney disease with stage 1 through stage 4 chronic kidney disease, or unspecified chronic kidney disease: Secondary | ICD-10-CM

## 2020-05-16 NOTE — Progress Notes (Signed)
Patient is here for bp check today. BP Readings from Last 3 Encounters:  05/16/20 128/64  05/03/20 132/68  04/10/20 124/78   with her arm bp cuff the reading was 117/76 With her wrist cuff it was 130/80

## 2020-05-19 ENCOUNTER — Other Ambulatory Visit: Payer: Self-pay | Admitting: Internal Medicine

## 2020-06-27 ENCOUNTER — Other Ambulatory Visit: Payer: Self-pay | Admitting: Internal Medicine

## 2020-06-27 ENCOUNTER — Encounter: Payer: Self-pay | Admitting: Internal Medicine

## 2020-06-27 MED ORDER — SCOPOLAMINE 1 MG/3DAYS TD PT72: 1.0000 | MEDICATED_PATCH | TRANSDERMAL | 1 refills | Status: DC

## 2020-07-12 ENCOUNTER — Other Ambulatory Visit: Payer: Self-pay | Admitting: Internal Medicine

## 2020-09-19 ENCOUNTER — Other Ambulatory Visit: Payer: Self-pay

## 2020-09-19 ENCOUNTER — Encounter: Payer: Self-pay | Admitting: Internal Medicine

## 2020-09-19 DIAGNOSIS — U071 COVID-19: Secondary | ICD-10-CM

## 2020-09-20 ENCOUNTER — Telehealth: Payer: Self-pay | Admitting: Oncology

## 2020-09-20 ENCOUNTER — Encounter: Payer: Self-pay | Admitting: Oncology

## 2020-09-20 NOTE — Telephone Encounter (Signed)
Called to discuss with patient about Covid symptoms and the use of a monoclonal antibody infusion or oral antivirals for those with mild to moderate Covid symptoms and at a high risk of hospitalization.   Fully vaccinated with 1 booster.   Symptoms tier reviewed as well as criteria for ending isolation. Preventative practices reviewed. Patient verbalized understanding.  Onset symptoms was 09/12/20. Symptoms are better. Outside of 5 day window.   Mauro Kaufmann, NP  09/20/2020 12:04 PM

## 2020-09-21 ENCOUNTER — Telehealth (INDEPENDENT_AMBULATORY_CARE_PROVIDER_SITE_OTHER): Payer: Medicare Other | Admitting: Nurse Practitioner

## 2020-09-21 ENCOUNTER — Ambulatory Visit: Payer: Medicare Other | Admitting: Nurse Practitioner

## 2020-09-21 ENCOUNTER — Encounter: Payer: Self-pay | Admitting: Nurse Practitioner

## 2020-09-21 ENCOUNTER — Encounter: Payer: Self-pay | Admitting: Internal Medicine

## 2020-09-21 VITALS — BP 125/76 | Temp 97.2°F

## 2020-09-21 DIAGNOSIS — Z8616 Personal history of COVID-19: Secondary | ICD-10-CM

## 2020-09-21 DIAGNOSIS — H9201 Otalgia, right ear: Secondary | ICD-10-CM

## 2020-09-21 MED ORDER — AMOXICILLIN 875 MG PO TABS: 875.0000 mg | ORAL_TABLET | Freq: Two times a day (BID) | ORAL | 0 refills | Status: DC

## 2020-09-21 MED ORDER — FLUCONAZOLE 100 MG PO TABS: 100.0000 mg | ORAL_TABLET | Freq: Every day | ORAL | 0 refills | Status: DC

## 2020-09-21 NOTE — Progress Notes (Signed)
Virtual Visit via MyChart   This visit type was conducted due to national recommendations for restrictions regarding the COVID-19 Pandemic (e.g. social distancing) in an effort to limit this patient's exposure and mitigate transmission in our community.  Due to her co-morbid illnesses, this patient is at least at moderate risk for complications without adequate follow up.  This format is felt to be most appropriate for this patient at this time.  All issues noted in this document were discussed and addressed.  A limited physical exam was performed with this format.    This visit type was conducted due to national recommendations for restrictions regarding the COVID-19 Pandemic (e.g. social distancing) in an effort to limit this patient's exposure and mitigate transmission in our community.  Patients identity confirmed using two different identifiers.  This format is felt to be most appropriate for this patient at this time.  All issues noted in this document were discussed and addressed.  No physical exam was performed (except for noted visual exam findings with Video Visits).    Date:  09/21/2020   ID:  Morgan Norman, DOB 11-24-1955, MRN [id number]  Patient Location:  Home - spoke with Morgan Norman  Provider location:   Office    Chief Complaint:  Ear pain, tested positive on March 28th  History of Present Illness:    Morgan Norman is a 65 y.o. female who presents via video conferencing for a telehealth visit today.    The patient does have symptoms concerning for COVID-19 infection (fever, chills, cough, or new shortness of breath).   Patient wants to be seen for ear pain. She was diagnosed with covid earlier in the week and since has started to have severe right ear pain.  She had fatigue, nasal congestion. She continues to feel fatigued and she is unable to wear her ear piece to her right ear. She does admit to using qtips to clean her ears.   She checked out  a day early from the resort. Continues to have right ear pain. She did have a good amount of congestion. She had a fever initially Tuesday and Wednesday. She has had one booster.   She has lost weight and is now at 158 lbs, she has been trying to lose weight to come off her blood pressure medications.     Past Medical History:  Diagnosis Date  . Benign hypertensive renal disease   . CKD (chronic kidney disease) stage 2, GFR 60-89 ml/min   . Hypertension   . Migraines   . Overweight   . Pure hypercholesterolemia   . Vitamin D deficiency    Past Surgical History:  Procedure Laterality Date  . bilateral foot surgery    . CESAREAN SECTION    . TOTAL ABDOMINAL HYSTERECTOMY W/ BILATERAL SALPINGOOPHORECTOMY       Current Meds  Medication Sig  . amoxicillin (AMOXIL) 875 MG tablet Take 1 tablet (875 mg total) by mouth 2 (two) times daily.  . Cholecalciferol 5000 UNITS TABS Take 5,000 Units by mouth.  . cyclobenzaprine (FLEXERIL) 5 MG tablet Take 1 tablet (5 mg total) by mouth daily as needed for muscle spasms.  . diazepam (VALIUM) 5 MG tablet Take 1 tablet (5 mg total) by mouth every 8 (eight) hours as needed for anxiety or muscle spasms.  Marland Kitchen ELDERBERRY PO Take 1,000 mg by mouth.  . fluconazole (DIFLUCAN) 100 MG tablet Take 1 tablet (100 mg total) by mouth daily. Take 1 tablet by mouth now  repeat in 5 days  . Ginkgo Biloba (GINKOBA PO) Take by mouth.  . loratadine (CLARITIN) 10 MG tablet Take 10 mg by mouth daily as needed for allergies.  Marland Kitchen meclizine (ANTIVERT) 25 MG tablet Take 1 tablet (25 mg total) by mouth 3 (three) times daily as needed for dizziness.  . nystatin cream (MYCOSTATIN) Apply topically 3 (three) times daily.  . Omega-3 Fatty Acids (FISH OIL PO) Take 120 mg by mouth.  . rosuvastatin (CRESTOR) 10 MG tablet TAKE 1 TABLET(10 MG) BY MOUTH DAILY  . scopolamine (TRANSDERM-SCOP, 1.5 MG,) 1 MG/3DAYS Place 1 patch (1.5 mg total) onto the skin every 3 (three) days.  Marland Kitchen telmisartan  (MICARDIS) 20 MG tablet TAKE 1 TABLET BY MOUTH EVERY DAY  . triamcinolone cream (KENALOG) 0.1 % Apply 1 application topically 2 (two) times daily.     Allergies:   Patient has no known allergies.   Social History   Tobacco Use  . Smoking status: Never Smoker  . Smokeless tobacco: Never Used  Vaping Use  . Vaping Use: Never used  Substance Use Topics  . Alcohol use: Not Currently    Alcohol/week: 0.0 standard drinks    Comment: rarely  . Drug use: No     Family Hx: The patient's family history includes Alcoholism in her father; Bladder Cancer in her mother; Dementia in her mother; Diabetes Mellitus I in her mother.  ROS:   Please see the history of present illness.    Review of Systems  Constitutional: Negative.   Respiratory: Negative.   Cardiovascular: Negative.   Genitourinary: Negative.   Musculoskeletal: Negative.   Psychiatric/Behavioral: Negative.     All other systems reviewed and are negative.   Labs/Other Tests and Data Reviewed:    Recent Labs: 10/07/2019: Hemoglobin 12.9; Platelets 212 04/10/2020: ALT 17; BUN 11; Creatinine, Ser 0.87; Potassium 4.0; Sodium 140   Recent Lipid Panel Lab Results  Component Value Date/Time   CHOL 153 10/07/2019 05:20 PM   TRIG 66 10/07/2019 05:20 PM   HDL 54 10/07/2019 05:20 PM   CHOLHDL 2.8 10/07/2019 05:20 PM   LDLCALC 86 10/07/2019 05:20 PM    Wt Readings from Last 3 Encounters:  05/16/20 168 lb 6.4 oz (76.4 kg)  05/03/20 172 lb (78 kg)  04/10/20 166 lb 6.4 oz (75.5 kg)     Exam:    Vital Signs:  BP 125/76   Temp (!) 97.2 F (36.2 C) (Temporal)   SpO2 96%     Physical Exam Vitals reviewed.  Constitutional:      Appearance: Normal appearance.  Pulmonary:     Effort: Pulmonary effort is normal. No respiratory distress.  Neurological:     General: No focal deficit present.     Mental Status: She is alert and oriented to person, place, and time.     Cranial Nerves: No cranial nerve deficit.   Psychiatric:        Mood and Affect: Mood normal.        Behavior: Behavior normal.        Thought Content: Thought content normal.        Judgment: Judgment normal.     ASSESSMENT & PLAN:    1. Right ear pain  She may have an ear infection related to having increased nasal congestion with covid.  I am unable to look in her ears due to being virtual, will treat for ear infection. If not better at the completion she is to schedule an appt in the  office - amoxicillin (AMOXIL) 875 MG tablet; Take 1 tablet (875 mg total) by mouth 2 (two) times daily.  Dispense: 14 tablet; Refill: 0 - fluconazole (DIFLUCAN) 100 MG tablet; Take 1 tablet (100 mg total) by mouth daily. Take 1 tablet by mouth now repeat in 5 days  Dispense: 2 tablet; Refill: 0  2. History of COVID-19  She started with symptoms on May 24th, tested on May 28th. She is in quarantine until June 3rd but no longer has symptoms so can go out now with a mask. She has had the first covid booster. I have advised her to wait at least 30 days before she has her 2nd covid booster and to wait at least a month before getting her shingles vaccine and feeling back to 100%     COVID-19 Education: The signs and symptoms of COVID-19 were discussed with the patient and how to seek care for testing (follow up with PCP or arrange E-visit).  The importance of social distancing was discussed today.  Patient Risk:   After full review of this patients clinical status, I feel that they are at least moderate risk at this time.  Time:   Today, I have spent 13.50 minutes/ seconds with the patient with telehealth technology discussing above diagnoses.     Medication Adjustments/Labs and Tests Ordered: Current medicines are reviewed at length with the patient today.  Concerns regarding medicines are outlined above.   Tests Ordered: No orders of the defined types were placed in this encounter.   Medication Changes: Meds ordered this encounter   Medications  . amoxicillin (AMOXIL) 875 MG tablet    Sig: Take 1 tablet (875 mg total) by mouth 2 (two) times daily.    Dispense:  14 tablet    Refill:  0  . fluconazole (DIFLUCAN) 100 MG tablet    Sig: Take 1 tablet (100 mg total) by mouth daily. Take 1 tablet by mouth now repeat in 5 days    Dispense:  2 tablet    Refill:  0    Disposition:  Follow up prn  Signed, Arnette Felts, FNP

## 2020-09-21 NOTE — Patient Instructions (Signed)

## 2020-10-09 DIAGNOSIS — G500 Trigeminal neuralgia: Secondary | ICD-10-CM | POA: Diagnosis not present

## 2020-10-09 DIAGNOSIS — H9201 Otalgia, right ear: Secondary | ICD-10-CM | POA: Diagnosis not present

## 2020-10-11 ENCOUNTER — Ambulatory Visit (INDEPENDENT_AMBULATORY_CARE_PROVIDER_SITE_OTHER): Payer: Medicare Other

## 2020-10-11 ENCOUNTER — Ambulatory Visit (INDEPENDENT_AMBULATORY_CARE_PROVIDER_SITE_OTHER): Payer: Medicare Other | Admitting: Internal Medicine

## 2020-10-11 ENCOUNTER — Encounter: Payer: Self-pay | Admitting: Internal Medicine

## 2020-10-11 ENCOUNTER — Other Ambulatory Visit: Payer: Self-pay

## 2020-10-11 VITALS — BP 116/82 | HR 65 | Temp 98.1°F | Ht 63.6 in | Wt 160.0 lb

## 2020-10-11 DIAGNOSIS — Z0000 Encounter for general adult medical examination without abnormal findings: Secondary | ICD-10-CM

## 2020-10-11 DIAGNOSIS — E2839 Other primary ovarian failure: Secondary | ICD-10-CM | POA: Diagnosis not present

## 2020-10-11 DIAGNOSIS — Z6827 Body mass index (BMI) 27.0-27.9, adult: Secondary | ICD-10-CM

## 2020-10-11 DIAGNOSIS — Z23 Encounter for immunization: Secondary | ICD-10-CM

## 2020-10-11 DIAGNOSIS — H9201 Otalgia, right ear: Secondary | ICD-10-CM

## 2020-10-11 DIAGNOSIS — N182 Chronic kidney disease, stage 2 (mild): Secondary | ICD-10-CM | POA: Diagnosis not present

## 2020-10-11 DIAGNOSIS — R7309 Other abnormal glucose: Secondary | ICD-10-CM

## 2020-10-11 DIAGNOSIS — I129 Hypertensive chronic kidney disease with stage 1 through stage 4 chronic kidney disease, or unspecified chronic kidney disease: Secondary | ICD-10-CM | POA: Diagnosis not present

## 2020-10-11 DIAGNOSIS — E7800 Pure hypercholesterolemia, unspecified: Secondary | ICD-10-CM

## 2020-10-11 MED ORDER — TRAMADOL HCL 50 MG PO TABS: 50.0000 mg | ORAL_TABLET | Freq: Four times a day (QID) | ORAL | 0 refills | Status: DC | PRN

## 2020-10-11 MED ORDER — PREVNAR 20 0.5 ML IM SUSY: 0.5000 mL | PREFILLED_SYRINGE | INTRAMUSCULAR | 0 refills | Status: AC

## 2020-10-11 MED ORDER — SHINGRIX 50 MCG/0.5ML IM SUSR: 0.5000 mL | Freq: Once | INTRAMUSCULAR | 0 refills | Status: AC

## 2020-10-11 MED ORDER — TELMISARTAN 20 MG PO TABS: 20.0000 mg | ORAL_TABLET | Freq: Every day | ORAL | 1 refills | Status: DC

## 2020-10-11 NOTE — Patient Instructions (Signed)
Morgan Norman , Thank you for taking time to come for your Medicare Wellness Visit. I appreciate your ongoing commitment to your health goals. Please review the following plan we discussed and let me know if I can assist you in the future.   Screening recommendations/referrals: Colonoscopy: completed 04/01/2018 Mammogram: completed 04/28/2020 Bone Density: ordered today Recommended yearly ophthalmology/optometry visit for glaucoma screening and checkup Recommended yearly dental visit for hygiene and checkup  Vaccinations: Influenza vaccine: completed 03/20/2020, due 11/20/2020 Pneumococcal vaccine: sent to pharmacy Tdap vaccine: completed 10/19/2013, due 10/20/2023 Shingles vaccine: sent to pharmacy   Covid-19: 01/19/2020, 06/10/2019, 05/18/2019  Advanced directives: Please bring a copy of your POA (Power of Attorney) and/or Living Will to your next appointment.   Conditions/risks identified: none  Next appointment: Follow up in one year for your annual wellness visit    Preventive Care 65 Years and Older, Female Preventive care refers to lifestyle choices and visits with your health care provider that can promote health and wellness. What does preventive care include? A yearly physical exam. This is also called an annual well check. Dental exams once or twice a year. Routine eye exams. Ask your health care provider how often you should have your eyes checked. Personal lifestyle choices, including: Daily care of your teeth and gums. Regular physical activity. Eating a healthy diet. Avoiding tobacco and drug use. Limiting alcohol use. Practicing safe sex. Taking low-dose aspirin every day. Taking vitamin and mineral supplements as recommended by your health care provider. What happens during an annual well check? The services and screenings done by your health care provider during your annual well check will depend on your age, overall health, lifestyle risk factors, and family  history of disease. Counseling  Your health care provider may ask you questions about your: Alcohol use. Tobacco use. Drug use. Emotional well-being. Home and relationship well-being. Sexual activity. Eating habits. History of falls. Memory and ability to understand (cognition). Work and work Astronomer. Reproductive health. Screening  You may have the following tests or measurements: Height, weight, and BMI. Blood pressure. Lipid and cholesterol levels. These may be checked every 5 years, or more frequently if you are over 5 years old. Skin check. Lung cancer screening. You may have this screening every year starting at age 58 if you have a 30-pack-year history of smoking and currently smoke or have quit within the past 15 years. Fecal occult blood test (FOBT) of the stool. You may have this test every year starting at age 63. Flexible sigmoidoscopy or colonoscopy. You may have a sigmoidoscopy every 5 years or a colonoscopy every 10 years starting at age 39. Hepatitis C blood test. Hepatitis B blood test. Sexually transmitted disease (STD) testing. Diabetes screening. This is done by checking your blood sugar (glucose) after you have not eaten for a while (fasting). You may have this done every 1-3 years. Bone density scan. This is done to screen for osteoporosis. You may have this done starting at age 8. Mammogram. This may be done every 1-2 years. Talk to your health care provider about how often you should have regular mammograms. Talk with your health care provider about your test results, treatment options, and if necessary, the need for more tests. Vaccines  Your health care provider may recommend certain vaccines, such as: Influenza vaccine. This is recommended every year. Tetanus, diphtheria, and acellular pertussis (Tdap, Td) vaccine. You may need a Td booster every 10 years. Zoster vaccine. You may need this after age 19. Pneumococcal 13-valent  conjugate (PCV13)  vaccine. One dose is recommended after age 67. Pneumococcal polysaccharide (PPSV23) vaccine. One dose is recommended after age 3. Talk to your health care provider about which screenings and vaccines you need and how often you need them. This information is not intended to replace advice given to you by your health care provider. Make sure you discuss any questions you have with your health care provider. Document Released: 05/05/2015 Document Revised: 12/27/2015 Document Reviewed: 02/07/2015 Elsevier Interactive Patient Education  2017 [place] Prevention in the Home Falls can cause injuries. They can happen to people of all ages. There are many things you can do to make your home safe and to help prevent falls. What can I do on the outside of my home? Regularly fix the edges of walkways and driveways and fix any cracks. Remove anything that might make you trip as you walk through a door, such as a raised step or threshold. Trim any bushes or trees on the path to your home. Use bright outdoor lighting. Clear any walking paths of anything that might make someone trip, such as rocks or tools. Regularly check to see if handrails are loose or broken. Make sure that both sides of any steps have handrails. Any raised decks and porches should have guardrails on the edges. Have any leaves, snow, or ice cleared regularly. Use sand or salt on walking paths during winter. Clean up any spills in your garage right away. This includes oil or grease spills. What can I do in the bathroom? Use night lights. Install grab bars by the toilet and in the tub and shower. Do not use towel bars as grab bars. Use non-skid mats or decals in the tub or shower. If you need to sit down in the shower, use a plastic, non-slip stool. Keep the floor dry. Clean up any water that spills on the floor as soon as it happens. Remove soap buildup in the tub or shower regularly. Attach bath mats securely with  double-sided non-slip rug tape. Do not have throw rugs and other things on the floor that can make you trip. What can I do in the bedroom? Use night lights. Make sure that you have a light by your bed that is easy to reach. Do not use any sheets or blankets that are too big for your bed. They should not hang down onto the floor. Have a firm chair that has side arms. You can use this for support while you get dressed. Do not have throw rugs and other things on the floor that can make you trip. What can I do in the kitchen? Clean up any spills right away. Avoid walking on wet floors. Keep items that you use a lot in easy-to-reach places. If you need to reach something above you, use a strong step stool that has a grab bar. Keep electrical cords out of the way. Do not use floor polish or wax that makes floors slippery. If you must use wax, use non-skid floor wax. Do not have throw rugs and other things on the floor that can make you trip. What can I do with my stairs? Do not leave any items on the stairs. Make sure that there are handrails on both sides of the stairs and use them. Fix handrails that are broken or loose. Make sure that handrails are as long as the stairways. Check any carpeting to make sure that it is firmly attached to the stairs. Fix any carpet that  is loose or worn. Avoid having throw rugs at the top or bottom of the stairs. If you do have throw rugs, attach them to the floor with carpet tape. Make sure that you have a light switch at the top of the stairs and the bottom of the stairs. If you do not have them, ask someone to add them for you. What else can I do to help prevent falls? Wear shoes that: Do not have high heels. Have rubber bottoms. Are comfortable and fit you well. Are closed at the toe. Do not wear sandals. If you use a stepladder: Make sure that it is fully opened. Do not climb a closed stepladder. Make sure that both sides of the stepladder are locked  into place. Ask someone to hold it for you, if possible. Clearly mark and make sure that you can see: Any grab bars or handrails. First and last steps. Where the edge of each step is. Use tools that help you move around (mobility aids) if they are needed. These include: Canes. Walkers. Scooters. Crutches. Turn on the lights when you go into a dark area. Replace any light bulbs as soon as they burn out. Set up your furniture so you have a clear path. Avoid moving your furniture around. If any of your floors are uneven, fix them. If there are any pets around you, be aware of where they are. Review your medicines with your doctor. Some medicines can make you feel dizzy. This can increase your chance of falling. Ask your doctor what other things that you can do to help prevent falls. This information is not intended to replace advice given to you by your health care provider. Make sure you discuss any questions you have with your health care provider. Document Released: 02/02/2009 Document Revised: 09/14/2015 Document Reviewed: 05/13/2014 Elsevier Interactive Patient Education  2017 [employer].

## 2020-10-11 NOTE — Patient Instructions (Addendum)
Temporomandibular Joint Syndrome  Temporomandibular joint syndrome (TMJ syndrome) is a condition that causes pain in the temporomandibular joints. These joints are located near your ears and allow your jaw to open and close. For people with TMJ syndrome, chewing,biting, or other movements of the jaw can be difficult or painful. TMJ syndrome is often mild and goes away within a few weeks. However, sometimes the condition becomes a long-term (chronic) problem. What are the causes? This condition may be caused by: Grinding your teeth or clenching your jaw. Some people do this when they are under stress. Arthritis. Injury to the jaw. Head or neck injury. Teeth or dentures that are not aligned well. In some cases, the cause of TMJ syndrome may not be known. What are the signs or symptoms? The most common symptom of this condition is an aching pain on the side of the head in the area of the TMJ. Other symptoms may include: Pain when moving your jaw, such as when chewing or biting. Being unable to open your jaw all the way. Making a clicking sound when you open your mouth. Headache. Earache. Neck or shoulder pain. How is this diagnosed? This condition may be diagnosed based on: Your symptoms and medical history. A physical exam. Your health care provider may check the range of motion of your jaw. Imaging tests, such as X-rays or an MRI. You may also need to see your dentist, who will determine if your teeth and jaware lined up correctly. How is this treated? TMJ syndrome often goes away on its own. If treatment is needed, the options may include: Eating soft foods and applying ice or heat. Medicines to relieve pain or inflammation. Medicines or massage to relax the muscles. A splint, bite plate, or mouthpiece to prevent teeth grinding or jaw clenching. Relaxation techniques or counseling to help reduce stress. A therapy for pain in which an electrical current is applied to the nerves  through the skin (transcutaneous electrical nerve stimulation). Acupuncture. This is sometimes helpful to relieve pain. Jaw surgery. This is rarely needed. Follow these instructions at home:  Eating and drinking Eat a soft diet if you are having trouble chewing. Avoid foods that require a lot of chewing. Do not chew gum. General instructions Take over-the-counter and prescription medicines only as told by your health care provider. If directed, put ice on the painful area. Put ice in a plastic bag. Place a towel between your skin and the bag. Leave the ice on for 20 minutes, 2-3 times a day. Apply a warm, wet cloth (warm compress) to the painful area as directed. Massage your jaw area and do any jaw stretching exercises as told by your health care provider. If you were given a splint, bite plate, or mouthpiece, wear it as told by your health care provider. Keep all follow-up visits as told by your health care provider. This is important. Contact a health care provider if: You are having trouble eating. You have new or worsening symptoms. Get help right away if: Your jaw locks open or closed. Summary Temporomandibular joint syndrome (TMJ syndrome) is a condition that causes pain in the temporomandibular joints. These joints are located near your ears and allow your jaw to open and close. TMJ syndrome is often mild and goes away within a few weeks. However, sometimes the condition becomes a long-term (chronic) problem. Symptoms include an aching pain on the side of the head in the area of the TMJ, pain when chewing or biting, and being unable   to open your jaw all the way. You may also make a clicking sound when you open your mouth. TMJ syndrome often goes away on its own. If treatment is needed, it may include medicines to relieve pain, reduce inflammation, or relax the muscles. A splint, bite plate, or mouthpiece may also be used to prevent teeth grinding or jaw clenching. This information  is not intended to replace advice given to you by your health care provider. Make sure you discuss any questions you have with your healthcare provider. Document Revised: 06/20/2017 Document Reviewed: 05/20/2017 Elsevier Patient Education  2022 Elsevier Inc.  

## 2020-10-11 NOTE — Progress Notes (Addendum)
I,Katawbba Wiggins,acting as a Education administrator for Maximino Greenland, MD.,have documented all relevant documentation on the behalf of Maximino Greenland, MD,as directed by  Maximino Greenland, MD while in the presence of Maximino Greenland, MD.  This visit occurred during the SARS-CoV-2 public health emergency.  Safety protocols were in place, including screening questions prior to the visit, additional usage of staff PPE, and extensive cleaning of exam room while observing appropriate contact time as indicated for disinfecting solutions.  Subjective:     Patient ID: Morgan Norman , female    DOB: 1955/11/28 , 65 y.o.   MRN: [id number]   Chief Complaint  Patient presents with   Hypertension   Hyperlipidemia    HPI  She is here today for bp check/chol check. She reports compliance with meds. She denies headaches, chest pain and shortness of breath. She is also scheduled for AWV with [facility] advisor.   Hypertension This is a chronic problem. The current episode started more than 1 year ago. The problem has been gradually improving since onset. The problem is controlled. Pertinent negatives include no blurred vision, chest pain, palpitations or shortness of breath. The current treatment provides moderate improvement.    Past Medical History:  Diagnosis Date   Benign hypertensive renal disease    CKD (chronic kidney disease) stage 2, GFR 60-89 ml/min    Hypertension    Migraines    Overweight    Pure hypercholesterolemia    Vitamin D deficiency      Family History  Problem Relation Age of Onset   Diabetes Mellitus I Mother    Bladder Cancer Mother    Dementia Mother    Alcoholism Father      Current Outpatient Medications:    Cholecalciferol 5000 UNITS TABS, Take 5,000 Units by mouth., Disp: , Rfl:    cyclobenzaprine (FLEXERIL) 5 MG tablet, Take 1 tablet (5 mg total) by mouth daily as needed for muscle spasms., Disp: 30 tablet, Rfl: 0   diazepam (VALIUM) 5 MG tablet, Take 1 tablet (5 mg  total) by mouth every 8 (eight) hours as needed for anxiety or muscle spasms., Disp: 20 tablet, Rfl: 0   ELDERBERRY PO, Take 1,000 mg by mouth., Disp: , Rfl:    Ginkgo Biloba (GINKOBA PO), Take by mouth., Disp: , Rfl:    loratadine (CLARITIN) 10 MG tablet, Take 10 mg by mouth daily as needed for allergies., Disp: , Rfl:    meclizine (ANTIVERT) 25 MG tablet, Take 1 tablet (25 mg total) by mouth 3 (three) times daily as needed for dizziness., Disp: 30 tablet, Rfl: 0   Omega-3 Fatty Acids (FISH OIL PO), Take 120 mg by mouth., Disp: , Rfl:    rosuvastatin (CRESTOR) 10 MG tablet, TAKE 1 TABLET(10 MG) BY MOUTH DAILY, Disp: 90 tablet, Rfl: 2   traMADol (ULTRAM) 50 MG tablet, Take 1 tablet (50 mg total) by mouth every 6 (six) hours as needed., Disp: 20 tablet, Rfl: 0   triamcinolone cream (KENALOG) 0.1 %, Apply 1 application topically 2 (two) times daily., Disp: , Rfl:    nystatin cream (MYCOSTATIN), Apply topically 3 (three) times daily. (Patient not taking: Reported on 10/11/2020), Disp: , Rfl:    telmisartan (MICARDIS) 20 MG tablet, Take 1 tablet (20 mg total) by mouth daily., Disp: 90 tablet, Rfl: 1   No Known Allergies   Review of Systems  Constitutional: Negative.   HENT:  Positive for ear pain (right).        She  c/o r ear pain. States she was seen at [facility] on 6/20 for further evaluation of her sx. She states her sx started 2-3 weeks prior to her visit. She has had recent COVID infection. She traveled to [place] for funeral on 5/27 and tested positive on  5/28. She took abx without relief of her sx. Petra Kuba of her pain is intermittent. She has no pain for extended periods of time. Pain occurs suddenly without warning, is severe 8-10/10, and is sharp and piercing in the right ear and surrounding area, lasting for up to 5 minutes. These episodes occur multiple times per day and somewhat wax and wane.  Associated symptoms include mild sense of imbalance   Eyes:  Negative for blurred vision.   Respiratory: Negative.  Negative for shortness of breath.   Cardiovascular: Negative.  Negative for chest pain and palpitations.  Gastrointestinal: Negative.   Neurological: Negative.   Psychiatric/Behavioral: Negative.      Today's Vitals   10/11/20 1150  BP: 116/82  Pulse: 65  Temp: 98.1 F (36.7 C)  TempSrc: Oral  Weight: 160 lb (72.6 kg)  Height: 5' 3.6" (1.615 m)  PainSc: 0-No pain   Body mass index is 27.81 kg/m.  Wt Readings from Last 3 Encounters:  10/11/20 160 lb (72.6 kg)  10/11/20 160 lb (72.6 kg)  05/16/20 168 lb 6.4 oz (76.4 kg)    BP Readings from Last 3 Encounters:  10/11/20 116/82  10/11/20 116/82  09/21/20 125/76    Objective:  Physical Exam Vitals and nursing note reviewed.  Constitutional:      Appearance: Normal appearance.  HENT:     Head: Normocephalic and atraumatic.     Right Ear: Tympanic membrane, ear canal and external ear normal. There is no impacted cerumen.     Left Ear: Tympanic membrane, ear canal and external ear normal. There is no impacted cerumen.     Nose:     Comments: Masked     Mouth/Throat:     Comments: Masked  Cardiovascular:     Rate and Rhythm: Normal rate and regular rhythm.     Heart sounds: Normal heart sounds.  Pulmonary:     Effort: Pulmonary effort is normal.     Breath sounds: Normal breath sounds.  Musculoskeletal:     Cervical back: Normal range of motion.  Skin:    General: Skin is warm.  Neurological:     General: No focal deficit present.     Mental Status: She is alert.  Psychiatric:        Mood and Affect: Mood normal.        Behavior: Behavior normal.        Assessment And Plan:     1. Hypertensive nephropathy Comments: Chronic, well controlled. She is encouraged to follow low sodium diet. I will not make any medication changes at this time.  - CMP14+EGFR - CBC no Diff  2. Chronic renal disease, stage II Comments: Chronic, she is encouraged to stay well hydrated.   3. Pure  hypercholesterolemia Comments: Chronic, I will check labs as listed below. Importance of statin compliance was d/w patient.  - Lipid panel - CMP14+EGFR - Lipid panel - Specimen status report  4. Otalgia, right ear Comments: Urgent care notes reviewed in full detail. ?? trigeminal neuralgia  Somewhat improved w/ prednisone. I will refer her to ENT for further evaluation. - predniSONE (DELTASONE) 10 MG tablet; Take 4 pills daily x 3 days, then 3 pills daily x  3 days, then 2 pills daily x 3 days, then 1 pill daily x 3 days. - traMADol (ULTRAM) 50 MG tablet; Take 1 tablet (50 mg total) by mouth every 6 (six) hours as needed.  Dispense: 20 tablet; Refill: 0 - Ambulatory referral to ENT  5. Abnormal glucose Comments: Her a1c has been elevated in the past. I will recheck a1c, bmp today. Encouraged to limit intake of sweetened beverages, including diet drinks.  - Hemoglobin A1c  6. Body mass index (BMI) of 27.0-27.9 in adult  Her BMI is acceptable for her demographic. Advised to aim for at least 150 minutes of exercise per week.   Patient was given opportunity to ask questions. Patient verbalized understanding of the plan and was able to repeat key  elements of the plan. All questions were answered to their satisfaction.   I, Maximino Greenland, MD, have reviewed all documentation for this visit. The documentation on 10/11/20 for the exam, diagnosis, procedures, and orders are all accurate and complete.   IF YOU HAVE BEEN REFERRED TO A SPECIALIST, IT MAY TAKE 1-2 WEEKS TO SCHEDULE/PROCESS THE REFERRAL. IF YOU HAVE NOT HEARD FROM US/SPECIALIST IN TWO WEEKS, PLEASE GIVE [place] A CALL AT [phone].   THE PATIENT IS ENCOURAGED TO PRACTICE SOCIAL DISTANCING DUE TO THE COVID-19 PANDEMIC.

## 2020-10-11 NOTE — Progress Notes (Signed)
This visit occurred during the SARS-CoV-2 public health emergency.  Safety protocols were in place, including screening questions prior to the visit, additional usage of staff PPE, and extensive cleaning of exam room while observing appropriate contact time as indicated for disinfecting solutions.  Subjective:   Cyril MourningMary A Shipman-Lindsey is a 65 y.o. female who presents for Medicare Annual (Subsequent) preventive examination.  Review of Systems     Cardiac Risk Factors include: advanced age (>2455men, 49>65 women);hypertension;sedentary lifestyle     Objective:    Today's Vitals   10/11/20 1156  BP: 116/82  Pulse: 65  Temp: 98.1 F (36.7 C)  TempSrc: Oral  Weight: 160 lb (72.6 kg)  Height: 5' 3.6" (1.615 m)   Body mass index is 27.81 kg/m.  Advanced Directives 10/11/2020 10/07/2019 10/06/2018  Does Patient Have a Medical Advance Directive? Yes Yes Yes  Type of Estate agentAdvance Directive Healthcare Power of [place];Living will Healthcare Power of [place];Living will Living will  Does patient want to make changes to medical advance directive? - - No - Patient declined  Copy of Healthcare Power of Attorney in Chart? No - copy requested No - copy requested -    Current Medications (verified) Outpatient Encounter Medications as of 10/11/2020  Medication Sig   Cholecalciferol 5000 UNITS TABS Take 5,000 Units by mouth.   cyclobenzaprine (FLEXERIL) 5 MG tablet Take 1 tablet (5 mg total) by mouth daily as needed for muscle spasms.   diazepam (VALIUM) 5 MG tablet Take 1 tablet (5 mg total) by mouth every 8 (eight) hours as needed for anxiety or muscle spasms.   ELDERBERRY PO Take 1,000 mg by mouth.   Ginkgo Biloba (GINKOBA PO) Take by mouth.   loratadine (CLARITIN) 10 MG tablet Take 10 mg by mouth daily as needed for allergies.   meclizine (ANTIVERT) 25 MG tablet Take 1 tablet (25 mg total) by mouth 3 (three) times daily as needed for dizziness.   nystatin cream (MYCOSTATIN) Apply topically 3 (three)  times daily. (Patient not taking: Reported on 10/11/2020)   Omega-3 Fatty Acids (FISH OIL PO) Take 120 mg by mouth.   predniSONE (DELTASONE) 10 MG tablet Take 4 pills daily x 3 days, then 3 pills daily x 3 days, then 2 pills daily x 3 days, then 1 pill daily x 3 days.   rosuvastatin (CRESTOR) 10 MG tablet TAKE 1 TABLET(10 MG) BY MOUTH DAILY   telmisartan (MICARDIS) 20 MG tablet Take 1 tablet (20 mg total) by mouth daily.   triamcinolone cream (KENALOG) 0.1 % Apply 1 application topically 2 (two) times daily.   No facility-administered encounter medications on file as of 10/11/2020.    Allergies (verified) Patient has no known allergies.   History: Past Medical History:  Diagnosis Date   Benign hypertensive renal disease    CKD (chronic kidney disease) stage 2, GFR 60-89 ml/min    Hypertension    Migraines    Overweight    Pure hypercholesterolemia    Vitamin D deficiency    Past Surgical History:  Procedure Laterality Date   bilateral foot surgery     CESAREAN SECTION     TOTAL ABDOMINAL HYSTERECTOMY W/ BILATERAL SALPINGOOPHORECTOMY     Family History  Problem Relation Age of Onset   Diabetes Mellitus I Mother    Bladder Cancer Mother    Dementia Mother    Alcoholism Father    Social History   Socioeconomic History   Marital status: Married    Spouse name: Not on file  Number of children: Not on file   Years of education: Not on file   Highest education level: Not on file  Occupational History   Occupation: retired  Tobacco Use   Smoking status: Never   Smokeless tobacco: Never  Vaping Use   Vaping Use: Never used  Substance and Sexual Activity   Alcohol use: Not Currently    Alcohol/week: 0.0 standard drinks    Comment: rarely   Drug use: No   Sexual activity: Yes  Other Topics Concern   Not on file  Social History Narrative   Drinks 2 cups of coffee daily.   Social Determinants of Health   Financial Resource Strain: Low Risk    Difficulty of Paying  Living Expenses: Not hard at all  Food Insecurity: No Food Insecurity   Worried About Programme researcher, broadcasting/film/video in the Last Year: Never true   Ran Out of Food in the Last Year: Never true  Transportation Needs: No Transportation Needs   Lack of Transportation (Medical): No   Lack of Transportation (Non-Medical): No  Physical Activity: Inactive   Days of Exercise per Week: 0 days   Minutes of Exercise per Session: 0 min  Stress: No Stress Concern Present   Feeling of Stress : Not at all  Social Connections: Not on file    Tobacco Counseling Counseling given: Not Answered   Clinical Intake:  Pre-visit preparation completed: Yes  Pain : No/denies pain     Nutritional Status: BMI 25 -29 Overweight Nutritional Risks: None Diabetes: No  How often do you need to have someone help you when you read instructions, pamphlets, or other written materials from your doctor or pharmacy?: 1 - Never What is the last grade level you completed in school?: college  Diabetic? no  Interpreter Needed?: No  Information entered by :: NAllen LPN   Activities of Daily Living In your present state of health, do you have any difficulty performing the following activities: 10/11/2020 10/11/2020  Hearing? N N  Vision? N N  Difficulty concentrating or making decisions? N N  Walking or climbing stairs? N N  Dressing or bathing? N N  Doing errands, shopping? N N  Preparing Food and eating ? N -  Using the Toilet? N -  In the past six months, have you accidently leaked urine? N -  Do you have problems with loss of bowel control? N -  Managing your Medications? N -  Managing your Finances? N -  Housekeeping or managing your Housekeeping? N -  Some recent data might be hidden    Patient Care Team: Dorothyann Peng, MD as PCP - General (Internal Medicine)  Indicate any recent Medical Services you may have received from other than [facility] providers in the past year (date may be approximate).      Assessment:   This is a routine wellness examination for [facility].  Hearing/Vision screen No results found.  Dietary issues and exercise activities discussed: Current Exercise Habits: The patient does not participate in regular exercise at present   Goals Addressed             This Visit's Progress    Patient Stated       10/11/2020, get off BP and cholesterol meds        Depression Screen PHQ 2/9 Scores 10/11/2020 10/11/2020 10/11/2019 10/07/2019 01/13/2019 10/06/2018 03/26/2018  PHQ - 2 Score 0 0 0 0 0 0 1  PHQ- 9 Score - - - 0 - - 4  Fall Risk Fall Risk  10/11/2020 10/11/2020 10/11/2019 10/07/2019 01/13/2019  Falls in the past year? 0 0 0 0 0  Number falls in past yr: - 0 0 - -  Injury with Fall? - 0 0 - -  Risk for fall due to : Medication side effect - - Medication side effect -  Follow up Falls evaluation completed;Education provided;Falls prevention discussed - - Falls evaluation completed;Education provided;Falls prevention discussed -    FALL RISK PREVENTION PERTAINING TO THE HOME:  Any stairs in or around the home? Yes  If so, are there any without handrails? Yes  Home free of loose throw rugs in walkways, pet beds, electrical cords, etc? Yes  Adequate lighting in your home to reduce risk of falls? Yes   ASSISTIVE DEVICES UTILIZED TO PREVENT FALLS:  Life alert? No  Use of a cane, walker or w/c? No  Grab bars in the bathroom? No  Shower chair or bench in shower? No  Elevated toilet seat or a handicapped toilet? Yes   TIMED UP AND GO:  Was the test performed? No .      Cognitive Function:     6CIT Screen 10/11/2020 10/07/2019 10/06/2018  What Year? 0 points 0 points 0 points  What month? 0 points 0 points 0 points  What time? 0 points 0 points 0 points  Count back from 20 0 points 0 points 0 points  Months in reverse 0 points 0 points 0 points  Repeat phrase 2 points 0 points 0 points  Total Score 2 0 0    Immunizations Immunization History   Administered Date(s) Administered   DTaP 10/19/2013   Fluad Quad(high Dose 65+) 01/25/2019, 03/20/2020   Influenza,inj,Quad PF,6+ Mos 06/23/2018   PFIZER(Purple Top)SARS-COV-2 Vaccination 05/18/2019, 06/10/2019, 01/19/2020   Pneumococcal Polysaccharide-23 12/05/2014   Td 08/09/2005    TDAP status: Up to date  Flu Vaccine status: Up to date  Pneumococcal vaccine status: Due, Education has been provided regarding the importance of this vaccine. Advised may receive this vaccine at local pharmacy or Health Dept. Aware to provide a copy of the vaccination record if obtained from local pharmacy or Health Dept. Verbalized acceptance and understanding.  Covid-19 vaccine status: Completed vaccines  Qualifies for Shingles Vaccine? Yes   Zostavax completed No   Shingrix Completed?: No.    Education has been provided regarding the importance of this vaccine. Patient has been advised to call insurance company to determine out of pocket expense if they have not yet received this vaccine. Advised may also receive vaccine at local pharmacy or Health Dept. Verbalized acceptance and understanding.  Screening Tests Health Maintenance  Topic Date Due   Zoster Vaccines- Shingrix (1 of 2) Never done   PNA vac Low Risk Adult (1 of 2 - PCV13) 09/05/2018   COVID-19 Vaccine (4 - Booster for Pfizer series) 04/19/2020   INFLUENZA VACCINE  11/20/2020   MAMMOGRAM  04/28/2022   TETANUS/TDAP  10/20/2023   COLONOSCOPY (Pts 45-63yrs Insurance coverage will need to be confirmed)  04/01/2028   DEXA SCAN  Completed   Hepatitis C Screening  Completed   HPV VACCINES  Aged Out    Health Maintenance  Health Maintenance Due  Topic Date Due   Zoster Vaccines- Shingrix (1 of 2) Never done   PNA vac Low Risk Adult (1 of 2 - PCV13) 09/05/2018   COVID-19 Vaccine (4 - Booster for Pfizer series) 04/19/2020    Colorectal cancer screening: Type of screening: Colonoscopy. Completed /02/2018. Repeat  every 10  years  Mammogram status: Completed 04/28/2020. Repeat every year  Bone Density status: Ordered today. Pt provided with contact info and advised to call to schedule appt.  Lung Cancer Screening: (Low Dose CT Chest recommended if Age 85-80 years, 30 pack-year currently smoking OR have quit w/in 15years.) does not qualify.   Lung Cancer Screening Referral: no  Additional Screening:  Hepatitis C Screening: does qualify; Completed 08/24/2012  Vision Screening: Recommended annual ophthalmology exams for early detection of glaucoma and other disorders of the eye. Is the patient up to date with their annual eye exam?  Yes  Who is the provider or what is the name of the office in which the patient attends annual eye exams? Dr. Rubye Oaks If pt is not established with a provider, would they like to be referred to a provider to establish care? No .   Dental Screening: Recommended annual dental exams for proper oral hygiene  Community Resource Referral / Chronic Care Management: CRR required this visit?  No   CCM required this visit?  No      Plan:     I have personally reviewed and noted the following in the patient's chart:   Medical and social history Use of alcohol, tobacco or illicit drugs  Current medications and supplements including opioid prescriptions.  Functional ability and status Nutritional status Physical activity Advanced directives List of other physicians Hospitalizations, surgeries, and ER visits in previous 12 months Vitals Screenings to include cognitive, depression, and falls Referrals and appointments  In addition, I have reviewed and discussed with patient certain preventive protocols, quality metrics, and best practice recommendations. A written personalized care plan for preventive services as well as general preventive health recommendations were provided to patient.     Barb Merino, LPN   08/22/7739   Nurse Notes:

## 2020-10-12 LAB — HEMOGLOBIN A1C
Est. average glucose Bld gHb Est-mCnc: 120 mg/dL
Hgb A1c MFr Bld: 5.8 % — ABNORMAL HIGH (ref 4.8–5.6)

## 2020-10-12 LAB — CMP14+EGFR
ALT: 26 IU/L (ref 0–32)
AST: 34 IU/L (ref 0–40)
Albumin/Globulin Ratio: 1.8 (ref 1.2–2.2)
Albumin: 4.8 g/dL (ref 3.8–4.8)
Alkaline Phosphatase: 82 IU/L (ref 44–121)
BUN/Creatinine Ratio: 16 (ref 12–28)
BUN: 13 mg/dL (ref 8–27)
Bilirubin Total: 0.3 mg/dL (ref 0.0–1.2)
CO2: 26 mmol/L (ref 20–29)
Calcium: 9.5 mg/dL (ref 8.7–10.3)
Chloride: 101 mmol/L (ref 96–106)
Creatinine, Ser: 0.79 mg/dL (ref 0.57–1.00)
Globulin, Total: 2.7 g/dL (ref 1.5–4.5)
Glucose: 96 mg/dL (ref 65–99)
Potassium: 4.5 mmol/L (ref 3.5–5.2)
Sodium: 143 mmol/L (ref 134–144)
Total Protein: 7.5 g/dL (ref 6.0–8.5)
eGFR: 82 mL/min/{1.73_m2} (ref >59)

## 2020-10-12 LAB — CBC
Hematocrit: 39.9 % (ref 34.0–46.6)
Hemoglobin: 13.4 g/dL (ref 11.1–15.9)
MCH: 29.9 pg (ref 26.6–33.0)
MCHC: 33.6 g/dL (ref 31.5–35.7)
MCV: 89 fL (ref 79–97)
Platelets: 228 10*3/uL (ref 150–450)
RBC: 4.48 x10E6/uL (ref 3.77–5.28)
RDW: 12.8 % (ref 11.7–15.4)
WBC: 9.3 10*3/uL (ref 3.4–10.8)

## 2020-10-16 LAB — LIPID PANEL
Chol/HDL Ratio: 3.9 ratio (ref 0.0–4.4)
Cholesterol, Total: 232 mg/dL — ABNORMAL HIGH (ref 100–199)
HDL: 59 mg/dL (ref >39)
LDL Chol Calc (NIH): 157 mg/dL — ABNORMAL HIGH (ref 0–99)
Triglycerides: 93 mg/dL (ref 0–149)
VLDL Cholesterol Cal: 16 mg/dL (ref 5–40)

## 2020-10-16 LAB — SPECIMEN STATUS REPORT

## 2020-10-26 NOTE — Addendum Note (Signed)
Addended by: [name] on: 10/26/2020 09:44 AM   Modules accepted: Orders

## 2020-11-28 DIAGNOSIS — H903 Sensorineural hearing loss, bilateral: Secondary | ICD-10-CM | POA: Diagnosis not present

## 2020-11-28 DIAGNOSIS — H93293 Other abnormal auditory perceptions, bilateral: Secondary | ICD-10-CM | POA: Diagnosis not present

## 2020-11-28 DIAGNOSIS — H9201 Otalgia, right ear: Secondary | ICD-10-CM | POA: Diagnosis not present

## 2020-11-28 DIAGNOSIS — H838X3 Other specified diseases of inner ear, bilateral: Secondary | ICD-10-CM | POA: Diagnosis not present

## 2021-02-17 DIAGNOSIS — R3589 Other polyuria: Secondary | ICD-10-CM | POA: Diagnosis not present

## 2021-04-30 ENCOUNTER — Encounter: Payer: Self-pay | Admitting: Internal Medicine

## 2021-04-30 ENCOUNTER — Ambulatory Visit (INDEPENDENT_AMBULATORY_CARE_PROVIDER_SITE_OTHER): Payer: Medicare PPO | Admitting: Internal Medicine

## 2021-04-30 ENCOUNTER — Other Ambulatory Visit: Payer: Self-pay

## 2021-04-30 VITALS — BP 114/78 | HR 76 | Temp 98.0°F | Ht 63.6 in | Wt 167.2 lb

## 2021-04-30 DIAGNOSIS — I129 Hypertensive chronic kidney disease with stage 1 through stage 4 chronic kidney disease, or unspecified chronic kidney disease: Secondary | ICD-10-CM | POA: Diagnosis not present

## 2021-04-30 DIAGNOSIS — N182 Chronic kidney disease, stage 2 (mild): Secondary | ICD-10-CM | POA: Diagnosis not present

## 2021-04-30 DIAGNOSIS — E7800 Pure hypercholesterolemia, unspecified: Secondary | ICD-10-CM

## 2021-04-30 DIAGNOSIS — Z23 Encounter for immunization: Secondary | ICD-10-CM

## 2021-04-30 DIAGNOSIS — L659 Nonscarring hair loss, unspecified: Secondary | ICD-10-CM | POA: Diagnosis not present

## 2021-04-30 DIAGNOSIS — R7309 Other abnormal glucose: Secondary | ICD-10-CM

## 2021-04-30 DIAGNOSIS — Z6829 Body mass index (BMI) 29.0-29.9, adult: Secondary | ICD-10-CM

## 2021-04-30 MED ORDER — SHINGRIX 50 MCG/0.5ML IM SUSR: 0.5000 mL | Freq: Once | INTRAMUSCULAR | 0 refills | Status: AC

## 2021-04-30 NOTE — Patient Instructions (Signed)

## 2021-04-30 NOTE — Progress Notes (Signed)
Rich Brave Llittleton,acting as a Education administrator for Maximino Greenland, MD.,have documented all relevant documentation on the behalf of Maximino Greenland, MD,as directed by  Maximino Greenland, MD while in the presence of Maximino Greenland, MD.  This visit occurred during the SARS-CoV-2 public health emergency.  Safety protocols were in place, including screening questions prior to the visit, additional usage of staff PPE, and extensive cleaning of exam room while observing appropriate contact time as indicated for disinfecting solutions.  Subjective:     Patient ID: Morgan Norman , female    DOB: 07-Mar-1956 , 66 y.o.   MRN: [id number]   Chief Complaint  Patient presents with   Hyperlipidemia   Hypertension    HPI  She is here today for bp check/chol check. She reports compliance with meds. She is now exercising regularly. She denies headaches, chest pain and shortness of breath.   Hyperlipidemia This is a chronic problem. The current episode started more than 1 year ago. Exacerbating diseases include chronic renal disease. She has no history of hypothyroidism or obesity. Pertinent negatives include no chest pain, myalgias or shortness of breath. Current antihyperlipidemic treatment includes statins. The current treatment provides moderate improvement of lipids. Risk factors for coronary artery disease include hypertension and post-menopausal.  Hypertension This is a chronic problem. The current episode started more than 1 year ago. The problem has been gradually improving since onset. The problem is controlled. Pertinent negatives include no blurred vision, chest pain, palpitations or shortness of breath. The current treatment provides moderate improvement. Identifiable causes of hypertension include chronic renal disease.    Past Medical History:  Diagnosis Date   Benign hypertensive renal disease    CKD (chronic kidney disease) stage 2, GFR 60-89 ml/min    Hypertension    Migraines     Overweight    Pure hypercholesterolemia    Vitamin D deficiency      Family History  Problem Relation Age of Onset   Diabetes Mellitus I Mother    Bladder Cancer Mother    Dementia Mother    Alcoholism Father      Current Outpatient Medications:    Cholecalciferol 5000 UNITS TABS, Take 5,000 Units by mouth., Disp: , Rfl:    cyclobenzaprine (FLEXERIL) 5 MG tablet, Take 1 tablet (5 mg total) by mouth daily as needed for muscle spasms., Disp: 30 tablet, Rfl: 0   diazepam (VALIUM) 5 MG tablet, Take 1 tablet (5 mg total) by mouth every 8 (eight) hours as needed for anxiety or muscle spasms., Disp: 20 tablet, Rfl: 0   ELDERBERRY PO, Take 1,000 mg by mouth., Disp: , Rfl:    Ginkgo Biloba (GINKOBA PO), Take by mouth., Disp: , Rfl:    loratadine (CLARITIN) 10 MG tablet, Take 10 mg by mouth daily as needed for allergies., Disp: , Rfl:    meclizine (ANTIVERT) 25 MG tablet, Take 1 tablet (25 mg total) by mouth 3 (three) times daily as needed for dizziness., Disp: 30 tablet, Rfl: 0   Multiple Vitamins-Minerals (IMMUNE SUPPORT PO), Take 1,000 mg by mouth daily at 6 (six) AM., Disp: , Rfl:    nystatin cream (MYCOSTATIN), Apply topically 3 (three) times daily., Disp: , Rfl:    Omega-3 Fatty Acids (FISH OIL PO), Take 120 mg by mouth., Disp: , Rfl:    rosuvastatin (CRESTOR) 10 MG tablet, TAKE 1 TABLET(10 MG) BY MOUTH DAILY, Disp: 90 tablet, Rfl: 2   telmisartan (MICARDIS) 20 MG tablet, Take 1 tablet (20 mg  total) by mouth daily., Disp: 90 tablet, Rfl: 1   triamcinolone cream (KENALOG) 0.1 %, Apply 1 application topically 2 (two) times daily., Disp: , Rfl:    Zoster Vaccine Adjuvanted (SHINGRIX) injection, Inject 0.5 mLs into the muscle once for 1 dose., Disp: 0.5 mL, Rfl: 0   No Known Allergies   Review of Systems  Constitutional: Negative.   Eyes: Negative.  Negative for blurred vision.  Respiratory:  Negative for shortness of breath.   Cardiovascular:  Negative for chest pain and palpitations.   Musculoskeletal:  Negative for myalgias.  Skin: Negative.   Psychiatric/Behavioral: Negative.      Today's Vitals   04/30/21 1059  BP: 114/78  Pulse: 76  Temp: 98 F (36.7 C)  Weight: 167 lb 3.2 oz (75.8 kg)  Height: 5' 3.6" (1.615 m)  PainSc: 0-No pain   Body mass index is 29.06 kg/m.  Wt Readings from Last 3 Encounters:  04/30/21 167 lb 3.2 oz (75.8 kg)  10/11/20 160 lb (72.6 kg)  10/11/20 160 lb (72.6 kg)     Objective:  Physical Exam Vitals and nursing note reviewed.  Constitutional:      Appearance: Normal appearance.  HENT:     Head: Normocephalic and atraumatic.     Nose:     Comments: Masked     Mouth/Throat:     Comments: Masked  Eyes:     Extraocular Movements: Extraocular movements intact.  Cardiovascular:     Rate and Rhythm: Normal rate and regular rhythm.     Heart sounds: Normal heart sounds.  Pulmonary:     Effort: Pulmonary effort is normal.     Breath sounds: Normal breath sounds.  Musculoskeletal:     Cervical back: Normal range of motion.  Skin:    General: Skin is warm.     Comments: Hair thinning present  Neurological:     General: No focal deficit present.     Mental Status: She is alert.  Psychiatric:        Mood and Affect: Mood normal.        Behavior: Behavior normal.        Assessment And Plan:     1. Pure hypercholesterolemia Comments: Chronic, reports compliance with meds. I will check lipid panel and LFTs today.  - Lipid panel  2. Hypertensive nephropathy Comments: Chronic, well controlled. No med changes. Encouraged to follow low sodium diet.  She will rto in six months for re-evaluation.  - CMP14+EGFR  3. Chronic renal disease, stage II Comments: Chronic, I will check GFR/Cr today. She is encouraged to stay well hydrated, keep BP controlled and avoid NSAIDs.  - CMP14+EGFR  4. Hair thinning Comments: I will refer her to Derm for further evaluation.  - Ambulatory referral to Dermatology - ANA, IFA (with  reflex) - TSH - Iron, TIBC and Ferritin Panel  5. Other abnormal glucose Comments: Her a1c has been elevated in the past. I will check an a1c today. She is encouraged to limit her intake of sweetened beverages, and sugary foods.  - Hemoglobin A1c  6. BMI 29.0-29.9,adult Comments: She is encouraged to aim for at least 150 minutes of exercise per week.   7. Immunization due Comments: I will send rx Shingrix to her local pharmacy.    Patient was given opportunity to ask questions. Patient verbalized understanding of the plan and was able to repeat key elements of the plan. All questions were answered to their satisfaction.   I, Maximino Greenland,  MD, have reviewed all documentation for this visit. The documentation on 04/30/21 for the exam, diagnosis, procedures, and orders are all accurate and complete.   IF YOU HAVE BEEN REFERRED TO A SPECIALIST, IT MAY TAKE 1-2 WEEKS TO SCHEDULE/PROCESS THE REFERRAL. IF YOU HAVE NOT HEARD FROM US/SPECIALIST IN TWO WEEKS, PLEASE GIVE [place] A CALL AT [phone].   THE PATIENT IS ENCOURAGED TO PRACTICE SOCIAL DISTANCING DUE TO THE COVID-19 PANDEMIC.

## 2021-05-02 LAB — TSH: TSH: 1.76 u[IU]/mL (ref 0.450–4.500)

## 2021-05-02 LAB — CMP14+EGFR
ALT: 13 IU/L (ref 0–32)
AST: 24 IU/L (ref 0–40)
Albumin/Globulin Ratio: 2.1 (ref 1.2–2.2)
Albumin: 4.5 g/dL (ref 3.8–4.8)
Alkaline Phosphatase: 76 IU/L (ref 44–121)
BUN/Creatinine Ratio: 10 — ABNORMAL LOW (ref 12–28)
BUN: 9 mg/dL (ref 8–27)
Bilirubin Total: 0.4 mg/dL (ref 0.0–1.2)
CO2: 26 mmol/L (ref 20–29)
Calcium: 9.1 mg/dL (ref 8.7–10.3)
Chloride: 101 mmol/L (ref 96–106)
Creatinine, Ser: 0.9 mg/dL (ref 0.57–1.00)
Globulin, Total: 2.1 g/dL (ref 1.5–4.5)
Glucose: 86 mg/dL (ref 70–99)
Potassium: 4.1 mmol/L (ref 3.5–5.2)
Sodium: 140 mmol/L (ref 134–144)
Total Protein: 6.6 g/dL (ref 6.0–8.5)
eGFR: 70 mL/min/{1.73_m2} (ref >59)

## 2021-05-02 LAB — LIPID PANEL
Chol/HDL Ratio: 2.4 ratio (ref 0.0–4.4)
Cholesterol, Total: 157 mg/dL (ref 100–199)
HDL: 65 mg/dL (ref >39)
LDL Chol Calc (NIH): 81 mg/dL (ref 0–99)
Triglycerides: 51 mg/dL (ref 0–149)
VLDL Cholesterol Cal: 11 mg/dL (ref 5–40)

## 2021-05-02 LAB — IRON,TIBC AND FERRITIN PANEL
Ferritin: 101 ng/mL (ref 15–150)
Iron Saturation: 26 % (ref 15–55)
Iron: 73 ug/dL (ref 27–139)
Total Iron Binding Capacity: 277 ug/dL (ref 250–450)
UIBC: 204 ug/dL (ref 118–369)

## 2021-05-02 LAB — HEMOGLOBIN A1C
Est. average glucose Bld gHb Est-mCnc: 117 mg/dL
Hgb A1c MFr Bld: 5.7 % — ABNORMAL HIGH (ref 4.8–5.6)

## 2021-05-02 LAB — ANTINUCLEAR ANTIBODIES, IFA: ANA Titer 1: NEGATIVE

## 2021-05-04 ENCOUNTER — Encounter: Payer: Self-pay | Admitting: Internal Medicine

## 2021-05-04 LAB — HM DEXA SCAN

## 2021-05-08 ENCOUNTER — Encounter: Payer: Self-pay | Admitting: Internal Medicine

## 2021-05-09 ENCOUNTER — Telehealth: Payer: Self-pay

## 2021-05-09 NOTE — Telephone Encounter (Signed)
The pt was notified that Dr. Baird Cancer said her bone density scan shows osteopenia and to engage in weight bearing exercises, include strength training, take calcium and vitamin d.

## 2021-05-10 ENCOUNTER — Other Ambulatory Visit: Payer: Self-pay

## 2021-05-10 ENCOUNTER — Encounter: Payer: Self-pay | Admitting: Internal Medicine

## 2021-05-10 MED ORDER — TELMISARTAN 20 MG PO TABS: 20.0000 mg | ORAL_TABLET | Freq: Every day | ORAL | 1 refills | Status: DC

## 2021-05-15 ENCOUNTER — Encounter: Payer: Self-pay | Admitting: Internal Medicine

## 2021-06-22 DIAGNOSIS — L905 Scar conditions and fibrosis of skin: Secondary | ICD-10-CM | POA: Diagnosis not present

## 2021-06-22 DIAGNOSIS — L648 Other androgenic alopecia: Secondary | ICD-10-CM | POA: Diagnosis not present

## 2021-07-05 ENCOUNTER — Other Ambulatory Visit: Payer: Self-pay | Admitting: Internal Medicine

## 2021-07-07 ENCOUNTER — Other Ambulatory Visit: Payer: Self-pay | Admitting: Internal Medicine

## 2021-08-16 ENCOUNTER — Other Ambulatory Visit: Payer: Self-pay

## 2021-08-16 ENCOUNTER — Encounter: Payer: Self-pay | Admitting: Internal Medicine

## 2021-08-16 MED ORDER — TRIAMCINOLONE ACETONIDE 0.1 % EX CREA: 1.0000 "application " | TOPICAL_CREAM | Freq: Two times a day (BID) | CUTANEOUS | 1 refills | Status: AC

## 2021-08-16 MED ORDER — NYSTATIN 100000 UNIT/GM EX CREA: TOPICAL_CREAM | Freq: Three times a day (TID) | CUTANEOUS | 1 refills | Status: DC

## 2021-08-28 ENCOUNTER — Ambulatory Visit: Payer: Medicare PPO | Admitting: Internal Medicine

## 2021-08-28 ENCOUNTER — Encounter: Payer: Self-pay | Admitting: Internal Medicine

## 2021-08-28 ENCOUNTER — Ambulatory Visit (INDEPENDENT_AMBULATORY_CARE_PROVIDER_SITE_OTHER): Payer: Medicare PPO | Admitting: Internal Medicine

## 2021-08-28 VITALS — BP 116/82 | HR 63 | Temp 98.0°F | Ht 63.8 in | Wt 163.4 lb

## 2021-08-28 DIAGNOSIS — M79622 Pain in left upper arm: Secondary | ICD-10-CM

## 2021-08-28 DIAGNOSIS — N182 Chronic kidney disease, stage 2 (mild): Secondary | ICD-10-CM | POA: Diagnosis not present

## 2021-08-28 DIAGNOSIS — Z6828 Body mass index (BMI) 28.0-28.9, adult: Secondary | ICD-10-CM

## 2021-08-28 DIAGNOSIS — H9201 Otalgia, right ear: Secondary | ICD-10-CM

## 2021-08-28 DIAGNOSIS — I129 Hypertensive chronic kidney disease with stage 1 through stage 4 chronic kidney disease, or unspecified chronic kidney disease: Secondary | ICD-10-CM

## 2021-08-28 NOTE — Addendum Note (Signed)
Addended by: [name] on: 08/28/2021 10:40 PM ? ? Modules accepted: Level of Service ? ?

## 2021-08-28 NOTE — Progress Notes (Signed)
?Rich Brave Llittleton,acting as a Education administrator for Maximino Greenland, MD.,have documented all relevant documentation on the behalf of Maximino Greenland, MD,as directed by  Maximino Greenland, MD while in the presence of Maximino Greenland, MD.  ?This visit occurred during the SARS-CoV-2 public health emergency.  Safety protocols were in place, including screening questions prior to the visit, additional usage of staff PPE, and extensive cleaning of exam room while observing appropriate contact time as indicated for disinfecting solutions. ? ?Subjective:  ?  ? Patient ID: Morgan Norman , female    DOB: 1956-03-18 , 66 y.o.   MRN: [id number] ? ? ?Chief Complaint  ?Patient presents with  ? Arm Pain  ? Ear Pain  ? ? ?HPI ? ?Patient presents today for left arm pain. She denies fall/trauma.  She reports the pain has been there since March. She is not sure what initially triggered her sx. She states the pain is located on the lateral side of her left arm. She states she has been going to the gym regularly since January. She is doing 2-3 classes/day and walks regularly. She admits certain movements aggravate her symptoms. She has taken Aleve with minimal relief of her sx.  ? ?She complains of right ear pain as well. Her ear started hurting at the beginning of April. She reports the pain is an ache and it comes and goes. She has tried using some eardrops and it helps to relieve the pain some.  ? ?Arm Pain  ?The incident occurred more than 1 week ago. There was no injury mechanism. The pain is present in the upper left arm. The quality of the pain is described as aching. The pain does not radiate. The pain is moderate. The pain has been Constant since the incident. The symptoms are aggravated by lifting. She has tried NSAIDs (aleve) for the symptoms. The treatment provided no relief.   ? ?Past Medical History:  ?Diagnosis Date  ? Benign hypertensive renal disease   ? CKD (chronic kidney disease) stage 2, GFR 60-89 ml/min   ?  Hypertension   ? Migraines   ? Overweight   ? Pure hypercholesterolemia   ? Vitamin D deficiency   ?  ? ?Family History  ?Problem Relation Age of Onset  ? Diabetes Mellitus I Mother   ? Bladder Cancer Mother   ? Dementia Mother   ? Alcoholism Father   ? ? ? ?Current Outpatient Medications:  ?  Cholecalciferol 5000 UNITS TABS, Take 5,000 Units by mouth., Disp: , Rfl:  ?  cyclobenzaprine (FLEXERIL) 5 MG tablet, Take 1 tablet (5 mg total) by mouth daily as needed for muscle spasms., Disp: 30 tablet, Rfl: 0 ?  ELDERBERRY PO, Take 1,000 mg by mouth., Disp: , Rfl:  ?  Ginkgo Biloba (GINKOBA PO), Take by mouth., Disp: , Rfl:  ?  loratadine (CLARITIN) 10 MG tablet, Take 10 mg by mouth daily as needed for allergies., Disp: , Rfl:  ?  meclizine (ANTIVERT) 25 MG tablet, Take 1 tablet (25 mg total) by mouth 3 (three) times daily as needed for dizziness., Disp: 30 tablet, Rfl: 0 ?  Multiple Vitamins-Minerals (IMMUNE SUPPORT PO), Take 1,000 mg by mouth daily at 6 (six) AM., Disp: , Rfl:  ?  nystatin cream (MYCOSTATIN), Apply topically 3 (three) times daily., Disp: 30 g, Rfl: 1 ?  Omega-3 Fatty Acids (FISH OIL PO), Take 120 mg by mouth., Disp: , Rfl:  ?  rosuvastatin (CRESTOR) 10 MG tablet, TAKE 1  TABLET(10 MG) BY MOUTH DAILY, Disp: 90 tablet, Rfl: 2 ?  telmisartan (MICARDIS) 20 MG tablet, TAKE 1 TABLET(20 MG) BY MOUTH DAILY, Disp: 90 tablet, Rfl: 1 ?  triamcinolone cream (KENALOG) 0.1 %, Apply 1 application. topically 2 (two) times daily., Disp: 30 g, Rfl: 1 ?  diazepam (VALIUM) 5 MG tablet, Take 1 tablet (5 mg total) by mouth every 8 (eight) hours as needed for anxiety or muscle spasms. (Patient not taking: Reported on 08/28/2021), Disp: 20 tablet, Rfl: 0 ?  scopolamine (TRANSDERM-SCOP) 1 MG/3DAYS, APPLY 1 PATCH(1.5 MG) TOPICALLY TO THE SKIN EVERY 3 DAYS (Patient not taking: Reported on 08/28/2021), Disp: 4 patch, Rfl: 0  ? ?No Known Allergies  ? ?Review of Systems  ?Constitutional: Negative.   ?HENT:  Positive for ear pain.  Negative for sinus pressure, sneezing, sore throat and tinnitus.   ?     She also has right ear pain. Not sure what must have triggered her sx. She has started Flonase pills which have greatly improved her sx. She has been evaluated by ENT; however,she states she was not given a diagnosis/treatment plan for recurrence of sx.   ?Respiratory: Negative.    ?Cardiovascular: Negative.   ?Gastrointestinal: Negative.   ?Musculoskeletal:  Positive for arthralgias.  ?Neurological: Negative.   ?Psychiatric/Behavioral: Negative.     ? ?Today's Vitals  ? 08/28/21 0919  ?BP: 116/82  ?Pulse: 63  ?Temp: 98 ?F (36.7 ?C)  ?Weight: 163 lb 6.4 oz (74.1 kg)  ?Height: 5' 3.8" (1.621 m)  ?PainSc: 6   ?PainLoc: Arm  ? ?Body mass index is 28.22 kg/m?.  ?Wt Readings from Last 3 Encounters:  ?08/28/21 163 lb 6.4 oz (74.1 kg)  ?04/30/21 167 lb 3.2 oz (75.8 kg)  ?10/11/20 160 lb (72.6 kg)  ?  ? ?Objective:  ?Physical Exam ?Vitals and nursing note reviewed.  ?Constitutional:   ?   Appearance: Normal appearance.  ?HENT:  ?   Head: Normocephalic and atraumatic.  ?   Right Ear: Ear canal and external ear normal. There is no impacted cerumen.  ?   Left Ear: Tympanic membrane, ear canal and external ear normal. There is no impacted cerumen.  ?   Ears:  ?   Comments: Scarring R TM ?Cardiovascular:  ?   Rate and Rhythm: Normal rate and regular rhythm.  ?   Heart sounds: Normal heart sounds.  ?Pulmonary:  ?   Effort: Pulmonary effort is normal.  ?   Breath sounds: Normal breath sounds.  ?Musculoskeletal:     ?   General: Tenderness present.  ?   Comments: Decreased ROM left arm  ?Skin: ?   General: Skin is warm.  ?Neurological:  ?   General: No focal deficit present.  ?   Mental Status: She is alert.  ?Psychiatric:     ?   Mood and Affect: Mood normal.     ?   Behavior: Behavior normal.  ?   ?Assessment And Plan:  ?   ?1. Left upper arm pain ?Comments: Possibly due to rotator cuff tendinitis. I will refer her to Ortho as requested. Pt advised she may  need PT as well. ?- Ambulatory referral to Orthopedic Surgery ? ?2. Right ear pain ?Comments: She will c/w Flonase pills; however, adivsed not to use more than 4-5 days in a row due to acetaminophen content.  ? ?3. Hypertensive nephropathy ?Comments: Chronic, well controlled. Pt advised that Flonase pills have phenylephrine, a decongestant. Increased use may aggravate her HTN, again  advised to use sparingly. ? ?4. Chronic renal disease, stage II ?Comments: Chronic, this has been stable.  I will check GFR at her next visit.  ? ?5. BMI 28.0-28.9,adult ?Comments: She was commended on her lifestyle changes.  ?  ?Patient was given opportunity to ask questions. Patient verbalized understanding of the plan and was able to repeat key elements of the plan. All questions were answered to their satisfaction.  ? ?I, Maximino Greenland, MD, have reviewed all documentation for this visit. The documentation on 08/28/21 for the exam, diagnosis, procedures, and orders are all accurate and complete.  ? ?IF YOU HAVE BEEN REFERRED TO A SPECIALIST, IT MAY TAKE 1-2 WEEKS TO SCHEDULE/PROCESS THE REFERRAL. IF YOU HAVE NOT HEARD FROM US/SPECIALIST IN TWO WEEKS, PLEASE GIVE [place] A CALL AT [phone].  ? ?THE PATIENT IS ENCOURAGED TO PRACTICE SOCIAL DISTANCING DUE TO THE COVID-19 PANDEMIC.   ?

## 2021-08-28 NOTE — Patient Instructions (Signed)
Rotator Cuff Tendinitis  Rotator cuff tendinitis is inflammation of the tendons in the rotator cuff. Tendons are tough, cord-like bands that connect muscle to bone. The rotator cuff includes all of the muscles and tendons that connect the arm to the shoulder. The rotator cuff holds the head of the humerus, or the upper arm bone, in the cup of the shoulder blade (scapula). This condition can lead to a long-term or chronic tear. The tear may be partial or complete. What are the causes? This condition is usually caused by overusing the rotator cuff. What increases the risk? This condition is more likely to develop in athletes and workers who frequently use their shoulder or reach over their heads. This can include activities such as: Tennis. Baseball or softball. Swimming. Construction work. Painting. What are the signs or symptoms? Symptoms of this condition include: Pain that spreads (radiates) from the shoulder to the upper arm. Swelling and tenderness in front of the shoulder. Pain when reaching, pulling, or lifting the arm above the head. Pain when lowering the arm from above the head. Minor pain in the shoulder when resting. Increased pain in the shoulder at night. Difficulty placing the arm behind the back. How is this diagnosed? This condition is diagnosed with a physical exam and medical history. Tests may also be done, including: X-rays. MRI. Ultrasound. CT with or without contrast. How is this treated? Treatment for this condition depends on the severity of the condition. In less severe cases, treatment may include: Rest. This may be done with a sling that holds the shoulder still (immobilization). Your health care provider may also recommend avoiding activities that involve lifting your arm over your head. Icing the shoulder. Anti-inflammatory medicines, such as aspirin or ibuprofen. In more severe cases, treatment may include: Physical therapy. Steroid  injections. Surgery. Follow these instructions at home: If you have a sling: Wear the sling as told by your health care provider. Remove it only as told by your health care provider. Loosen it if your fingers tingle, become numb, or turn cold and blue. Keep it clean. If the sling is not waterproof: Do not let it get wet. Cover it with a watertight covering when you take a bath or shower. Managing pain, stiffness, and swelling  If directed, put ice on the injured area. To do this: If you have a removable sling, remove it as told by your health care provider. Put ice in a plastic bag. Place a towel between your skin and the bag. Leave the ice on for 20 minutes, 2-3 times a day. Move your fingers often to reduce stiffness and swelling. Raise (elevate) the injured area above the level of your heart while you are lying down. Find a comfortable sleeping position, or sleep in a recliner, if available. Activity Rest your shoulder as told by your health care provider. Ask your health care provider when it is safe to drive if you have a sling on your arm. Return to your normal activities as told by your health care provider. Ask your health care provider what activities are safe for you. Do any exercises or stretches as told by your health care provider or physical therapist. If you do repetitive overhead tasks, take small breaks in between and include stretching exercises as told by your health care provider. General instructions Do not use any products that contain nicotine or tobacco, such as cigarettes, e-cigarettes, and chewing tobacco. These can delay healing. If you need help quitting, ask your health care provider.   Take over-the-counter and prescription medicines only as told by your health care provider. Keep all follow-up visits as told by your health care provider. This is important. Contact a health care provider if: Your pain gets worse. You have new pain in your arm, hands, or  fingers. Your pain is not relieved with medicine or does not get better after 6 weeks of treatment. You have crackling sensations when moving your shoulder in certain directions. You hear a snapping sound after using your shoulder, followed by severe pain and weakness. Get help right away if: Your arm, hand, or fingers are numb or tingling. Your arm, hand, or fingers are swollen or painful or they turn white or blue. Summary Rotator cuff tendinitis is inflammation of the tendons in the rotator cuff. Tendons are tough, cord-like bands that connect muscle to bone. This condition is usually caused by overusing the rotator cuff, which includes all of the muscles and tendons that connect the arm to the shoulder. This condition is more likely to develop in athletes and workers who frequently use their shoulder or reach over their heads. Treatment generally includes rest, anti-inflammatory medicines, and icing. In some cases, physical therapy and steroid injections may be needed. In severe cases, surgery may be needed. This information is not intended to replace advice given to you by your health care provider. Make sure you discuss any questions you have with your health care provider. Document Revised: 01/11/2019 Document Reviewed: 01/11/2019 Elsevier Patient Education  2023 [employer]  

## 2021-08-30 DIAGNOSIS — E7849 Other hyperlipidemia: Secondary | ICD-10-CM | POA: Diagnosis not present

## 2021-08-30 DIAGNOSIS — R079 Chest pain, unspecified: Secondary | ICD-10-CM | POA: Diagnosis not present

## 2021-08-30 DIAGNOSIS — R0789 Other chest pain: Secondary | ICD-10-CM | POA: Diagnosis not present

## 2021-08-30 DIAGNOSIS — Z79899 Other long term (current) drug therapy: Secondary | ICD-10-CM | POA: Diagnosis not present

## 2021-08-30 DIAGNOSIS — E785 Hyperlipidemia, unspecified: Secondary | ICD-10-CM | POA: Diagnosis not present

## 2021-08-30 DIAGNOSIS — I10 Essential (primary) hypertension: Secondary | ICD-10-CM | POA: Diagnosis not present

## 2021-08-31 ENCOUNTER — Telehealth: Payer: Self-pay

## 2021-08-31 NOTE — Telephone Encounter (Signed)
[facility]-error.  

## 2021-08-31 NOTE — Telephone Encounter (Signed)
Spoke with pt after she visited [address]. Pt reports the ER putting in a referral for cardiologist, she does not feel the need of an appointment at this time. The specialist patient reports will give her a call within 72 hours. Pt aware, if any questions or concerns to give the office a call.  ?

## 2021-09-03 DIAGNOSIS — I10 Essential (primary) hypertension: Secondary | ICD-10-CM | POA: Diagnosis not present

## 2021-09-03 DIAGNOSIS — R079 Chest pain, unspecified: Secondary | ICD-10-CM | POA: Diagnosis not present

## 2021-09-04 DIAGNOSIS — I10 Essential (primary) hypertension: Secondary | ICD-10-CM | POA: Diagnosis not present

## 2021-09-04 DIAGNOSIS — G8929 Other chronic pain: Secondary | ICD-10-CM | POA: Diagnosis not present

## 2021-09-04 DIAGNOSIS — R079 Chest pain, unspecified: Secondary | ICD-10-CM | POA: Diagnosis not present

## 2021-09-04 DIAGNOSIS — M7502 Adhesive capsulitis of left shoulder: Secondary | ICD-10-CM | POA: Diagnosis not present

## 2021-09-04 DIAGNOSIS — M25512 Pain in left shoulder: Secondary | ICD-10-CM | POA: Diagnosis not present

## 2021-09-04 DIAGNOSIS — M542 Cervicalgia: Secondary | ICD-10-CM | POA: Diagnosis not present

## 2021-09-14 ENCOUNTER — Encounter: Payer: Self-pay | Admitting: Internal Medicine

## 2021-10-08 DIAGNOSIS — M25512 Pain in left shoulder: Secondary | ICD-10-CM | POA: Diagnosis not present

## 2021-10-08 DIAGNOSIS — M542 Cervicalgia: Secondary | ICD-10-CM | POA: Diagnosis not present

## 2021-10-08 DIAGNOSIS — M7502 Adhesive capsulitis of left shoulder: Secondary | ICD-10-CM | POA: Diagnosis not present

## 2021-10-08 DIAGNOSIS — G8929 Other chronic pain: Secondary | ICD-10-CM | POA: Diagnosis not present

## 2021-10-18 DIAGNOSIS — M25512 Pain in left shoulder: Secondary | ICD-10-CM | POA: Diagnosis not present

## 2021-10-18 DIAGNOSIS — G8929 Other chronic pain: Secondary | ICD-10-CM | POA: Diagnosis not present

## 2021-10-18 DIAGNOSIS — M542 Cervicalgia: Secondary | ICD-10-CM | POA: Diagnosis not present

## 2021-10-18 DIAGNOSIS — M7502 Adhesive capsulitis of left shoulder: Secondary | ICD-10-CM | POA: Diagnosis not present

## 2021-10-25 DIAGNOSIS — G8929 Other chronic pain: Secondary | ICD-10-CM | POA: Diagnosis not present

## 2021-10-25 DIAGNOSIS — R2689 Other abnormalities of gait and mobility: Secondary | ICD-10-CM | POA: Diagnosis not present

## 2021-10-25 DIAGNOSIS — R6889 Other general symptoms and signs: Secondary | ICD-10-CM | POA: Diagnosis not present

## 2021-10-25 DIAGNOSIS — M542 Cervicalgia: Secondary | ICD-10-CM | POA: Diagnosis not present

## 2021-10-25 DIAGNOSIS — R293 Abnormal posture: Secondary | ICD-10-CM | POA: Diagnosis not present

## 2021-10-25 DIAGNOSIS — M7502 Adhesive capsulitis of left shoulder: Secondary | ICD-10-CM | POA: Diagnosis not present

## 2021-10-25 DIAGNOSIS — M25512 Pain in left shoulder: Secondary | ICD-10-CM | POA: Diagnosis not present

## 2021-11-01 DIAGNOSIS — R293 Abnormal posture: Secondary | ICD-10-CM | POA: Diagnosis not present

## 2021-11-01 DIAGNOSIS — R6889 Other general symptoms and signs: Secondary | ICD-10-CM | POA: Diagnosis not present

## 2021-11-01 DIAGNOSIS — M7502 Adhesive capsulitis of left shoulder: Secondary | ICD-10-CM | POA: Diagnosis not present

## 2021-11-01 DIAGNOSIS — R2689 Other abnormalities of gait and mobility: Secondary | ICD-10-CM | POA: Diagnosis not present

## 2021-11-01 DIAGNOSIS — G8929 Other chronic pain: Secondary | ICD-10-CM | POA: Diagnosis not present

## 2021-11-01 DIAGNOSIS — M25512 Pain in left shoulder: Secondary | ICD-10-CM | POA: Diagnosis not present

## 2021-11-01 DIAGNOSIS — M542 Cervicalgia: Secondary | ICD-10-CM | POA: Diagnosis not present

## 2021-11-07 ENCOUNTER — Ambulatory Visit (INDEPENDENT_AMBULATORY_CARE_PROVIDER_SITE_OTHER): Payer: Medicare PPO

## 2021-11-07 ENCOUNTER — Ambulatory Visit: Payer: Medicare Other | Admitting: Internal Medicine

## 2021-11-07 VITALS — BP 122/64 | HR 66 | Temp 98.1°F | Ht 66.0 in | Wt 165.4 lb

## 2021-11-07 DIAGNOSIS — Z0000 Encounter for general adult medical examination without abnormal findings: Secondary | ICD-10-CM | POA: Diagnosis not present

## 2021-11-07 NOTE — Progress Notes (Signed)
Subjective:   Morgan Norman is a 66 y.o. female who presents for Medicare Annual (Subsequent) preventive examination.  Review of Systems     Cardiac Risk Factors include: advanced age (>75men, >55 women);hypertension     Objective:    Today's Vitals   11/07/21 1406  BP: 122/64  Pulse: 66  Temp: 98.1 F (36.7 C)  TempSrc: Oral  SpO2: 99%  Weight: 165 lb 6.4 oz (75 kg)  Height: 5\' 6"  (1.676 m)   Body mass index is 26.7 kg/m.     11/07/2021    2:16 PM 10/11/2020   12:00 PM 10/07/2019   11:37 AM 10/06/2018   10:54 AM  Advanced Directives  Does Patient Have a Medical Advance Directive? Yes Yes Yes Yes  Type of 10/08/2018 of [place];Living will Healthcare Power of [place];Living will Healthcare Power of [place];Living will Living will  Does patient want to make changes to medical advance directive?    No - Patient declined  Copy of Healthcare Power of Attorney in Chart? No - copy requested No - copy requested No - copy requested     Current Medications (verified) Outpatient Encounter Medications as of 11/07/2021  Medication Sig   Cholecalciferol 5000 UNITS TABS Take 5,000 Units by mouth.   cyclobenzaprine (FLEXERIL) 5 MG tablet Take 1 tablet (5 mg total) by mouth daily as needed for muscle spasms.   ELDERBERRY PO Take 1,000 mg by mouth.   Ginkgo Biloba (GINKOBA PO) Take by mouth.   loratadine (CLARITIN) 10 MG tablet Take 10 mg by mouth daily as needed for allergies.   meclizine (ANTIVERT) 25 MG tablet Take 1 tablet (25 mg total) by mouth 3 (three) times daily as needed for dizziness.   Multiple Vitamins-Minerals (IMMUNE SUPPORT PO) Take 1,000 mg by mouth daily at 6 (six) AM.   nystatin cream (MYCOSTATIN) Apply topically 3 (three) times daily.   Omega-3 Fatty Acids (FISH OIL PO) Take 120 mg by mouth.   rosuvastatin (CRESTOR) 10 MG tablet TAKE 1 TABLET(10 MG) BY MOUTH DAILY   telmisartan (MICARDIS) 20 MG tablet TAKE 1 TABLET(20 MG) BY MOUTH  DAILY   triamcinolone cream (KENALOG) 0.1 % Apply 1 application. topically 2 (two) times daily.   diazepam (VALIUM) 5 MG tablet Take 1 tablet (5 mg total) by mouth every 8 (eight) hours as needed for anxiety or muscle spasms. (Patient not taking: Reported on 08/28/2021)   scopolamine (TRANSDERM-SCOP) 1 MG/3DAYS APPLY 1 PATCH(1.5 MG) TOPICALLY TO THE SKIN EVERY 3 DAYS (Patient not taking: Reported on 08/28/2021)   No facility-administered encounter medications on file as of 11/07/2021.    Allergies (verified) Patient has no known allergies.   History: Past Medical History:  Diagnosis Date   Benign hypertensive renal disease    CKD (chronic kidney disease) stage 2, GFR 60-89 ml/min    Hypertension    Migraines    Overweight    Pure hypercholesterolemia    Vitamin D deficiency    Past Surgical History:  Procedure Laterality Date   bilateral foot surgery     CESAREAN SECTION     TOTAL ABDOMINAL HYSTERECTOMY W/ BILATERAL SALPINGOOPHORECTOMY     Family History  Problem Relation Age of Onset   Diabetes Mellitus I Mother    Bladder Cancer Mother    Dementia Mother    Alcoholism Father    Social History   Socioeconomic History   Marital status: Married    Spouse name: Not on file   Number of  children: Not on file   Years of education: Not on file   Highest education level: Not on file  Occupational History   Occupation: retired  Tobacco Use   Smoking status: Never   Smokeless tobacco: Never  Vaping Use   Vaping Use: Never used  Substance and Sexual Activity   Alcohol use: Not Currently    Alcohol/week: 0.0 standard drinks of alcohol    Comment: rarely   Drug use: No   Sexual activity: Yes  Other Topics Concern   Not on file  Social History Narrative   Drinks 2 cups of coffee daily.   Social Determinants of Health   Financial Resource Strain: Low Risk  (11/07/2021)   Overall Financial Resource Strain (CARDIA)    Difficulty of Paying Living Expenses: Not hard at all   Food Insecurity: No Food Insecurity (11/07/2021)   Hunger Vital Sign    Worried About Running Out of Food in the Last Year: Never true    Ran Out of Food in the Last Year: Never true  Transportation Needs: No Transportation Needs (11/07/2021)   PRAPARE - Hydrologist (Medical): No    Lack of Transportation (Non-Medical): No  Physical Activity: Sufficiently Active (11/07/2021)   Exercise Vital Sign    Days of Exercise per Week: 5 days    Minutes of Exercise per Session: 120 min  Stress: No Stress Concern Present (11/07/2021)   [place]    Feeling of Stress : Not at all  Social Connections: Not on file    Tobacco Counseling Counseling given: Not Answered   Clinical Intake:  Pre-visit preparation completed: Yes  Pain : No/denies pain     Nutritional Status: BMI 25 -29 Overweight Nutritional Risks: None Diabetes: No  How often do you need to have someone help you when you read instructions, pamphlets, or other written materials from your doctor or pharmacy?: 1 - Never What is the last grade level you completed in school?: college  Diabetic? no  Interpreter Needed?: No  Information entered by :: NAllen LPN   Activities of Daily Living    11/07/2021    2:17 PM  In your present state of health, do you have any difficulty performing the following activities:  Hearing? 0  Vision? 0  Difficulty concentrating or making decisions? 0  Walking or climbing stairs? 0  Dressing or bathing? 0  Doing errands, shopping? 0  Preparing Food and eating ? N  Using the Toilet? N  In the past six months, have you accidently leaked urine? Y  Do you have problems with loss of bowel control? N  Managing your Medications? N  Managing your Finances? N  Housekeeping or managing your Housekeeping? N    Patient Care Team: Glendale Chard, MD as PCP - General (Internal Medicine)  Indicate any  recent Medical Services you may have received from other than [facility] providers in the past year (date may be approximate).     Assessment:   This is a routine wellness examination for [facility].  Hearing/Vision screen Vision Screening - Comments:: Regular eye exams, [facility]  Dietary issues and exercise activities discussed: Current Exercise Habits: Structured exercise class, Type of exercise: walking;strength training/weights;calisthenics (water aerobics), Time (Minutes): > 60, Frequency (Times/Week): 5, Weekly Exercise (Minutes/Week): 0   Goals Addressed             This Visit's Progress    Patient Stated  11/07/2021, wants to weigh 160 pounds       Depression Screen    11/07/2021    2:17 PM 10/11/2020   12:01 PM 10/11/2020   11:45 AM 10/11/2019    4:40 PM 10/07/2019   11:38 AM 01/13/2019    2:20 PM 10/06/2018   10:17 AM  PHQ 2/9 Scores  PHQ - 2 Score 0 0 0 0 0 0 0  PHQ- 9 Score     0      Fall Risk    11/07/2021    2:17 PM 10/11/2020   12:01 PM 10/11/2020   11:46 AM 10/11/2019    4:39 PM 10/07/2019   11:38 AM  [place] in the past year? 0 0 0 0 0  Number falls in past yr: 0  0 0   Injury with Fall? 0  0 0   Risk for fall due to : Medication side effect Medication side effect   Medication side effect  Follow up Falls evaluation completed;Education provided;Falls prevention discussed Falls evaluation completed;Education provided;Falls prevention discussed   Falls evaluation completed;Education provided;Falls prevention discussed    FALL RISK PREVENTION PERTAINING TO THE HOME:  Any stairs in or around the home? Yes  If so, are there any without handrails? No  Home free of loose throw rugs in walkways, pet beds, electrical cords, etc? Yes  Adequate lighting in your home to reduce risk of falls? Yes   ASSISTIVE DEVICES UTILIZED TO PREVENT FALLS:  Life alert? No  Use of a cane, walker or w/c? No  Grab bars in the bathroom? No  Shower chair or bench in shower?  Yes  Elevated toilet seat or a handicapped toilet? Yes   TIMED UP AND GO:  Was the test performed? No .    Gait steady and fast without use of assistive device  Cognitive Function:        11/07/2021    2:18 PM 10/11/2020   12:02 PM 10/07/2019   11:40 AM 10/06/2018   10:19 AM  6CIT Screen  What Year? 0 points 0 points 0 points 0 points  What month? 0 points 0 points 0 points 0 points  What time? 0 points 0 points 0 points 0 points  Count back from 20 0 points 0 points 0 points 0 points  Months in reverse 0 points 0 points 0 points 0 points  Repeat phrase 0 points 2 points 0 points 0 points  Total Score 0 points 2 points 0 points 0 points    Immunizations Immunization History  Administered Date(s) Administered   DTaP 10/19/2013   Fluad Quad(high Dose 65+) 01/25/2019, 03/20/2020   Influenza,inj,Quad PF,6+ Mos 06/23/2018   PFIZER(Purple Top)SARS-COV-2 Vaccination 05/18/2019, 06/10/2019, 01/19/2020, 12/27/2020   Pneumococcal Polysaccharide-23 12/05/2014   Td 08/09/2005   Zoster Recombinat (Shingrix) 05/18/2021, 10/26/2021    TDAP status: Up to date  Flu Vaccine status: Up to date  Pneumococcal vaccine status: Due, Education has been provided regarding the importance of this vaccine. Advised may receive this vaccine at local pharmacy or Health Dept. Aware to provide a copy of the vaccination record if obtained from local pharmacy or Health Dept. Verbalized acceptance and understanding.  Covid-19 vaccine status: Completed vaccines  Qualifies for Shingles Vaccine? Yes   Zostavax completed Yes   Shingrix Completed?: Yes  Screening Tests Health Maintenance  Topic Date Due   Pneumonia Vaccine 18+ Years old (2 - PCV) 09/05/2018   COVID-19 Vaccine (5 - Booster for [employer] series)  02/21/2021   INFLUENZA VACCINE  11/20/2021   MAMMOGRAM  05/05/2023   TETANUS/TDAP  10/20/2023   COLONOSCOPY (Pts 45-62yrs Insurance coverage will need to be confirmed)  04/01/2028   DEXA SCAN   Completed   Hepatitis C Screening  Completed   Zoster Vaccines- Shingrix  Completed   HPV VACCINES  Aged Out    Health Maintenance  Health Maintenance Due  Topic Date Due   Pneumonia Vaccine 8+ Years old (2 - PCV) 09/05/2018   COVID-19 Vaccine (5 - Booster for Pf[employer] series) 02/21/2021    Colorectal cancer screening: Type of screening: Colonoscopy. Completed 04/01/2018. Repeat every 10 years  Mammogram status: Completed 05/04/2021. Repeat every year  Bone Density status: Completed 05/04/2021.   Lung Cancer Screening: (Low Dose CT Chest recommended if Age 16-80 years, 30 pack-year currently smoking OR have quit w/in 15years.) does not qualify.   Lung Cancer Screening Referral: no  Additional Screening:  Hepatitis C Screening: does qualify; Completed 08/24/2012  Vision Screening: Recommended annual ophthalmology exams for early detection of glaucoma and other disorders of the eye. Is the patient up to date with their annual eye exam?  Yes  Who is the provider or what is the name of the office in which the patient attends annual eye exams? [facility] If pt is not established with a provider, would they like to be referred to a provider to establish care? No .   Dental Screening: Recommended annual dental exams for proper oral hygiene  Community Resource Referral / Chronic Care Management: CRR required this visit?  No   CCM required this visit?  No      Plan:     I have personally reviewed and noted the following in the patient's chart:   Medical and social history Use of alcohol, tobacco or illicit drugs  Current medications and supplements including opioid prescriptions.  Functional ability and status Nutritional status Physical activity Advanced directives List of other physicians Hospitalizations, surgeries, and ER visits in previous 12 months Vitals Screenings to include cognitive, depression, and falls Referrals and appointments  In addition, I have reviewed and  discussed with patient certain preventive protocols, quality metrics, and best practice recommendations. A written personalized care plan for preventive services as well as general preventive health recommendations were provided to patient.     Barb Merino, LPN   10/30/6267   Nurse Notes: none

## 2021-11-07 NOTE — Patient Instructions (Signed)
Morgan Norman , Thank you for taking time to come for your Medicare Wellness Visit. I appreciate your ongoing commitment to your health goals. Please review the following plan we discussed and let me know if I can assist you in the future.   Screening recommendations/referrals: Colonoscopy: completed 04/01/2018, due 04/01/2028 Mammogram: completed 05/04/2021, due 05/05/2022 Bone Density: completed 05/04/2021 Recommended yearly ophthalmology/optometry visit for glaucoma screening and checkup Recommended yearly dental visit for hygiene and checkup  Vaccinations: Influenza vaccine: due 11/20/2021 Pneumococcal vaccine: due Tdap vaccine: completed 10/19/2013 Shingles vaccine: completed   Covid-19: 12/27/2020, 01/19/2020, 06/10/2019, 05/18/2019  Advanced directives: Please bring a copy of your POA (Power of Attorney) and/or Living Will to your next appointment.    Conditions/risks identified: none  Next appointment: Follow up in one year for your annual wellness visit    Preventive Care 65 Years and Older, Female Preventive care refers to lifestyle choices and visits with your health care provider that can promote health and wellness. What does preventive care include? A yearly physical exam. This is also called an annual well check. Dental exams once or twice a year. Routine eye exams. Ask your health care provider how often you should have your eyes checked. Personal lifestyle choices, including: Daily care of your teeth and gums. Regular physical activity. Eating a healthy diet. Avoiding tobacco and drug use. Limiting alcohol use. Practicing safe sex. Taking low-dose aspirin every day. Taking vitamin and mineral supplements as recommended by your health care provider. What happens during an annual well check? The services and screenings done by your health care provider during your annual well check will depend on your age, overall health, lifestyle risk factors, and family history  of disease. Counseling  Your health care provider may ask you questions about your: Alcohol use. Tobacco use. Drug use. Emotional well-being. Home and relationship well-being. Sexual activity. Eating habits. History of falls. Memory and ability to understand (cognition). Work and work Astronomer. Reproductive health. Screening  You may have the following tests or measurements: Height, weight, and BMI. Blood pressure. Lipid and cholesterol levels. These may be checked every 5 years, or more frequently if you are over 32 years old. Skin check. Lung cancer screening. You may have this screening every year starting at age 38 if you have a 30-pack-year history of smoking and currently smoke or have quit within the past 15 years. Fecal occult blood test (FOBT) of the stool. You may have this test every year starting at age 40. Flexible sigmoidoscopy or colonoscopy. You may have a sigmoidoscopy every 5 years or a colonoscopy every 10 years starting at age 44. Hepatitis C blood test. Hepatitis B blood test. Sexually transmitted disease (STD) testing. Diabetes screening. This is done by checking your blood sugar (glucose) after you have not eaten for a while (fasting). You may have this done every 1-3 years. Bone density scan. This is done to screen for osteoporosis. You may have this done starting at age 30. Mammogram. This may be done every 1-2 years. Talk to your health care provider about how often you should have regular mammograms. Talk with your health care provider about your test results, treatment options, and if necessary, the need for more tests. Vaccines  Your health care provider may recommend certain vaccines, such as: Influenza vaccine. This is recommended every year. Tetanus, diphtheria, and acellular pertussis (Tdap, Td) vaccine. You may need a Td booster every 10 years. Zoster vaccine. You may need this after age 60. Pneumococcal 13-valent conjugate (PCV13)  vaccine. One  dose is recommended after age 34. Pneumococcal polysaccharide (PPSV23) vaccine. One dose is recommended after age 43. Talk to your health care provider about which screenings and vaccines you need and how often you need them. This information is not intended to replace advice given to you by your health care provider. Make sure you discuss any questions you have with your health care provider. Document Released: 05/05/2015 Document Revised: 12/27/2015 Document Reviewed: 02/07/2015 Elsevier Interactive Patient Education  2017 [place] Prevention in the Home Falls can cause injuries. They can happen to people of all ages. There are many things you can do to make your home safe and to help prevent falls. What can I do on the outside of my home? Regularly fix the edges of walkways and driveways and fix any cracks. Remove anything that might make you trip as you walk through a door, such as a raised step or threshold. Trim any bushes or trees on the path to your home. Use bright outdoor lighting. Clear any walking paths of anything that might make someone trip, such as rocks or tools. Regularly check to see if handrails are loose or broken. Make sure that both sides of any steps have handrails. Any raised decks and porches should have guardrails on the edges. Have any leaves, snow, or ice cleared regularly. Use sand or salt on walking paths during winter. Clean up any spills in your garage right away. This includes oil or grease spills. What can I do in the bathroom? Use night lights. Install grab bars by the toilet and in the tub and shower. Do not use towel bars as grab bars. Use non-skid mats or decals in the tub or shower. If you need to sit down in the shower, use a plastic, non-slip stool. Keep the floor dry. Clean up any water that spills on the floor as soon as it happens. Remove soap buildup in the tub or shower regularly. Attach bath mats securely with double-sided  non-slip rug tape. Do not have throw rugs and other things on the floor that can make you trip. What can I do in the bedroom? Use night lights. Make sure that you have a light by your bed that is easy to reach. Do not use any sheets or blankets that are too big for your bed. They should not hang down onto the floor. Have a firm chair that has side arms. You can use this for support while you get dressed. Do not have throw rugs and other things on the floor that can make you trip. What can I do in the kitchen? Clean up any spills right away. Avoid walking on wet floors. Keep items that you use a lot in easy-to-reach places. If you need to reach something above you, use a strong step stool that has a grab bar. Keep electrical cords out of the way. Do not use floor polish or wax that makes floors slippery. If you must use wax, use non-skid floor wax. Do not have throw rugs and other things on the floor that can make you trip. What can I do with my stairs? Do not leave any items on the stairs. Make sure that there are handrails on both sides of the stairs and use them. Fix handrails that are broken or loose. Make sure that handrails are as long as the stairways. Check any carpeting to make sure that it is firmly attached to the stairs. Fix any carpet that is loose  or worn. Avoid having throw rugs at the top or bottom of the stairs. If you do have throw rugs, attach them to the floor with carpet tape. Make sure that you have a light switch at the top of the stairs and the bottom of the stairs. If you do not have them, ask someone to add them for you. What else can I do to help prevent falls? Wear shoes that: Do not have high heels. Have rubber bottoms. Are comfortable and fit you well. Are closed at the toe. Do not wear sandals. If you use a stepladder: Make sure that it is fully opened. Do not climb a closed stepladder. Make sure that both sides of the stepladder are locked into place. Ask  someone to hold it for you, if possible. Clearly mark and make sure that you can see: Any grab bars or handrails. First and last steps. Where the edge of each step is. Use tools that help you move around (mobility aids) if they are needed. These include: Canes. Walkers. Scooters. Crutches. Turn on the lights when you go into a dark area. Replace any light bulbs as soon as they burn out. Set up your furniture so you have a clear path. Avoid moving your furniture around. If any of your floors are uneven, fix them. If there are any pets around you, be aware of where they are. Review your medicines with your doctor. Some medicines can make you feel dizzy. This can increase your chance of falling. Ask your doctor what other things that you can do to help prevent falls. This information is not intended to replace advice given to you by your health care provider. Make sure you discuss any questions you have with your health care provider. Document Released: 02/02/2009 Document Revised: 09/14/2015 Document Reviewed: 05/13/2014 Elsevier Interactive Patient Education  2017 [employer].

## 2021-11-08 DIAGNOSIS — R2689 Other abnormalities of gait and mobility: Secondary | ICD-10-CM | POA: Diagnosis not present

## 2021-11-08 DIAGNOSIS — M25512 Pain in left shoulder: Secondary | ICD-10-CM | POA: Diagnosis not present

## 2021-11-08 DIAGNOSIS — R6889 Other general symptoms and signs: Secondary | ICD-10-CM | POA: Diagnosis not present

## 2021-11-08 DIAGNOSIS — M542 Cervicalgia: Secondary | ICD-10-CM | POA: Diagnosis not present

## 2021-11-08 DIAGNOSIS — R293 Abnormal posture: Secondary | ICD-10-CM | POA: Diagnosis not present

## 2021-11-08 DIAGNOSIS — M7502 Adhesive capsulitis of left shoulder: Secondary | ICD-10-CM | POA: Diagnosis not present

## 2021-11-08 DIAGNOSIS — G8929 Other chronic pain: Secondary | ICD-10-CM | POA: Diagnosis not present

## 2021-11-15 DIAGNOSIS — R2689 Other abnormalities of gait and mobility: Secondary | ICD-10-CM | POA: Diagnosis not present

## 2021-11-15 DIAGNOSIS — G8929 Other chronic pain: Secondary | ICD-10-CM | POA: Diagnosis not present

## 2021-11-15 DIAGNOSIS — R293 Abnormal posture: Secondary | ICD-10-CM | POA: Diagnosis not present

## 2021-11-15 DIAGNOSIS — R6889 Other general symptoms and signs: Secondary | ICD-10-CM | POA: Diagnosis not present

## 2021-11-15 DIAGNOSIS — M7502 Adhesive capsulitis of left shoulder: Secondary | ICD-10-CM | POA: Diagnosis not present

## 2021-11-15 DIAGNOSIS — M25512 Pain in left shoulder: Secondary | ICD-10-CM | POA: Diagnosis not present

## 2021-11-15 DIAGNOSIS — M542 Cervicalgia: Secondary | ICD-10-CM | POA: Diagnosis not present

## 2021-11-22 ENCOUNTER — Encounter: Payer: Self-pay | Admitting: Internal Medicine

## 2021-11-22 ENCOUNTER — Ambulatory Visit (INDEPENDENT_AMBULATORY_CARE_PROVIDER_SITE_OTHER): Payer: Medicare PPO | Admitting: Internal Medicine

## 2021-11-22 VITALS — BP 150/98 | HR 66 | Temp 97.8°F | Ht 63.2 in | Wt 164.6 lb

## 2021-11-22 DIAGNOSIS — E7800 Pure hypercholesterolemia, unspecified: Secondary | ICD-10-CM | POA: Diagnosis not present

## 2021-11-22 DIAGNOSIS — Z6828 Body mass index (BMI) 28.0-28.9, adult: Secondary | ICD-10-CM | POA: Diagnosis not present

## 2021-11-22 DIAGNOSIS — N182 Chronic kidney disease, stage 2 (mild): Secondary | ICD-10-CM | POA: Diagnosis not present

## 2021-11-22 DIAGNOSIS — M7502 Adhesive capsulitis of left shoulder: Secondary | ICD-10-CM | POA: Diagnosis not present

## 2021-11-22 DIAGNOSIS — M25512 Pain in left shoulder: Secondary | ICD-10-CM | POA: Diagnosis not present

## 2021-11-22 DIAGNOSIS — N952 Postmenopausal atrophic vaginitis: Secondary | ICD-10-CM

## 2021-11-22 DIAGNOSIS — R293 Abnormal posture: Secondary | ICD-10-CM | POA: Diagnosis not present

## 2021-11-22 DIAGNOSIS — R7309 Other abnormal glucose: Secondary | ICD-10-CM

## 2021-11-22 DIAGNOSIS — Z23 Encounter for immunization: Secondary | ICD-10-CM | POA: Diagnosis not present

## 2021-11-22 DIAGNOSIS — I129 Hypertensive chronic kidney disease with stage 1 through stage 4 chronic kidney disease, or unspecified chronic kidney disease: Secondary | ICD-10-CM

## 2021-11-22 DIAGNOSIS — M542 Cervicalgia: Secondary | ICD-10-CM | POA: Diagnosis not present

## 2021-11-22 DIAGNOSIS — Z0000 Encounter for general adult medical examination without abnormal findings: Secondary | ICD-10-CM

## 2021-11-22 DIAGNOSIS — R6889 Other general symptoms and signs: Secondary | ICD-10-CM | POA: Diagnosis not present

## 2021-11-22 DIAGNOSIS — R2689 Other abnormalities of gait and mobility: Secondary | ICD-10-CM | POA: Diagnosis not present

## 2021-11-22 DIAGNOSIS — G8929 Other chronic pain: Secondary | ICD-10-CM | POA: Diagnosis not present

## 2021-11-22 LAB — POCT URINALYSIS DIPSTICK
Bilirubin, UA: NEGATIVE
Blood, UA: NEGATIVE
Glucose, UA: NEGATIVE
Ketones, UA: NEGATIVE
Nitrite, UA: NEGATIVE
Protein, UA: NEGATIVE
Spec Grav, UA: 1.01 (ref 1.010–1.025)
Urobilinogen, UA: 0.2 E.U./dL
pH, UA: 7 (ref 5.0–8.0)

## 2021-11-22 MED ORDER — TELMISARTAN 20 MG PO TABS
ORAL_TABLET | ORAL | 2 refills | Status: DC
Start: 2021-11-22 — End: 2022-05-27

## 2021-11-22 MED ORDER — ROSUVASTATIN CALCIUM 10 MG PO TABS: ORAL_TABLET | ORAL | 2 refills | Status: DC

## 2021-11-22 NOTE — Progress Notes (Unsigned)
Rich Brave Llittleton,acting as a Education administrator for Maximino Greenland, MD.,have documented all relevant documentation on the behalf of Maximino Greenland, MD,as directed by  Maximino Greenland, MD while in the presence of Maximino Greenland, MD.   Subjective:     Patient ID: Morgan Norman , female    DOB: January 19, 1956 , 66 y.o.   MRN: [id number]   Chief Complaint  Patient presents with   Annual Exam   Hypertension    HPI  She is here today for a full physical exam. She is no longer followed by GYN. She is s/p hysterectomy. She reports compliance with meds. She denies headaches, chest pain and shortness of breath. She has no specific concerns or complaints at this time.   Hypertension This is a chronic problem. The current episode started more than 1 year ago. The problem has been gradually improving since onset. The problem is controlled. Pertinent negatives include no blurred vision. The current treatment provides moderate improvement. There are no compliance problems.      Past Medical History:  Diagnosis Date   Benign hypertensive renal disease    CKD (chronic kidney disease) stage 2, GFR 60-89 ml/min    Hypertension    Migraines    Overweight    Pure hypercholesterolemia    Vitamin D deficiency      Family History  Problem Relation Age of Onset   Diabetes Mellitus I Mother    Bladder Cancer Mother    Dementia Mother    Alcoholism Father      Current Outpatient Medications:    Cholecalciferol 5000 UNITS TABS, Take 5,000 Units by mouth., Disp: , Rfl:    cyclobenzaprine (FLEXERIL) 5 MG tablet, Take 1 tablet (5 mg total) by mouth daily as needed for muscle spasms., Disp: 30 tablet, Rfl: 0   diazepam (VALIUM) 5 MG tablet, Take 1 tablet (5 mg total) by mouth every 8 (eight) hours as needed for anxiety or muscle spasms., Disp: 20 tablet, Rfl: 0   ELDERBERRY PO, Take 1,000 mg by mouth., Disp: , Rfl:    Ginkgo Biloba (GINKOBA PO), Take by mouth., Disp: , Rfl:    loratadine (CLARITIN)  10 MG tablet, Take 10 mg by mouth daily as needed for allergies., Disp: , Rfl:    meclizine (ANTIVERT) 25 MG tablet, Take 1 tablet (25 mg total) by mouth 3 (three) times daily as needed for dizziness., Disp: 30 tablet, Rfl: 0   Multiple Vitamins-Minerals (IMMUNE SUPPORT PO), Take 1,000 mg by mouth daily at 6 (six) AM., Disp: , Rfl:    nystatin cream (MYCOSTATIN), Apply topically 3 (three) times daily., Disp: 30 g, Rfl: 1   Omega-3 Fatty Acids (FISH OIL PO), Take 120 mg by mouth., Disp: , Rfl:    triamcinolone cream (KENALOG) 0.1 %, Apply 1 application. topically 2 (two) times daily., Disp: 30 g, Rfl: 1   rosuvastatin (CRESTOR) 10 MG tablet, TAKE 1 TABLET(10 MG) BY MOUTH DAILY, Disp: 90 tablet, Rfl: 2   telmisartan (MICARDIS) 20 MG tablet, TAKE 1 TABLET(20 MG) BY MOUTH DAILY, Disp: 90 tablet, Rfl: 2   No Known Allergies    The patient states she uses post menopausal status for birth control. Last LMP was No LMP recorded. Patient has had a hysterectomy.. Negative for Dysmenorrhea. Negative for: breast discharge, breast lump(s), breast pain and breast self exam. Associated symptoms include abnormal vaginal bleeding. Pertinent negatives include abnormal bleeding (hematology), anxiety, decreased libido, depression, difficulty falling sleep, dyspareunia, history of infertility, nocturia,  sexual dysfunction, sleep disturbances, urinary incontinence, urinary urgency, vaginal discharge and vaginal itching. Diet regular.The patient states her exercise level is  moderate.  . The patient's tobacco use is:  Social History   Tobacco Use  Smoking Status Never  Smokeless Tobacco Never  . She has been exposed to passive smoke. The patient's alcohol use is:  Social History   Substance and Sexual Activity  Alcohol Use Not Currently   Alcohol/week: 0.0 standard drinks of alcohol   Comment: rarely   Review of Systems  Constitutional: Negative.   HENT: Negative.    Eyes: Negative.  Negative for blurred  vision.  Respiratory: Negative.    Cardiovascular: Negative.   Gastrointestinal: Negative.   Endocrine: Negative.   Genitourinary: Negative.        She c/o vaginal dryness. Sx exacerbated by intercourse.   Musculoskeletal: Negative.   Skin: Negative.   Allergic/Immunologic: Negative.   Neurological: Negative.   Hematological: Negative.   Psychiatric/Behavioral: Negative.       Today's Vitals   11/22/21 1412 11/22/21 1507  BP: (!) 142/90 (!) 150/98  Pulse: 66   Temp: 97.8 F (36.6 C)   Weight: 164 lb 9.6 oz (74.7 kg)   Height: 5' 3.2" (1.605 m)   PainSc: 0-No pain    Body mass index is 28.97 kg/m.  Wt Readings from Last 3 Encounters:  11/22/21 164 lb 9.6 oz (74.7 kg)  11/07/21 165 lb 6.4 oz (75 kg)  08/28/21 163 lb 6.4 oz (74.1 kg)     Objective:  Physical Exam Constitutional:      General: She is not in acute distress.    Appearance: Normal appearance. She is well-developed. She is obese.  HENT:     Head: Normocephalic and atraumatic.     Right Ear: Hearing, tympanic membrane, ear canal and external ear normal. There is no impacted cerumen.     Left Ear: Hearing, tympanic membrane, ear canal and external ear normal. There is no impacted cerumen.     Nose:     Comments: Deferred - masked    Mouth/Throat:     Comments: Deferred - masked Eyes:     General: Lids are normal.     Extraocular Movements: Extraocular movements intact.     Conjunctiva/sclera: Conjunctivae normal.     Pupils: Pupils are equal, round, and reactive to light.     Funduscopic exam:    Right eye: No papilledema.        Left eye: No papilledema.  Neck:     Thyroid: No thyroid mass.     Vascular: No carotid bruit.  Cardiovascular:     Rate and Rhythm: Normal rate and regular rhythm.     Pulses: Normal pulses.     Heart sounds: Normal heart sounds. No murmur heard. Pulmonary:     Effort: Pulmonary effort is normal.     Breath sounds: Normal breath sounds.  Chest:     Chest wall: No mass.   Breasts:    Tanner Score is 5.     Right: Normal. No mass or tenderness.     Left: Normal. No mass or tenderness.  Abdominal:     General: Abdomen is flat. Bowel sounds are normal. There is no distension.     Palpations: Abdomen is soft.     Tenderness: There is no abdominal tenderness.  Genitourinary:    Rectum: Guaiac result negative.  Musculoskeletal:        General: No swelling. Normal range of motion.  Cervical back: Full passive range of motion without pain, normal range of motion and neck supple.     Right lower leg: No edema.     Left lower leg: No edema.  Lymphadenopathy:     Upper Body:     Right upper body: No supraclavicular, axillary or pectoral adenopathy.     Left upper body: No supraclavicular, axillary or pectoral adenopathy.  Skin:    General: Skin is warm and dry.     Capillary Refill: Capillary refill takes less than 2 seconds.  Neurological:     General: No focal deficit present.     Mental Status: She is alert and oriented to person, place, and time.     Cranial Nerves: No cranial nerve deficit.     Sensory: No sensory deficit.  Psychiatric:        Mood and Affect: Mood normal.        Behavior: Behavior normal.        Thought Content: Thought content normal.        Judgment: Judgment normal.         Assessment And Plan:     1. Encounter for general adult medical examination w/o abnormal findings Comments: A full exam was performed. Importance of monthly self breast exams was discussed with the patient. PATIENT IS ADVISED TO GET 30-45 MINUTES REGULAR EXERCISE NO LESS THAN FOUR TO FIVE DAYS PER WEEK - BOTH WEIGHTBEARING EXERCISES AND AEROBIC ARE RECOMMENDED.  PATIENT IS ADVISED TO FOLLOW A HEALTHY DIET WITH AT LEAST SIX FRUITS/VEGGIES PER DAY, DECREASE INTAKE OF RED MEAT, AND TO INCREASE FISH INTAKE TO TWO DAYS PER WEEK.  MEATS/FISH SHOULD NOT BE FRIED, BAKED OR BROILED IS PREFERABLE.  IT IS ALSO IMPORTANT TO CUT BACK ON YOUR SUGAR INTAKE. PLEASE AVOID  ANYTHING WITH ADDED SUGAR, CORN SYRUP OR OTHER SWEETENERS. IF YOU MUST USE A SWEETENER, YOU CAN TRY STEVIA. IT IS ALSO IMPORTANT TO AVOID ARTIFICIALLY SWEETENERS AND DIET BEVERAGES. LASTLY, I SUGGEST WEARING SPF 50 SUNSCREEN ON EXPOSED PARTS AND ESPECIALLY WHEN IN THE DIRECT SUNLIGHT FOR AN EXTENDED PERIOD OF TIME.  PLEASE AVOID FAST FOOD RESTAURANTS AND INCREASE YOUR WATER INTAKE.  2. Hypertensive nephropathy Comments: Chronic, uncontrolled. She admits she hasn't taken her meds yet. EKG performed, NSR w/o acute changes.  She will c/w telmisartan 45m daily.   She is encouraged to follow a low sodium diet. She will rto in 6 months for re-evaluation.  - POCT Urinalysis Dipstick (81002) - Microalbumin / Creatinine Urine Ratio - EKG 12-Lead - CBC - CMP14+EGFR  3. Chronic renal disease, stage II Comments: She is encouraged to keep BP well controlled, stay well hydrated and avoid NSAIDs to decrease risk of CKD progression.   4. Pure hypercholesterolemia Comments: Chronic, she will c/w rosuvastatin 163mdaily. She is encouraged to limit fried foods, increase exercise and increase fish/fiber intake.   5. Vaginitis, atrophic Comments: She has tried vitamin E suppositories. She may benefit from vaginal estrogen vs. Intrarosaa vaginal inserts. Will discuss further options with GYN.   6. Other abnormal glucose Comments: Her a1c has been elevated in the past. I will recheck this today. She is encouraged to limit her intake of sugary beverages and foods.  - Hemoglobin A1c  7. Immunization due - Pneumococcal conjugate vaccine 20-valent (Prevnar-20)  8. BMI 28.0-28.9,adult Comments: Her BMI is acceptable for her demographic. She is encouraged to aim for at least 150 minutes of exercise per week.  Patient was given opportunity to ask questions.  Patient verbalized understanding of the plan and was able to repeat key elements of the plan. All questions were answered to their satisfaction.   I, Maximino Greenland, MD, have reviewed all documentation for this visit. The documentation on 11/22/21 for the exam, diagnosis, procedures, and orders are all accurate and complete.   THE PATIENT IS ENCOURAGED TO PRACTICE SOCIAL DISTANCING DUE TO THE COVID-19 PANDEMIC.

## 2021-11-22 NOTE — Patient Instructions (Addendum)
Magnesium glycinate nightly  Topical magnesium oil spray  Health Maintenance, Female Adopting a healthy lifestyle and getting preventive care are important in promoting health and wellness. Ask your health care provider about: The right schedule for you to have regular tests and exams. Things you can do on your own to prevent diseases and keep yourself healthy. What should I know about diet, weight, and exercise? Eat a healthy diet  Eat a diet that includes plenty of vegetables, fruits, low-fat dairy products, and lean protein. Do not eat a lot of foods that are high in solid fats, added sugars, or sodium. Maintain a healthy weight Body mass index (BMI) is used to identify weight problems. It estimates body fat based on height and weight. Your health care provider can help determine your BMI and help you achieve or maintain a healthy weight. Get regular exercise Get regular exercise. This is one of the most important things you can do for your health. Most adults should: Exercise for at least 150 minutes each week. The exercise should increase your heart rate and make you sweat (moderate-intensity exercise). Do strengthening exercises at least twice a week. This is in addition to the moderate-intensity exercise. Spend less time sitting. Even light physical activity can be beneficial. Watch cholesterol and blood lipids Have your blood tested for lipids and cholesterol at 68 years of age, then have this test every 5 years. Have your cholesterol levels checked more often if: Your lipid or cholesterol levels are high. You are older than 68 years of age. You are at high risk for heart disease. What should I know about cancer screening? Depending on your health history and family history, you may need to have cancer screening at various ages. This may include screening for: Breast cancer. Cervical cancer. Colorectal cancer. Skin cancer. Lung cancer. What should I know about heart disease,  diabetes, and high blood pressure? Blood pressure and heart disease High blood pressure causes heart disease and increases the risk of stroke. This is more likely to develop in people who have high blood pressure readings or are overweight. Have your blood pressure checked: Every 3-5 years if you are 67-4 years of age. Every year if you are 56 years old or older. Diabetes Have regular diabetes screenings. This checks your fasting blood sugar level. Have the screening done: Once every three years after age 14 if you are at a normal weight and have a low risk for diabetes. More often and at a younger age if you are overweight or have a high risk for diabetes. What should I know about preventing infection? Hepatitis B If you have a higher risk for hepatitis B, you should be screened for this virus. Talk with your health care provider to find out if you are at risk for hepatitis B infection. Hepatitis C Testing is recommended for: Everyone born from [birth year] through 1967. Anyone with known risk factors for hepatitis C. Sexually transmitted infections (STIs) Get screened for STIs, including gonorrhea and chlamydia, if: You are sexually active and are younger than 68 years of age. You are older than 68 years of age and your health care provider tells you that you are at risk for this type of infection. Your sexual activity has changed since you were last screened, and you are at increased risk for chlamydia or gonorrhea. Ask your health care provider if you are at risk. Ask your health care provider about whether you are at high risk for HIV. Your health care provider  may recommend a prescription medicine to help prevent HIV infection. If you choose to take medicine to prevent HIV, you should first get tested for HIV. You should then be tested every 3 months for as long as you are taking the medicine. Pregnancy If you are about to stop having your period (premenopausal) and you may become pregnant,  seek counseling before you get pregnant. Take 400 to 800 micrograms (mcg) of folic acid every day if you become pregnant. Ask for birth control (contraception) if you want to prevent pregnancy. Osteoporosis and menopause Osteoporosis is a disease in which the bones lose minerals and strength with aging. This can result in bone fractures. If you are 26 years old or older, or if you are at risk for osteoporosis and fractures, ask your health care provider if you should: Be screened for bone loss. Take a calcium or vitamin D supplement to lower your risk of fractures. Be given hormone replacement therapy (HRT) to treat symptoms of menopause. Follow these instructions at home: Alcohol use Do not drink alcohol if: Your health care provider tells you not to drink. You are pregnant, may be pregnant, or are planning to become pregnant. If you drink alcohol: Limit how much you have to: 0-1 drink a day. Know how much alcohol is in your drink. In the [place] one drink equals one 12 oz bottle of beer (355 mL), one 5 oz glass of wine (148 mL), or one 1 oz glass of hard liquor (44 mL). Lifestyle Do not use any products that contain nicotine or tobacco. These products include cigarettes, chewing tobacco, and vaping devices, such as e-cigarettes. If you need help quitting, ask your health care provider. Do not use street drugs. Do not share needles. Ask your health care provider for help if you need support or information about quitting drugs. General instructions Schedule regular health, dental, and eye exams. Stay current with your vaccines. Tell your health care provider if: You often feel depressed. You have ever been abused or do not feel safe at home. Summary Adopting a healthy lifestyle and getting preventive care are important in promoting health and wellness. Follow your health care provider's instructions about healthy diet, exercising, and getting tested or screened for diseases. Follow your  health care provider's instructions on monitoring your cholesterol and blood pressure. This information is not intended to replace advice given to you by your health care provider. Make sure you discuss any questions you have with your health care provider. Document Revised: 08/28/2020 Document Reviewed: 08/28/2020 Elsevier Patient Education  [employer].

## 2021-11-23 LAB — CBC
Hematocrit: 39.7 % (ref 34.0–46.6)
Hemoglobin: 13.3 g/dL (ref 11.1–15.9)
MCH: 29.8 pg (ref 26.6–33.0)
MCHC: 33.5 g/dL (ref 31.5–35.7)
MCV: 89 fL (ref 79–97)
Platelets: 214 10*3/uL (ref 150–450)
RBC: 4.47 x10E6/uL (ref 3.77–5.28)
RDW: 12.5 % (ref 11.7–15.4)
WBC: 4.2 10*3/uL (ref 3.4–10.8)

## 2021-11-23 LAB — HEMOGLOBIN A1C
Est. average glucose Bld gHb Est-mCnc: 117 mg/dL
Hgb A1c MFr Bld: 5.7 % — ABNORMAL HIGH (ref 4.8–5.6)

## 2021-11-23 LAB — CMP14+EGFR
ALT: 13 IU/L (ref 0–32)
AST: 27 IU/L (ref 0–40)
Albumin/Globulin Ratio: 1.8 (ref 1.2–2.2)
Albumin: 4.6 g/dL (ref 3.9–4.9)
Alkaline Phosphatase: 77 IU/L (ref 44–121)
BUN/Creatinine Ratio: 12 (ref 12–28)
BUN: 9 mg/dL (ref 8–27)
Bilirubin Total: 0.3 mg/dL (ref 0.0–1.2)
CO2: 25 mmol/L (ref 20–29)
Calcium: 9.5 mg/dL (ref 8.7–10.3)
Chloride: 101 mmol/L (ref 96–106)
Creatinine, Ser: 0.76 mg/dL (ref 0.57–1.00)
Globulin, Total: 2.6 g/dL (ref 1.5–4.5)
Glucose: 80 mg/dL (ref 70–99)
Potassium: 4.6 mmol/L (ref 3.5–5.2)
Sodium: 142 mmol/L (ref 134–144)
Total Protein: 7.2 g/dL (ref 6.0–8.5)
eGFR: 85 mL/min/{1.73_m2} (ref >59)

## 2021-11-23 LAB — MICROALBUMIN / CREATININE URINE RATIO
Creatinine, Urine: 18.7 mg/dL
Microalb/Creat Ratio: <16 mg/g creat (ref 0–29)
Microalbumin, Urine: <3 ug/mL

## 2021-12-04 ENCOUNTER — Encounter: Payer: Self-pay | Admitting: Internal Medicine

## 2021-12-06 DIAGNOSIS — M7502 Adhesive capsulitis of left shoulder: Secondary | ICD-10-CM | POA: Diagnosis not present

## 2021-12-06 DIAGNOSIS — M542 Cervicalgia: Secondary | ICD-10-CM | POA: Diagnosis not present

## 2021-12-06 DIAGNOSIS — R2689 Other abnormalities of gait and mobility: Secondary | ICD-10-CM | POA: Diagnosis not present

## 2021-12-06 DIAGNOSIS — R293 Abnormal posture: Secondary | ICD-10-CM | POA: Diagnosis not present

## 2021-12-06 DIAGNOSIS — M25512 Pain in left shoulder: Secondary | ICD-10-CM | POA: Diagnosis not present

## 2021-12-06 DIAGNOSIS — R6889 Other general symptoms and signs: Secondary | ICD-10-CM | POA: Diagnosis not present

## 2021-12-06 DIAGNOSIS — G8929 Other chronic pain: Secondary | ICD-10-CM | POA: Diagnosis not present

## 2021-12-09 ENCOUNTER — Encounter: Payer: Self-pay | Admitting: Internal Medicine

## 2021-12-10 ENCOUNTER — Other Ambulatory Visit: Payer: Self-pay | Admitting: Internal Medicine

## 2021-12-10 MED ORDER — INTRAROSA 6.5 MG VA INST: VAGINAL_INSERT | VAGINAL | 1 refills | Status: DC

## 2021-12-13 DIAGNOSIS — M7502 Adhesive capsulitis of left shoulder: Secondary | ICD-10-CM | POA: Diagnosis not present

## 2021-12-13 DIAGNOSIS — R2689 Other abnormalities of gait and mobility: Secondary | ICD-10-CM | POA: Diagnosis not present

## 2021-12-13 DIAGNOSIS — R6889 Other general symptoms and signs: Secondary | ICD-10-CM | POA: Diagnosis not present

## 2021-12-13 DIAGNOSIS — R293 Abnormal posture: Secondary | ICD-10-CM | POA: Diagnosis not present

## 2021-12-13 DIAGNOSIS — M25512 Pain in left shoulder: Secondary | ICD-10-CM | POA: Diagnosis not present

## 2021-12-13 DIAGNOSIS — G8929 Other chronic pain: Secondary | ICD-10-CM | POA: Diagnosis not present

## 2021-12-13 DIAGNOSIS — M542 Cervicalgia: Secondary | ICD-10-CM | POA: Diagnosis not present

## 2022-05-17 DIAGNOSIS — Z1231 Encounter for screening mammogram for malignant neoplasm of breast: Secondary | ICD-10-CM | POA: Diagnosis not present

## 2022-05-17 LAB — HM MAMMOGRAPHY

## 2022-05-27 ENCOUNTER — Encounter: Payer: Self-pay | Admitting: Internal Medicine

## 2022-05-27 ENCOUNTER — Ambulatory Visit (INDEPENDENT_AMBULATORY_CARE_PROVIDER_SITE_OTHER): Payer: Medicare PPO | Admitting: Internal Medicine

## 2022-05-27 VITALS — BP 110/78 | HR 88 | Temp 97.6°F | Ht 63.0 in | Wt 168.2 lb

## 2022-05-27 DIAGNOSIS — N952 Postmenopausal atrophic vaginitis: Secondary | ICD-10-CM | POA: Diagnosis not present

## 2022-05-27 DIAGNOSIS — E7800 Pure hypercholesterolemia, unspecified: Secondary | ICD-10-CM

## 2022-05-27 DIAGNOSIS — M545 Low back pain, unspecified: Secondary | ICD-10-CM | POA: Diagnosis not present

## 2022-05-27 DIAGNOSIS — R7309 Other abnormal glucose: Secondary | ICD-10-CM | POA: Diagnosis not present

## 2022-05-27 DIAGNOSIS — E663 Overweight: Secondary | ICD-10-CM | POA: Insufficient documentation

## 2022-05-27 DIAGNOSIS — Z6829 Body mass index (BMI) 29.0-29.9, adult: Secondary | ICD-10-CM

## 2022-05-27 DIAGNOSIS — G8929 Other chronic pain: Secondary | ICD-10-CM

## 2022-05-27 DIAGNOSIS — I129 Hypertensive chronic kidney disease with stage 1 through stage 4 chronic kidney disease, or unspecified chronic kidney disease: Secondary | ICD-10-CM | POA: Diagnosis not present

## 2022-05-27 DIAGNOSIS — N182 Chronic kidney disease, stage 2 (mild): Secondary | ICD-10-CM

## 2022-05-27 MED ORDER — ESTRADIOL 10 MCG VA TABS: ORAL_TABLET | VAGINAL | 0 refills | Status: DC

## 2022-05-27 MED ORDER — NYSTATIN 100000 UNIT/GM EX CREA: TOPICAL_CREAM | Freq: Three times a day (TID) | CUTANEOUS | 1 refills | Status: AC

## 2022-05-27 MED ORDER — CYCLOBENZAPRINE HCL 5 MG PO TABS: 5.0000 mg | ORAL_TABLET | Freq: Every day | ORAL | 0 refills | Status: DC | PRN

## 2022-05-27 MED ORDER — TELMISARTAN 20 MG PO TABS: ORAL_TABLET | ORAL | 2 refills | Status: DC

## 2022-05-27 NOTE — Patient Instructions (Signed)
Hypertension, Adult Hypertension is another name for high blood pressure. High blood pressure forces your heart to work harder to pump blood. This can cause problems over time. There are two numbers in a blood pressure reading. There is a top number (systolic) over a bottom number (diastolic). It is best to have a blood pressure that is below 120/80. What are the causes? The cause of this condition is not known. Some other conditions can lead to high blood pressure. What increases the risk? Some lifestyle factors can make you more likely to develop high blood pressure: Smoking. Not getting enough exercise or physical activity. Being overweight. Having too much fat, sugar, calories, or salt (sodium) in your diet. Drinking too much alcohol. Other risk factors include: Having any of these conditions: Heart disease. Diabetes. High cholesterol. Kidney disease. Obstructive sleep apnea. Having a family history of high blood pressure and high cholesterol. Age. The risk increases with age. Stress. What are the signs or symptoms? High blood pressure may not cause symptoms. Very high blood pressure (hypertensive crisis) may cause: Headache. Fast or uneven heartbeats (palpitations). Shortness of breath. Nosebleed. Vomiting or feeling like you may vomit (nauseous). Changes in how you see. Very bad chest pain. Feeling dizzy. Seizures. How is this treated? This condition is treated by making healthy lifestyle changes, such as: Eating healthy foods. Exercising more. Drinking less alcohol. Your doctor may prescribe medicine if lifestyle changes do not help enough and if: Your top number is above 130. Your bottom number is above 80. Your personal target blood pressure may vary. Follow these instructions at home: Eating and drinking  If told, follow the DASH eating plan. To follow this plan: Fill one half of your plate at each meal with fruits and vegetables. Fill one fourth of your plate  at each meal with whole grains. Whole grains include whole-wheat pasta, brown rice, and whole-grain bread. Eat or drink low-fat dairy products, such as skim milk or low-fat yogurt. Fill one fourth of your plate at each meal with low-fat (lean) proteins. Low-fat proteins include fish, chicken without skin, eggs, beans, and tofu. Avoid fatty meat, cured and processed meat, or chicken with skin. Avoid pre-made or processed food. Limit the amount of salt in your diet to less than 1,500 mg each day. Do not drink alcohol if: Your doctor tells you not to drink. You are pregnant, may be pregnant, or are planning to become pregnant. If you drink alcohol: Limit how much you have to: 0-1 drink a day for women. 0-2 drinks a day for men. Know how much alcohol is in your drink. In the [place] one drink equals one 12 oz bottle of beer (355 mL), one 5 oz glass of wine (148 mL), or one 1 oz glass of hard liquor (44 mL). Lifestyle  Work with your doctor to stay at a healthy weight or to lose weight. Ask your doctor what the best weight is for you. Get at least 30 minutes of exercise that causes your heart to beat faster (aerobic exercise) most days of the week. This may include walking, swimming, or biking. Get at least 30 minutes of exercise that strengthens your muscles (resistance exercise) at least 3 days a week. This may include lifting weights or doing Pilates. Do not smoke or use any products that contain nicotine or tobacco. If you need help quitting, ask your doctor. Check your blood pressure at home as told by your doctor. Keep all follow-up visits. Medicines Take over-the-counter and prescription medicines  only as told by your doctor. Follow directions carefully. ?Do not skip doses of blood pressure medicine. The medicine does not work as well if you skip doses. Skipping doses also puts you at risk for problems. ?Ask your doctor about side effects or reactions to medicines that you should watch  for. ?Contact a doctor if: ?You think you are having a reaction to the medicine you are taking. ?You have headaches that keep coming back. ?You feel dizzy. ?You have swelling in your ankles. ?You have trouble with your vision. ?Get help right away if: ?You get a very bad headache. ?You start to feel mixed up (confused). ?You feel weak or numb. ?You feel faint. ?You have very bad pain in your: ?Chest. ?Belly (abdomen). ?You vomit more than once. ?You have trouble breathing. ?These symptoms may be an emergency. Get help right away. Call 911. ?Do not wait to see if the symptoms will go away. ?Do not drive yourself to the hospital. ?Summary ?Hypertension is another name for high blood pressure. ?High blood pressure forces your heart to work harder to pump blood. ?For most people, a normal blood pressure is less than 120/80. ?Making healthy choices can help lower blood pressure. If your blood pressure does not get lower with healthy choices, you may need to take medicine. ?This information is not intended to replace advice given to you by your health care provider. Make sure you discuss any questions you have with your health care provider. ?Document Revised: 01/25/2021 Document Reviewed: 01/25/2021 ?Elsevier Patient Education ? 2023 [employer] ? ?

## 2022-05-27 NOTE — Progress Notes (Signed)
I,Victoria T Hamilton,acting as a scribe for Maximino Greenland, MD.,have documented all relevant documentation on the behalf of Maximino Greenland, MD,as directed by  Maximino Greenland, MD while in the presence of Maximino Greenland, MD.    Subjective:     Patient ID: Morgan Norman , female    DOB: [birth year] , 67 y.o.   MRN: [id number]   Chief Complaint  Patient presents with   Hypertension    HPI  She is here today for bp check/chol check. She reports compliance with meds. She denies headaches, chest pain and shortness of breath. She states she has been exercising regularly. Plans to go once she leaves here and eats breakfast. Denies having any new concerns.      Hypertension This is a chronic problem. The current episode started more than 1 year ago. The problem has been gradually improving since onset. The problem is controlled. Pertinent negatives include no blurred vision, chest pain, palpitations or shortness of breath. The current treatment provides moderate improvement.     Past Medical History:  Diagnosis Date   Benign hypertensive renal disease    CKD (chronic kidney disease) stage 2, GFR 60-89 ml/min    Hypertension    Migraines    Overweight    Pure hypercholesterolemia    Vitamin D deficiency      Family History  Problem Relation Age of Onset   Diabetes Mellitus I Mother    Bladder Cancer Mother    Dementia Mother    Alcoholism Father      Current Outpatient Medications:    Cholecalciferol 5000 UNITS TABS, Take 5,000 Units by mouth., Disp: , Rfl:    ELDERBERRY PO, Take 1,000 mg by mouth., Disp: , Rfl:    Estradiol (VAGIFEM) 10 MCG TABS vaginal tablet, Insert vaginally twice weekly, Disp: 8 tablet, Rfl: 0   Ginkgo Biloba (GINKOBA PO), Take by mouth., Disp: , Rfl:    loratadine (CLARITIN) 10 MG tablet, Take 10 mg by mouth daily as needed for allergies., Disp: , Rfl:    meclizine (ANTIVERT) 25 MG tablet, Take 1 tablet (25 mg total) by mouth 3 (three) times  daily as needed for dizziness., Disp: 30 tablet, Rfl: 0   Multiple Vitamins-Minerals (IMMUNE SUPPORT PO), Take 1,000 mg by mouth daily at 6 (six) AM., Disp: , Rfl:    Omega-3 Fatty Acids (FISH OIL PO), Take 120 mg by mouth., Disp: , Rfl:    rosuvastatin (CRESTOR) 10 MG tablet, TAKE 1 TABLET(10 MG) BY MOUTH DAILY, Disp: 90 tablet, Rfl: 2   triamcinolone cream (KENALOG) 0.1 %, Apply 1 application. topically 2 (two) times daily., Disp: 30 g, Rfl: 1   cyclobenzaprine (FLEXERIL) 5 MG tablet, Take 1 tablet (5 mg total) by mouth daily as needed for muscle spasms., Disp: 30 tablet, Rfl: 0   nystatin cream (MYCOSTATIN), Apply topically 3 (three) times daily., Disp: 30 g, Rfl: 1   telmisartan (MICARDIS) 20 MG tablet, TAKE 1 TABLET(20 MG) BY MOUTH DAILY, Disp: 90 tablet, Rfl: 2   No Known Allergies   Review of Systems  Constitutional: Negative.   Eyes:  Negative for blurred vision.  Respiratory: Negative.  Negative for shortness of breath.   Cardiovascular: Negative.  Negative for chest pain and palpitations.  Neurological: Negative.   Psychiatric/Behavioral: Negative.       Today's Vitals   05/27/22 0904  BP: 110/78  Pulse: 88  Temp: 97.6 F (36.4 C)  SpO2: 96%  Weight: 168 lb 3.2 oz (  76.3 kg)  Height: 5\' 3"  (1.6 m)   Body mass index is 29.8 kg/m.  Wt Readings from Last 3 Encounters:  05/27/22 168 lb 3.2 oz (76.3 kg)  11/22/21 164 lb 9.6 oz (74.7 kg)  11/07/21 165 lb 6.4 oz (75 kg)    Objective:  Physical Exam Vitals and nursing note reviewed.  Constitutional:      Appearance: Normal appearance.  HENT:     Head: Normocephalic and atraumatic.     Nose:     Comments: Masked     Mouth/Throat:     Comments: Masked  Eyes:     Extraocular Movements: Extraocular movements intact.  Cardiovascular:     Rate and Rhythm: Normal rate and regular rhythm.     Heart sounds: Normal heart sounds.  Pulmonary:     Effort: Pulmonary effort is normal.     Breath sounds: Normal breath sounds.   Musculoskeletal:     Cervical back: Normal range of motion.  Skin:    General: Skin is warm.  Neurological:     General: No focal deficit present.     Mental Status: She is alert.  Psychiatric:        Mood and Affect: Mood normal.        Behavior: Behavior normal.         Assessment And Plan:     1. Hypertensive nephropathy Comments: Chronic, well controlled.  She will c/w telmisartan 20mg  daily. I will check renal function today.  She will f/u in 4-6 months for re-evaluation. - CMP14+EGFR - CBC - Hemoglobin A1c - Lipid panel  2. Chronic renal disease, stage II Comments: Chronic, encouraged to stay well hydrated and keep BP well controlled to decrease risk of CKD progression.  3. Pure hypercholesterolemia Comments: Chronic, currently taking rosuvastatin. Encouraged to follow heart healthy lifestyle. - CMP14+EGFR - CBC - Hemoglobin A1c - Lipid panel  4. Chronic bilateral low back pain without sciatica Comments: She has intermittent sx. I will send refill of cyclobenzaprine to use nightly prn. - cyclobenzaprine (FLEXERIL) 5 MG tablet; Take 1 tablet (5 mg total) by mouth daily as needed for muscle spasms.  Dispense: 30 tablet; Refill: 0  5. Other abnormal glucose Comments: Her a1c has been elevated in the past. I will recheck this today. She is encouraged to aim for at least 150 minutes of exercise/week. - CMP14+EGFR - CBC - Hemoglobin A1c - Lipid panel  6. Atrophic vaginitis Comments: She did not like Intrarosa. I will send rx Vagifem to use twice weekly.  7. Body mass index (BMI) 29.0-29.9, adult Comments: She is encouraged to aim for at least 150 minutes of exercise per week.   Patient was given opportunity to ask questions. Patient verbalized understanding of the plan and was able to repeat key elements of the plan. All questions were answered to their satisfaction.   I, Maximino Greenland, MD, have reviewed all documentation for this visit. The documentation on  05/27/22 for the exam, diagnosis, procedures, and orders are all accurate and complete.   IF YOU HAVE BEEN REFERRED TO A SPECIALIST, IT MAY TAKE 1-2 WEEKS TO SCHEDULE/PROCESS THE REFERRAL. IF YOU HAVE NOT HEARD FROM US/SPECIALIST IN TWO WEEKS, PLEASE GIVE [place] A CALL AT [phone] X [phone].   THE PATIENT IS ENCOURAGED TO PRACTICE SOCIAL DISTANCING DUE TO THE COVID-19 PANDEMIC.

## 2022-05-28 LAB — LIPID PANEL
Chol/HDL Ratio: 2.8 ratio (ref 0.0–4.4)
Cholesterol, Total: 179 mg/dL (ref 100–199)
HDL: 65 mg/dL (ref >39)
LDL Chol Calc (NIH): 102 mg/dL — ABNORMAL HIGH (ref 0–99)
Triglycerides: 63 mg/dL (ref 0–149)
VLDL Cholesterol Cal: 12 mg/dL (ref 5–40)

## 2022-05-28 LAB — CMP14+EGFR
ALT: 12 IU/L (ref 0–32)
AST: 22 IU/L (ref 0–40)
Albumin/Globulin Ratio: 1.6 (ref 1.2–2.2)
Albumin: 4.6 g/dL (ref 3.9–4.9)
Alkaline Phosphatase: 83 IU/L (ref 44–121)
BUN/Creatinine Ratio: 12 (ref 12–28)
BUN: 10 mg/dL (ref 8–27)
Bilirubin Total: 0.5 mg/dL (ref 0.0–1.2)
CO2: 25 mmol/L (ref 20–29)
Calcium: 9.4 mg/dL (ref 8.7–10.3)
Chloride: 100 mmol/L (ref 96–106)
Creatinine, Ser: 0.82 mg/dL (ref 0.57–1.00)
Globulin, Total: 2.8 g/dL (ref 1.5–4.5)
Glucose: 86 mg/dL (ref 70–99)
Potassium: 4.1 mmol/L (ref 3.5–5.2)
Sodium: 141 mmol/L (ref 134–144)
Total Protein: 7.4 g/dL (ref 6.0–8.5)
eGFR: 78 mL/min/{1.73_m2} (ref >59)

## 2022-05-28 LAB — CBC
Hematocrit: 42.4 % (ref 34.0–46.6)
Hemoglobin: 13.9 g/dL (ref 11.1–15.9)
MCH: 29.8 pg (ref 26.6–33.0)
MCHC: 32.8 g/dL (ref 31.5–35.7)
MCV: 91 fL (ref 79–97)
Platelets: 122 10*3/uL — ABNORMAL LOW (ref 150–450)
RBC: 4.66 x10E6/uL (ref 3.77–5.28)
RDW: 12.4 % (ref 11.7–15.4)
WBC: 4.2 10*3/uL (ref 3.4–10.8)

## 2022-05-28 LAB — HEMOGLOBIN A1C
Est. average glucose Bld gHb Est-mCnc: 114 mg/dL
Hgb A1c MFr Bld: 5.6 % (ref 4.8–5.6)

## 2022-05-29 ENCOUNTER — Other Ambulatory Visit: Payer: Self-pay

## 2022-05-29 ENCOUNTER — Encounter: Payer: Self-pay | Admitting: Internal Medicine

## 2022-05-29 DIAGNOSIS — D696 Thrombocytopenia, unspecified: Secondary | ICD-10-CM

## 2022-06-05 DIAGNOSIS — R52 Pain, unspecified: Secondary | ICD-10-CM | POA: Diagnosis not present

## 2022-06-05 DIAGNOSIS — J069 Acute upper respiratory infection, unspecified: Secondary | ICD-10-CM | POA: Diagnosis not present

## 2022-06-27 ENCOUNTER — Other Ambulatory Visit: Payer: Self-pay | Admitting: Internal Medicine

## 2022-06-27 DIAGNOSIS — D696 Thrombocytopenia, unspecified: Secondary | ICD-10-CM | POA: Diagnosis not present

## 2022-06-28 LAB — CBC
Hematocrit: 40.1 % (ref 34.0–46.6)
Hemoglobin: 12.9 g/dL (ref 11.1–15.9)
MCH: 29.2 pg (ref 26.6–33.0)
MCHC: 32.2 g/dL (ref 31.5–35.7)
MCV: 91 fL (ref 79–97)
Platelets: 103 10*3/uL — ABNORMAL LOW (ref 150–450)
RBC: 4.42 x10E6/uL (ref 3.77–5.28)
RDW: 12.5 % (ref 11.7–15.4)
WBC: 4.2 10*3/uL (ref 3.4–10.8)

## 2022-07-02 ENCOUNTER — Encounter: Payer: Self-pay | Admitting: Internal Medicine

## 2022-08-05 DIAGNOSIS — X58XXXS Exposure to other specified factors, sequela: Secondary | ICD-10-CM | POA: Diagnosis not present

## 2022-08-05 DIAGNOSIS — M545 Low back pain, unspecified: Secondary | ICD-10-CM | POA: Diagnosis not present

## 2022-08-05 DIAGNOSIS — M4856XS Collapsed vertebra, not elsewhere classified, lumbar region, sequela of fracture: Secondary | ICD-10-CM | POA: Diagnosis not present

## 2022-08-13 ENCOUNTER — Ambulatory Visit (INDEPENDENT_AMBULATORY_CARE_PROVIDER_SITE_OTHER): Payer: Medicare PPO | Admitting: Internal Medicine

## 2022-08-13 ENCOUNTER — Encounter: Payer: Self-pay | Admitting: Internal Medicine

## 2022-08-13 VITALS — BP 136/80 | HR 80 | Temp 98.1°F | Ht 63.0 in | Wt 172.4 lb

## 2022-08-13 DIAGNOSIS — E6609 Other obesity due to excess calories: Secondary | ICD-10-CM | POA: Diagnosis not present

## 2022-08-13 DIAGNOSIS — Z6830 Body mass index (BMI) 30.0-30.9, adult: Secondary | ICD-10-CM

## 2022-08-13 DIAGNOSIS — M4856XS Collapsed vertebra, not elsewhere classified, lumbar region, sequela of fracture: Secondary | ICD-10-CM | POA: Diagnosis not present

## 2022-08-13 DIAGNOSIS — M8588 Other specified disorders of bone density and structure, other site: Secondary | ICD-10-CM | POA: Diagnosis not present

## 2022-08-13 MED ORDER — TRAMADOL HCL 50 MG PO TABS: 50.0000 mg | ORAL_TABLET | Freq: Four times a day (QID) | ORAL | 0 refills | Status: AC | PRN

## 2022-08-13 NOTE — Patient Instructions (Signed)
Acute Back Pain, Adult Acute back pain is sudden and usually short-lived. It is often caused by an injury to the muscles and tissues in the back. The injury may result from: A muscle, tendon, or ligament getting overstretched or torn. Ligaments are tissues that connect bones to each other. Lifting something improperly can cause a back strain. Wear and tear (degeneration) of the spinal disks. Spinal disks are circular tissue that provide cushioning between the bones of the spine (vertebrae). Twisting motions, such as while playing sports or doing yard work. A hit to the back. Arthritis. You may have a physical exam, lab tests, and imaging tests to find the cause of your pain. Acute back pain usually goes away with rest and home care. Follow these instructions at home: Managing pain, stiffness, and swelling Take over-the-counter and prescription medicines only as told by your health care provider. Treatment may include medicines for pain and inflammation that are taken by mouth or applied to the skin, or muscle relaxants. Your health care provider may recommend applying ice during the first 24-48 hours after your pain starts. To do this: Put ice in a plastic bag. Place a towel between your skin and the bag. Leave the ice on for 20 minutes, 2-3 times a day. Remove the ice if your skin turns bright red. This is very important. If you cannot feel pain, heat, or cold, you have a greater risk of damage to the area. If directed, apply heat to the affected area as often as told by your health care provider. Use the heat source that your health care provider recommends, such as a moist heat pack or a heating pad. Place a towel between your skin and the heat source. Leave the heat on for 20-30 minutes. Remove the heat if your skin turns bright red. This is especially important if you are unable to feel pain, heat, or cold. You have a greater risk of getting burned. Activity  Do not stay in bed. Staying in  bed for more than 1-2 days can delay your recovery. Sit up and stand up straight. Avoid leaning forward when you sit or hunching over when you stand. If you work at a desk, sit close to it so you do not need to lean over. Keep your chin tucked in. Keep your neck drawn back, and keep your elbows bent at a 90-degree angle (right angle). Sit high and close to the steering wheel when you drive. Add lower back (lumbar) support to your car seat, if needed. Take short walks on even surfaces as soon as you are able. Try to increase the length of time you walk each day. Do not sit, drive, or stand in one place for more than 30 minutes at a time. Sitting or standing for long periods of time can put stress on your back. Do not drive or use heavy machinery while taking prescription pain medicine. Use proper lifting techniques. When you bend and lift, use positions that put less stress on your back: Bend your knees. Keep the load close to your body. Avoid twisting. Exercise regularly as told by your health care provider. Exercising helps your back heal faster and helps prevent back injuries by keeping muscles strong and flexible. Work with a physical therapist to make a safe exercise program, as recommended by your health care provider. Do any exercises as told by your physical therapist. Lifestyle Maintain a healthy weight. Extra weight puts stress on your back and makes it difficult to have good   posture. Avoid activities or situations that make you feel anxious or stressed. Stress and anxiety increase muscle tension and can make back pain worse. Learn ways to manage anxiety and stress, such as through exercise. General instructions Sleep on a firm mattress in a comfortable position. Try lying on your side with your knees slightly bent. If you lie on your back, put a pillow under your knees. Keep your head and neck in a straight line with your spine (neutral position) when using electronic equipment like  smartphones or pads. To do this: Raise your smartphone or pad to look at it instead of bending your head or neck to look down. Put the smartphone or pad at the level of your face while looking at the screen. Follow your treatment plan as told by your health care provider. This may include: Cognitive or behavioral therapy. Acupuncture or massage therapy. Meditation or yoga. Contact a health care provider if: You have pain that is not relieved with rest or medicine. You have increasing pain going down into your legs or buttocks. Your pain does not improve after 2 weeks. You have pain at night. You lose weight without trying. You have a fever or chills. You develop nausea or vomiting. You develop abdominal pain. Get help right away if: You develop new bowel or bladder control problems. You have unusual weakness or numbness in your arms or legs. You feel faint. These symptoms may represent a serious problem that is an emergency. Do not wait to see if the symptoms will go away. Get medical help right away. Call your local emergency services (911 in the [place] Do not drive yourself to the hospital. Summary Acute back pain is sudden and usually short-lived. Use proper lifting techniques. When you bend and lift, use positions that put less stress on your back. Take over-the-counter and prescription medicines only as told by your health care provider, and apply heat or ice as told. This information is not intended to replace advice given to you by your health care provider. Make sure you discuss any questions you have with your health care provider. Document Revised: 06/30/2020 Document Reviewed: 06/30/2020 Elsevier Patient Education  2023 [employer] Inc[employer]  

## 2022-08-13 NOTE — Progress Notes (Unsigned)
Subjective:     Patient ID: Morgan Norman , female    DOB: October 28, 1955 , 67 y.o.   MRN: [id number]   Chief Complaint  Patient presents with   Follow-up    HPI  Pt presents today for urgent care follow up. She visited [facility] on 4/15/ 24 for further evaluation of back pain. She states she was raking the leaves on Saturday, 08/03/22 when she felt a "ripping" pain in her back. She stopped and went into the house. She took a muscle relaxer, ibuprofen w/o relief of her sx.  Therefore, she went to [facility] UC in [place].  X-ray performed showed indeterminate L1-L4 compression fractures. She has h/o L1 compression fracture in 2019 that she suffered after a fall.  She has been on Fosamax in the past; however, she is not currently on this medication.  She was prescribed meloxicam by UC; however, she stopped it because she did not like the way it made her feel. She was seen by Neurosurgery in the past.   Today, the patient reports pain has gotten better. She denies LE weakness/paresthesias. There is some pain with positional changes. No urinary/fecal incontinence. Last bone density Jan 2023, POS osteopenia.        Past Medical History:  Diagnosis Date   Benign hypertensive renal disease    CKD (chronic kidney disease) stage 2, GFR 60-89 ml/min    Hypertension    Migraines    Overweight    Pure hypercholesterolemia    Vitamin D deficiency      Family History  Problem Relation Age of Onset   Diabetes Mellitus I Mother    Bladder Cancer Mother    Dementia Mother    Alcoholism Father      Current Outpatient Medications:    Cholecalciferol 5000 UNITS TABS, Take 5,000 Units by mouth., Disp: , Rfl:    cyclobenzaprine (FLEXERIL) 5 MG tablet, Take 1 tablet (5 mg total) by mouth daily as needed for muscle spasms., Disp: 30 tablet, Rfl: 0   ELDERBERRY PO, Take 1,000 mg by mouth., Disp: , Rfl:    Ginkgo Biloba  (GINKOBA PO), Take by mouth., Disp: , Rfl:    loratadine (CLARITIN) 10 MG tablet, Take 10 mg by mouth daily as needed for allergies., Disp: , Rfl:    meclizine (ANTIVERT) 25 MG tablet, Take 1 tablet (25 mg total) by mouth 3 (three) times daily as needed for dizziness., Disp: 30 tablet, Rfl: 0   meloxicam (MOBIC) 15 MG tablet, , Disp: , Rfl:    Multiple Vitamins-Minerals (IMMUNE SUPPORT PO), Take 1,000 mg by mouth daily at 6 (six) AM., Disp: , Rfl:    nystatin cream (MYCOSTATIN), Apply topically 3 (three) times daily., Disp: 30 g, Rfl: 1   Omega-3 Fatty Acids (FISH OIL PO), Take 120 mg by mouth., Disp: , Rfl:    rosuvastatin (CRESTOR) 10 MG tablet, TAKE 1 TABLET(10 MG) BY MOUTH DAILY, Disp: 90 tablet, Rfl: 2   telmisartan (MICARDIS) 20 MG tablet, TAKE 1 TABLET(20 MG) BY MOUTH DAILY, Disp: 90 tablet, Rfl: 2   traMADol (ULTRAM) 50 MG tablet, Take 1 tablet (50 mg total) by mouth every 6 (six) hours as needed., Disp: 20 tablet, Rfl: 0   triamcinolone cream (KENALOG) 0.1 %, Apply 1 application. topically 2 (two) times daily., Disp: 30 g, Rfl: 1   Estradiol (VAGIFEM) 10 MCG TABS vaginal tablet, Insert vaginally twice weekly (Patient not taking: Reported  on 08/13/2022), Disp: 8 tablet, Rfl: 0   No Known Allergies   Review of Systems  Constitutional: Negative.   Respiratory: Negative.    Cardiovascular: Negative.   Gastrointestinal: Negative.   Musculoskeletal:  Positive for back pain.  Skin: Negative.   Neurological: Negative.   Psychiatric/Behavioral: Negative.       Today's Vitals   08/13/22 1000  BP: 136/80  Pulse: 80  Temp: 98.1 F (36.7 C)  SpO2: 98%  Weight: 172 lb 6.4 oz (78.2 kg)  Height:  (1.6 m)  PainSc: 4    Body mass index is 30.54 kg/m.  Wt Readings from Last 3 Encounters:  08/13/22 172 lb 6.4 oz (78.2 kg)  05/27/22 168 lb 3.2 oz (76.3 kg)  11/22/21 164 lb 9.6 oz (74.7 kg)    Objective:  Physical Exam Vitals and nursing note reviewed.  Constitutional:       General: She is in acute distress.     Appearance: Normal appearance.  HENT:     Head: Normocephalic and atraumatic.  Eyes:     Extraocular Movements: Extraocular movements intact.  Cardiovascular:     Rate and Rhythm: Normal rate and regular rhythm.     Heart sounds: Normal heart sounds.  Pulmonary:     Effort: Pulmonary effort is normal.     Breath sounds: Normal breath sounds.  Musculoskeletal:     Cervical back: Normal range of motion.  Skin:    General: Skin is warm.  Neurological:     General: No focal deficit present.     Mental Status: She is alert.  Psychiatric:        Mood and Affect: Mood normal.        Behavior: Behavior normal.         Assessment And Plan:     1. Compression fracture of lumbar spine, non-traumatic, sequela Comments: UC records reviewed in Care Everywhere. I will refer her to [facility] once MRI results are available. . - MR Lumbar Spine Wo Contrast; Future  2. Osteopenia of lumbar spine Comments: Dexa scan results reviewed during her visit. Has been on Fosamax in the past, no longer on meds. Will need to revisit bisphosphanate use. I will also consider Rheumatology evaluation once MRI results are available for review.   3. Class 1 obesity due to excess calories with serious comorbidity and body mass index (BMI) of 30.0 to 30.9 in adult She is encouraged to strive for BMI less than 30 to decrease cardiac risk. Advised to aim for at least 150 minutes of exercise per week.    Patient was given opportunity to ask questions. Patient verbalized understanding of the plan and was able to repeat key elements of the plan. All questions were answered to their satisfaction.   I, [name], MD, have reviewed all documentation for this visit. The documentation on 08/13/22 for the exam, diagnosis, procedures, and orders are all accurate and complete.   IF YOU HAVE BEEN REFERRED TO A SPECIALIST, IT MAY TAKE 1-2 WEEKS TO SCHEDULE/PROCESS THE  REFERRAL. IF YOU HAVE NOT HEARD FROM US/SPECIALIST IN TWO WEEKS, PLEASE GIVE [place] A CALL AT [phone].   THE PATIENT IS ENCOURAGED TO PRACTICE SOCIAL DISTANCING DUE TO THE COVID-19 PANDEMIC.

## 2022-08-20 ENCOUNTER — Encounter: Payer: Self-pay | Admitting: Internal Medicine

## 2022-08-21 DIAGNOSIS — M4856XS Collapsed vertebra, not elsewhere classified, lumbar region, sequela of fracture: Secondary | ICD-10-CM | POA: Diagnosis not present

## 2022-08-26 ENCOUNTER — Encounter: Payer: Self-pay | Admitting: Internal Medicine

## 2022-08-28 ENCOUNTER — Other Ambulatory Visit: Payer: Self-pay | Admitting: Internal Medicine

## 2022-08-28 DIAGNOSIS — M545 Low back pain, unspecified: Secondary | ICD-10-CM

## 2022-08-28 DIAGNOSIS — M8580 Other specified disorders of bone density and structure, unspecified site: Secondary | ICD-10-CM

## 2022-08-28 DIAGNOSIS — Z8781 Personal history of (healed) traumatic fracture: Secondary | ICD-10-CM

## 2022-08-28 MED ORDER — ALENDRONATE SODIUM 70 MG PO TABS: 70.0000 mg | ORAL_TABLET | ORAL | 11 refills | Status: DC

## 2022-10-01 ENCOUNTER — Ambulatory Visit (INDEPENDENT_AMBULATORY_CARE_PROVIDER_SITE_OTHER): Payer: Medicare PPO | Admitting: Internal Medicine

## 2022-10-01 ENCOUNTER — Encounter: Payer: Self-pay | Admitting: Internal Medicine

## 2022-10-01 VITALS — BP 130/80 | HR 78 | Temp 98.4°F | Ht 63.0 in | Wt 170.4 lb

## 2022-10-01 DIAGNOSIS — N182 Chronic kidney disease, stage 2 (mild): Secondary | ICD-10-CM | POA: Diagnosis not present

## 2022-10-01 DIAGNOSIS — Z87898 Personal history of other specified conditions: Secondary | ICD-10-CM | POA: Diagnosis not present

## 2022-10-01 DIAGNOSIS — I129 Hypertensive chronic kidney disease with stage 1 through stage 4 chronic kidney disease, or unspecified chronic kidney disease: Secondary | ICD-10-CM | POA: Diagnosis not present

## 2022-10-01 DIAGNOSIS — M8580 Other specified disorders of bone density and structure, unspecified site: Secondary | ICD-10-CM

## 2022-10-01 MED ORDER — SCOPOLAMINE 1 MG/3DAYS TD PT72: 1.0000 | MEDICATED_PATCH | TRANSDERMAL | 1 refills | Status: DC

## 2022-10-01 NOTE — Patient Instructions (Signed)
Osteopenia  Osteopenia is a loss of thickness (density) inside the bones. Another name for osteopenia is low bone mass. Mild osteopenia is a normal part of aging. It is not a disease, and it does not cause symptoms. However, if you have osteopenia and continue to lose bone mass, you could develop a condition that causes the bones to become thin and break more easily (osteoporosis). Osteoporosis can cause you to lose some height, have back pain, and have a stooped posture. Although osteopenia is not a disease, making changes to your lifestyle and diet can help to prevent osteopenia from developing into osteoporosis. What are the causes? Osteopenia is caused by loss of calcium in the bones. Bones are constantly changing. Old bone cells are continually being replaced with new bone cells. This process builds new bone. The mineral calcium is needed to build new bone and maintain bone density. Bone density is usually highest around age 94. After that, most people's bodies cannot replace all the bone they have lost with new bone. What increases the risk? You are more likely to develop this condition if: You are older than age 53. You are a woman who went through menopause early. You have a long illness that keeps you in bed. You do not get enough exercise. You lack certain nutrients (malnutrition). You have an overactive thyroid gland (hyperthyroidism). You use products that contain nicotine or tobacco, such as cigarettes, e-cigarettes and chewing tobacco, or you drink a lot of alcohol. You are taking medicines that weaken the bones, such as steroids. What are the signs or symptoms? This condition does not cause any symptoms. You may have a slightly higher risk for bone breaks (fractures), so getting fractures more easily than normal may be an indication of osteopenia. How is this diagnosed? This condition may be diagnosed based on an X-ray exam that measures bone density (dual-energy X-ray  absorptiometry, or DEXA). This test can measure bone density in your hips, spine, and wrists. Osteopenia has no symptoms, so this condition is usually diagnosed after a routine bone density screening test is done for osteoporosis. This routine screening is usually done for: Women who are age 63 or older. Men who are age 41 or older. If you have risk factors for osteopenia, you may have the screening test at an earlier age. How is this treated? Making dietary and lifestyle changes can lower your risk for osteoporosis. If you have severe osteopenia that is close to becoming osteoporosis, this condition can be treated with medicines and dietary supplements such as calcium and vitamin D. These supplements help to rebuild bone density. Follow these instructions at home: Eating and drinking Eat a diet that is high in calcium and vitamin D. Calcium is found in dairy products, beans, salmon, and leafy green vegetables like spinach and broccoli. Look for foods that have vitamin D and calcium added to them (fortified foods), such as orange juice, cereal, and bread.  Lifestyle Do 30 minutes or more of a weight-bearing exercise every day, such as walking, jogging, or playing a sport. These types of exercises strengthen the bones. Do not use any products that contain nicotine or tobacco, such as cigarettes, e-cigarettes, and chewing tobacco. If you need help quitting, ask your health care provider. Do not drink alcohol if: Your health care provider tells you not to drink. You are pregnant, may be pregnant, or are planning to become pregnant. If you drink alcohol: Limit how much you use to: 0-1 drink a day for women. 0-2  drinks a day for men. Be aware of how much alcohol is in your drink. In the [place] one drink equals one 12 oz bottle of beer (355 mL), one 5 oz glass of wine (148 mL), or one 1 oz glass of hard liquor (44 mL). General instructions Take over-the-counter and prescription medicines only as  told by your health care provider. These include vitamins and supplements. Take precautions at home to lower your risk of falling, such as: Keeping rooms well-lit and free of clutter, such as cords. Installing safety rails on stairs. Using rubber mats in the bathroom or other areas that are often wet or slippery. Keep all follow-up visits. This is important. Contact a health care provider if: You have not had a bone density screening for osteoporosis and you are: A woman who is age 72 or older. A man who is age 15 or older. You are a postmenopausal woman who has not had a bone density screening for osteoporosis. You are older than age 37 and you want to know if you should have bone density screening for osteoporosis. Summary Osteopenia is a loss of thickness (density) inside the bones. Another name for osteopenia is low bone mass. Osteopenia is not a disease, but it may increase your risk for a condition that causes the bones to become thin and break more easily (osteoporosis). You may be at risk for osteopenia if you are older than age 56 or if you are a woman who went through early menopause. Osteopenia does not cause any symptoms, but it can be diagnosed with a bone density screening test. Dietary and lifestyle changes are the first treatment for osteopenia. These may lower your risk for osteoporosis. This information is not intended to replace advice given to you by your health care provider. Make sure you discuss any questions you have with your health care provider. Document Revised: 09/23/2019 Document Reviewed: 09/23/2019 Elsevier Patient Education  2024 [employer].

## 2022-10-01 NOTE — Progress Notes (Unsigned)
Subjective:  Patient ID: Morgan Norman , female    DOB: [birth year] , 67 y.o.   MRN: [id number]  Chief Complaint  Patient presents with   med check    HPI  Patient presents today for Fosamax follow up. Patient reports she is not taking the medication because she has a sore on her butt. She reports she noticed it on wednesday. She reports the sore is better there is just a scar there now.     Past Medical History:  Diagnosis Date   Benign hypertensive renal disease    CKD (chronic kidney disease) stage 2, GFR 60-89 ml/min    Hypertension    Migraines    Overweight    Pure hypercholesterolemia    Vitamin D deficiency      Family History  Problem Relation Age of Onset   Diabetes Mellitus I Mother    Bladder Cancer Mother    Dementia Mother    Alcoholism Father      Current Outpatient Medications:    alendronate (FOSAMAX) 70 MG tablet, Take 1 tablet (70 mg total) by mouth every 7 (seven) days. Take with a full glass of water on an empty stomach., Disp: 4 tablet, Rfl: 11   Cholecalciferol 5000 UNITS TABS, Take 5,000 Units by mouth., Disp: , Rfl:    cyclobenzaprine (FLEXERIL) 5 MG tablet, Take 1 tablet (5 mg total) by mouth daily as needed for muscle spasms., Disp: 30 tablet, Rfl: 0   ELDERBERRY PO, Take 1,000 mg by mouth., Disp: , Rfl:    Ginkgo Biloba (GINKOBA PO), Take by mouth., Disp: , Rfl:    loratadine (CLARITIN) 10 MG tablet, Take 10 mg by mouth daily as needed for allergies., Disp: , Rfl:    meclizine (ANTIVERT) 25 MG tablet, Take 1 tablet (25 mg total) by mouth 3 (three) times daily as needed for dizziness., Disp: 30 tablet, Rfl: 0   meloxicam (MOBIC) 15 MG tablet, , Disp: , Rfl:    Multiple Vitamins-Minerals (IMMUNE SUPPORT PO), Take 1,000 mg by mouth daily at 6 (six) AM., Disp: , Rfl:    nystatin cream (MYCOSTATIN), Apply topically 3 (three) times daily., Disp: 30 g, Rfl: 1   Omega-3 Fatty Acids (FISH OIL PO), Take 120 mg by mouth., Disp: , Rfl:     rosuvastatin (CRESTOR) 10 MG tablet, TAKE 1 TABLET(10 MG) BY MOUTH DAILY, Disp: 90 tablet, Rfl: 2   scopolamine (TRANSDERM SCOP, 1.5 MG,) 1 MG/3DAYS, Place 1 patch (1.5 mg total) onto the skin every 3 (three) days., Disp: 4 patch, Rfl: 1   telmisartan (MICARDIS) 20 MG tablet, TAKE 1 TABLET(20 MG) BY MOUTH DAILY, Disp: 90 tablet, Rfl: 2   traMADol (ULTRAM) 50 MG tablet, Take 1 tablet (50 mg total) by mouth every 6 (six) hours as needed., Disp: 20 tablet, Rfl: 0   triamcinolone cream (KENALOG) 0.1 %, Apply 1 application. topically 2 (two) times daily., Disp: 30 g, Rfl: 1   No Known Allergies   Review of Systems  Constitutional: Negative.   Respiratory: Negative.    Cardiovascular: Negative.   Skin: Negative.   Neurological: Negative.   Psychiatric/Behavioral: Negative.       Today's Vitals   10/01/22 1152 10/01/22 1219  BP: (!) 140/86 130/80  Pulse: 78   Temp: 98.4 F (36.9 C)   Weight: 170 lb 6.4 oz (77.3 kg)   Height: 5\' 3"  (1.6 m)   PainSc: 0-No pain    Body mass index is 30.19 kg/m.  Wt  Readings from Last 3 Encounters:  10/01/22 170 lb 6.4 oz (77.3 kg)  08/13/22 172 lb 6.4 oz (78.2 kg)  05/27/22 168 lb 3.2 oz (76.3 kg)    The 10-year ASCVD risk score (Arnett DK, et al., 2019) is: 10.7%   Values used to calculate the score:     Age: 43 years     Sex: Female     Is Non-Hispanic African American: Yes     Diabetic: No     Tobacco smoker: No     Systolic Blood Pressure: 130 mmHg     Is BP treated: Yes     HDL Cholesterol: 65 mg/dL     Total Cholesterol: 179 mg/dL  Objective:  Physical Exam Vitals and nursing note reviewed.  Constitutional:      Appearance: Normal appearance.  HENT:     Head: Normocephalic and atraumatic.  Eyes:     Extraocular Movements: Extraocular movements intact.  Cardiovascular:     Rate and Rhythm: Normal rate and regular rhythm.     Heart sounds: Normal heart sounds.  Pulmonary:     Effort: Pulmonary effort is normal.     Breath sounds:  Normal breath sounds.  Musculoskeletal:     Cervical back: Normal range of motion.  Skin:    General: Skin is warm.  Neurological:     General: No focal deficit present.     Mental Status: She is alert.  Psychiatric:        Mood and Affect: Mood normal.        Behavior: Behavior normal.         Assessment And Plan:  1. Osteopenia, unspecified location - Ambulatory referral to Rheumatology  2. Hypertensive nephropathy  3. Chronic renal disease, stage II  4. History of motion sickness    Return if symptoms worsen or fail to improve.  Patient was given opportunity to ask questions. Patient verbalized understanding of the plan and was able to repeat key elements of the plan. All questions were answered to their satisfaction.   I, [name], MD, have reviewed all documentation for this visit. The documentation on 10/01/22 for the exam, diagnosis, procedures, and orders are all accurate and complete.   IF YOU HAVE BEEN REFERRED TO A SPECIALIST, IT MAY TAKE 1-2 WEEKS TO SCHEDULE/PROCESS THE REFERRAL. IF YOU HAVE NOT HEARD FROM US/SPECIALIST IN TWO WEEKS, PLEASE GIVE [place] A CALL AT [phone].

## 2022-10-15 ENCOUNTER — Telehealth (INDEPENDENT_AMBULATORY_CARE_PROVIDER_SITE_OTHER): Payer: Medicare PPO | Admitting: Internal Medicine

## 2022-10-15 ENCOUNTER — Encounter: Payer: Self-pay | Admitting: Internal Medicine

## 2022-10-15 DIAGNOSIS — U071 COVID-19: Secondary | ICD-10-CM

## 2022-10-15 MED ORDER — PAXLOVID (300/100) 20 X 150 MG & 10 X 100MG PO TBPK: ORAL_TABLET | ORAL | 0 refills | Status: DC

## 2022-10-15 NOTE — Progress Notes (Signed)
Virtual Visit via Video   This visit type was conducted due to national recommendations for restrictions regarding the COVID-19 Pandemic (e.g. social distancing) in an effort to limit this patient's exposure and mitigate transmission in our community.  Due to her co-morbid illnesses, this patient is at least at moderate risk for complications without adequate follow up.  This format is felt to be most appropriate for this patient at this time.  All issues noted in this document were discussed and addressed.  A limited physical exam was performed with this format.    This visit type was conducted due to national recommendations for restrictions regarding the COVID-19 Pandemic (e.g. social distancing) in an effort to limit this patient's exposure and mitigate transmission in our community.  Patients identity confirmed using two different identifiers.  This format is felt to be most appropriate for this patient at this time.  All issues noted in this document were discussed and addressed.  No physical exam was performed (except for noted visual exam findings with Video Visits).    Date:  10/15/2022   ID:  Morgan Norman, DOB [birth year], MRN [id number]  Patient Location:  Home  Provider location:   Office    Chief Complaint:  "I went on a cruise and I have COVID"  History of Present Illness:    Morgan Norman is a 67 y.o. female who presents via video conferencing for a telehealth visit today.    The patient does have symptoms concerning for COVID-19 infection (fever, chills, cough, or new shortness of breath).   Patient presents virtually testing positive for covid. She reports she returned from [place] earlier this morning. She is coming back from a cruise.  She traveled with her husband. She developed nasal congestion on Sunday.  This morning, she took an at home test which came back positive. She admits her husband tested positive as well. She is experiencing stuffy  nose, headache, dry cough and chills. At home she has used Flonase & vapor rub with minimal improvement with her sx. She denies taking any OTC medicine.      Past Medical History:  Diagnosis Date   Benign hypertensive renal disease    CKD (chronic kidney disease) stage 2, GFR 60-89 ml/min    Hypertension    Migraines    Overweight    Pure hypercholesterolemia    Vitamin D deficiency    Past Surgical History:  Procedure Laterality Date   bilateral foot surgery     CESAREAN SECTION     TOTAL ABDOMINAL HYSTERECTOMY W/ BILATERAL SALPINGOOPHORECTOMY       Current Meds  Medication Sig   alendronate (FOSAMAX) 70 MG tablet Take 1 tablet (70 mg total) by mouth every 7 (seven) days. Take with a full glass of water on an empty stomach.   Cholecalciferol 5000 UNITS TABS Take 5,000 Units by mouth.   cyclobenzaprine (FLEXERIL) 5 MG tablet Take 1 tablet (5 mg total) by mouth daily as needed for muscle spasms.   ELDERBERRY PO Take 1,000 mg by mouth.   Ginkgo Biloba (GINKOBA PO) Take by mouth.   loratadine (CLARITIN) 10 MG tablet Take 10 mg by mouth daily as needed for allergies.   meclizine (ANTIVERT) 25 MG tablet Take 1 tablet (25 mg total) by mouth 3 (three) times daily as needed for dizziness.   meloxicam (MOBIC) 15 MG tablet    Multiple Vitamins-Minerals (IMMUNE SUPPORT PO) Take 1,000 mg by mouth daily at 6 (six) AM.   nirmatrelvir &  ritonavir (PAXLOVID, 300/100,) 20 x 150 MG & 10 x 100MG  TBPK Use as directed, take 3 capsules po twice daily   nystatin cream (MYCOSTATIN) Apply topically 3 (three) times daily.   Omega-3 Fatty Acids (FISH OIL PO) Take 120 mg by mouth.   rosuvastatin (CRESTOR) 10 MG tablet TAKE 1 TABLET(10 MG) BY MOUTH DAILY   scopolamine (TRANSDERM SCOP, 1.5 MG,) 1 MG/3DAYS Place 1 patch (1.5 mg total) onto the skin every 3 (three) days.   telmisartan (MICARDIS) 20 MG tablet TAKE 1 TABLET(20 MG) BY MOUTH DAILY   traMADol (ULTRAM) 50 MG tablet Take 1 tablet (50 mg total) by  mouth every 6 (six) hours as needed.   triamcinolone cream (KENALOG) 0.1 % Apply 1 application. topically 2 (two) times daily.     Allergies:   Patient has no known allergies.   Social History   Tobacco Use   Smoking status: Never   Smokeless tobacco: Never  Vaping Use   Vaping Use: Never used  Substance Use Topics   Alcohol use: Not Currently    Alcohol/week: 0.0 standard drinks of alcohol    Comment: rarely   Drug use: No     Family Hx: The patient's family history includes Alcoholism in her father; Bladder Cancer in her mother; Dementia in her mother; Diabetes Mellitus I in her mother.  ROS:   Please see the history of present illness.    Review of Systems  Constitutional:  Positive for chills and malaise/fatigue.  HENT:  Positive for congestion.   Respiratory:  Positive for cough.   Cardiovascular: Negative.   Gastrointestinal: Negative.   Neurological: Negative.   Endo/Heme/Allergies: Negative.   Psychiatric/Behavioral: Negative.      All other systems reviewed and are negative.   Labs/Other Tests and Data Reviewed:    Recent Labs: 05/27/2022: ALT 12; BUN 10; Creatinine, Ser 0.82; Potassium 4.1; Sodium 141 06/27/2022: Hemoglobin 12.9; Platelets 103   Recent Lipid Panel Lab Results  Component Value Date/Time   CHOL 179 05/27/2022 09:58 AM   TRIG 63 05/27/2022 09:58 AM   HDL 65 05/27/2022 09:58 AM   CHOLHDL 2.8 05/27/2022 09:58 AM   LDLCALC 102 (H) 05/27/2022 09:58 AM    Wt Readings from Last 3 Encounters:  10/01/22 170 lb 6.4 oz (77.3 kg)  08/13/22 172 lb 6.4 oz (78.2 kg)  05/27/22 168 lb 3.2 oz (76.3 kg)     Exam:    Vital Signs:  There were no vitals taken for this visit.    Physical Exam Vitals and nursing note reviewed.  HENT:     Head: Normocephalic and atraumatic.     Comments: She sounds congested Eyes:     Extraocular Movements: Extraocular movements intact.  Pulmonary:     Effort: Pulmonary effort is normal.     Comments: She is able  to talk in full sentences, no labored breathing Musculoskeletal:     Cervical back: Normal range of motion.  Neurological:     Mental Status: She is alert and oriented to person, place, and time.  Psychiatric:        Mood and Affect: Affect normal.     ASSESSMENT & PLAN:     COVID Assessment & Plan:  She would like treatment, rx paxlovid sent to her pharmacy. Advised to take full course, possible side effects d/w patient. I will also refer her for home monitoring/temperature monitoring program. She is encouraged to email me daily on Mychart to let me know how she is doing.  She is encouraged to go to ER should she develop worsening SOB. Pt advised that she should mask for 10 days. She is also advised to stay well hydrated, move periodically throughout the day and to have a hot beverage daily.  She verbalizes understanding of her treatment plan. All questions were answered to her satisfaction. She understands that she needs to continue to self quarantine    Orders: -     MyChart Temperature FLOWSHEET; Future  Other orders -     MYCHART COVID-19 HOME MONITORING PROGRAM; Future -     Paxlovid (300/100); Use as directed, take 3 capsules po twice daily  Dispense: 30 each; Refill: 0     COVID-19 Education: The signs and symptoms of COVID-19 were discussed with the patient and how to seek care for testing (follow up with PCP or arrange E-visit).  The importance of social distancing was discussed today.  Patient Risk:   After full review of this patients clinical status, I feel that they are at least moderate risk at this time.  Time:   Today, I have spent 8 minutes/ seconds with the patient with telehealth technology discussing above diagnoses.     Medication Adjustments/Labs and Tests Ordered: Current medicines are reviewed at length with the patient today.  Concerns regarding medicines are outlined above.   Tests Ordered: No orders of the defined types were placed in this  encounter.   Medication Changes: Meds ordered this encounter  Medications   nirmatrelvir & ritonavir (PAXLOVID, 300/100,) 20 x 150 MG & 10 x 100MG  TBPK    Sig: Use as directed, take 3 capsules po twice daily    Dispense:  30 each    Refill:  0    Disposition:  Follow up prn  Signed, [name], MD

## 2022-10-15 NOTE — Patient Instructions (Addendum)
Oscillococcinum - in cough/cold section of pharmacy  COVID-19 COVID-19 is an infection caused by a virus called SARS-CoV-2. This type of virus is called a coronavirus. People with COVID-19 may: Have little to no symptoms. Have mild to moderate symptoms that affect their lungs and breathing. Get very sick. What are the causes? COVID-19 is caused by a virus. This virus may be in the air as droplets or on surfaces. It can spread from an infected person when they cough, sneeze, speak, sing, or breathe. You may become infected if: You breathe in the infected droplets in the air. You touch an object that has the virus on it. What increases the risk? You are at risk of getting COVID-19 if you have been around someone with the infection. You may be more likely to get very sick if: You are 35 years old or older. You have certain medical conditions, such as: Heart disease. Diabetes. Chronic respiratory disease. Cancer. Pregnancy. You are immunocompromised. This means your body cannot fight infections easily. You have a disability or trouble moving, meaning you're immobile. What are the signs or symptoms? People may have different symptoms from COVID-19. The symptoms can also be mild to severe. They often show up in 5-6 days after being infected. But they can take up to 14 days to appear. Common symptoms are: Cough. Feeling tired. New loss of taste or smell. Fever. Less common symptoms are: Sore throat. Headache. Body or muscle aches. Diarrhea. A skin rash or odd-colored fingers or toes. Red or irritated eyes. Sometimes, COVID-19 does not cause symptoms. How is this diagnosed? COVID-19 can be diagnosed with tests done in the lab or at home. Fluid from your nose, mouth, or lungs will be used to check for the virus. How is this treated? Treatment for COVID-19 depends on how sick you are. Mild symptoms can be treated at home with rest, fluids, and over-the-counter medicines. Severe  symptoms may be treated in a hospital intensive care unit (ICU). If you have symptoms and are at risk of getting very sick, you may be given a medicine that fights viruses. This medicine is called an antiviral. How is this prevented? To protect yourself from COVID-19: Know your risk factors. Get vaccinated. If your body cannot fight infections easily, talk to your provider about treatment to help prevent COVID-19. Stay at least 1 meter away from others. Wear a well-fitted mask when: You can't stay at a distance from people. You're in a place with poor air flow. Try to be in open spaces with good air flow when in public. Wash your hands often or use an alcohol-based hand sanitizer. Cover your nose and mouth when coughing and sneezing. If you think you have COVID-19 or have been around someone who has it, stay home and be by yourself for 5-10 days. Where to find more information Centers for Disease Control and Prevention (CDC): [employer] World Health Organization [facility]): VisitDestination.com.br Get help right away if: You have trouble breathing or get short of breath. You have pain or pressure in your chest. You cannot speak or move any part of your body. You are confused. Your symptoms get worse. These symptoms may be an emergency. Get help right away. Call 911. Do not wait to see if the symptoms will go away. Do not drive yourself to the hospital. This information is not intended to replace advice given to you by your health care provider. Make sure you discuss any questions you have with your health care provider. Document Revised:  04/16/2022 Document Reviewed: 12/21/2021 Elsevier Patient Education  2024 [employer].

## 2022-10-15 NOTE — Assessment & Plan Note (Addendum)
She would like treatment, rx paxlovid sent to her pharmacy. Advised to take full course, possible side effects d/w patient. I will also refer her for home monitoring/temperature monitoring program. She is encouraged to email me daily on Mychart to let me know how she is doing. She is encouraged to go to ER should she develop worsening SOB. Pt advised that she should mask for 10 days. She is also advised to stay well hydrated, move periodically throughout the day and to have a hot beverage daily.  She verbalizes understanding of her treatment plan. All questions were answered to her satisfaction. She understands that she needs to continue to self quarantine

## 2022-11-18 ENCOUNTER — Other Ambulatory Visit: Payer: Self-pay | Admitting: Nurse Practitioner

## 2022-11-21 ENCOUNTER — Ambulatory Visit: Payer: Medicare PPO | Admitting: Internal Medicine

## 2022-11-28 ENCOUNTER — Ambulatory Visit: Payer: Medicare PPO

## 2022-11-28 DIAGNOSIS — Z0000 Encounter for general adult medical examination without abnormal findings: Secondary | ICD-10-CM

## 2022-11-28 NOTE — Patient Instructions (Signed)
Morgan Norman , Thank you for taking time to come for your Medicare Wellness Visit. I appreciate your ongoing commitment to your health goals. Please review the following plan we discussed and let me know if I can assist you in the future.   Referrals/Orders/Follow-Ups/Clinician Recommendations: none  This is a list of the screening recommended for you and due dates:  Health Maintenance  Topic Date Due   COVID-19 Vaccine (5 - 2023-24 season) 12/21/2021   Flu Shot  11/21/2022   DTaP/Tdap/Td vaccine (3 - Tdap) 10/20/2023   Medicare Annual Wellness Visit  11/28/2023   Mammogram  05/17/2024   Colon Cancer Screening  04/01/2028   Pneumonia Vaccine  Completed   DEXA scan (bone density measurement)  Completed   Hepatitis C Screening  Completed   Zoster (Shingles) Vaccine  Completed   HPV Vaccine  Aged Out    Advanced directives: (Copy Requested) Please bring a copy of your health care power of attorney and living will to the office to be added to your chart at your convenience.  Next Medicare Annual Wellness Visit scheduled for next year: No, office will schedule appointment  Preventive Care 65 Years and Older, Female Preventive care refers to lifestyle choices and visits with your health care provider that can promote health and wellness. What does preventive care include? A yearly physical exam. This is also called an annual well check. Dental exams once or twice a year. Routine eye exams. Ask your health care provider how often you should have your eyes checked. Personal lifestyle choices, including: Daily care of your teeth and gums. Regular physical activity. Eating a healthy diet. Avoiding tobacco and drug use. Limiting alcohol use. Practicing safe sex. Taking low-dose aspirin every day. Taking vitamin and mineral supplements as recommended by your health care provider. What happens during an annual well check? The services and screenings done by your health care  provider during your annual well check will depend on your age, overall health, lifestyle risk factors, and family history of disease. Counseling  Your health care provider may ask you questions about your: Alcohol use. Tobacco use. Drug use. Emotional well-being. Home and relationship well-being. Sexual activity. Eating habits. History of falls. Memory and ability to understand (cognition). Work and work Astronomer. Reproductive health. Screening  You may have the following tests or measurements: Height, weight, and BMI. Blood pressure. Lipid and cholesterol levels. These may be checked every 5 years, or more frequently if you are over 46 years old. Skin check. Lung cancer screening. You may have this screening every year starting at age 27 if you have a 30-pack-year history of smoking and currently smoke or have quit within the past 15 years. Fecal occult blood test (FOBT) of the stool. You may have this test every year starting at age 73. Flexible sigmoidoscopy or colonoscopy. You may have a sigmoidoscopy every 5 years or a colonoscopy every 10 years starting at age 33. Hepatitis C blood test. Hepatitis B blood test. Sexually transmitted disease (STD) testing. Diabetes screening. This is done by checking your blood sugar (glucose) after you have not eaten for a while (fasting). You may have this done every 1-3 years. Bone density scan. This is done to screen for osteoporosis. You may have this done starting at age 53. Mammogram. This may be done every 1-2 years. Talk to your health care provider about how often you should have regular mammograms. Talk with your health care provider about your test results, treatment options, and if necessary,  the need for more tests. Vaccines  Your health care provider may recommend certain vaccines, such as: Influenza vaccine. This is recommended every year. Tetanus, diphtheria, and acellular pertussis (Tdap, Td) vaccine. You may need a Td  booster every 10 years. Zoster vaccine. You may need this after age 41. Pneumococcal 13-valent conjugate (PCV13) vaccine. One dose is recommended after age 37. Pneumococcal polysaccharide (PPSV23) vaccine. One dose is recommended after age 82. Talk to your health care provider about which screenings and vaccines you need and how often you need them. This information is not intended to replace advice given to you by your health care provider. Make sure you discuss any questions you have with your health care provider. Document Released: 05/05/2015 Document Revised: 12/27/2015 Document Reviewed: 02/07/2015 Elsevier Interactive Patient Education  2017 [employer].  Fall Prevention in the Home Falls can cause injuries. They can happen to people of all ages. There are many things you can do to make your home safe and to help prevent falls. What can I do on the outside of my home? Regularly fix the edges of walkways and driveways and fix any cracks. Remove anything that might make you trip as you walk through a door, such as a raised step or threshold. Trim any bushes or trees on the path to your home. Use bright outdoor lighting. Clear any walking paths of anything that might make someone trip, such as rocks or tools. Regularly check to see if handrails are loose or broken. Make sure that both sides of any steps have handrails. Any raised decks and porches should have guardrails on the edges. Have any leaves, snow, or ice cleared regularly. Use sand or salt on walking paths during winter. Clean up any spills in your garage right away. This includes oil or grease spills. What can I do in the bathroom? Use night lights. Install grab bars by the toilet and in the tub and shower. Do not use towel bars as grab bars. Use non-skid mats or decals in the tub or shower. If you need to sit down in the shower, use a plastic, non-slip stool. Keep the floor dry. Clean up any water that spills on the floor  as soon as it happens. Remove soap buildup in the tub or shower regularly. Attach bath mats securely with double-sided non-slip rug tape. Do not have throw rugs and other things on the floor that can make you trip. What can I do in the bedroom? Use night lights. Make sure that you have a light by your bed that is easy to reach. Do not use any sheets or blankets that are too big for your bed. They should not hang down onto the floor. Have a firm chair that has side arms. You can use this for support while you get dressed. Do not have throw rugs and other things on the floor that can make you trip. What can I do in the kitchen? Clean up any spills right away. Avoid walking on wet floors. Keep items that you use a lot in easy-to-reach places. If you need to reach something above you, use a strong step stool that has a grab bar. Keep electrical cords out of the way. Do not use floor polish or wax that makes floors slippery. If you must use wax, use non-skid floor wax. Do not have throw rugs and other things on the floor that can make you trip. What can I do with my stairs? Do not leave any items on  the stairs. Make sure that there are handrails on both sides of the stairs and use them. Fix handrails that are broken or loose. Make sure that handrails are as long as the stairways. Check any carpeting to make sure that it is firmly attached to the stairs. Fix any carpet that is loose or worn. Avoid having throw rugs at the top or bottom of the stairs. If you do have throw rugs, attach them to the floor with carpet tape. Make sure that you have a light switch at the top of the stairs and the bottom of the stairs. If you do not have them, ask someone to add them for you. What else can I do to help prevent falls? Wear shoes that: Do not have high heels. Have rubber bottoms. Are comfortable and fit you well. Are closed at the toe. Do not wear sandals. If you use a stepladder: Make sure that it is  fully opened. Do not climb a closed stepladder. Make sure that both sides of the stepladder are locked into place. Ask someone to hold it for you, if possible. Clearly mark and make sure that you can see: Any grab bars or handrails. First and last steps. Where the edge of each step is. Use tools that help you move around (mobility aids) if they are needed. These include: Canes. Walkers. Scooters. Crutches. Turn on the lights when you go into a dark area. Replace any light bulbs as soon as they burn out. Set up your furniture so you have a clear path. Avoid moving your furniture around. If any of your floors are uneven, fix them. If there are any pets around you, be aware of where they are. Review your medicines with your doctor. Some medicines can make you feel dizzy. This can increase your chance of falling. Ask your doctor what other things that you can do to help prevent falls. This information is not intended to replace advice given to you by your health care provider. Make sure you discuss any questions you have with your health care provider. Document Released: 02/02/2009 Document Revised: 09/14/2015 Document Reviewed: 05/13/2014 Elsevier Interactive Patient Education  2017 [employer].

## 2022-11-28 NOTE — Progress Notes (Signed)
Subjective:   Morgan Norman is a 67 y.o. female who presents for Medicare Annual (Subsequent) preventive examination.  Visit Complete: Virtual  I connected with  Lamar Blinks Shipman-Lindsey on 11/28/22 by a audio enabled telemedicine application and verified that I am speaking with the correct person using two identifiers.  Patient Location: Home  Provider Location: Office/Clinic  I discussed the limitations of evaluation and management by telemedicine. The patient expressed understanding and agreed to proceed.  Vital Signs: Unable to obtain new vitals due to this being a telehealth visit.  Review of Systems     Cardiac Risk Factors include: advanced age (>11men, >82 women);hypertension     Objective:    Today's Vitals   There is no height or weight on file to calculate BMI.     11/07/2021    2:16 PM 10/11/2020   12:00 PM 10/07/2019   11:37 AM 10/06/2018   10:54 AM  Advanced Directives  Does Patient Have a Medical Advance Directive? Yes Yes Yes Yes  Type of Estate agent of [place];Living will Healthcare Power of [place];Living will Healthcare Power of [place];Living will Living will  Does patient want to make changes to medical advance directive?    No - Patient declined  Copy of Healthcare Power of Attorney in Chart? No - copy requested No - copy requested No - copy requested     Current Medications (verified) Outpatient Encounter Medications as of 11/28/2022  Medication Sig   alendronate (FOSAMAX) 70 MG tablet Take 1 tablet (70 mg total) by mouth every 7 (seven) days. Take with a full glass of water on an empty stomach.   Cholecalciferol 5000 UNITS TABS Take 5,000 Units by mouth.   cyclobenzaprine (FLEXERIL) 5 MG tablet Take 1 tablet (5 mg total) by mouth daily as needed for muscle spasms.   ELDERBERRY PO Take 1,000 mg by mouth.   Ginkgo Biloba (GINKOBA PO) Take by mouth.   loratadine (CLARITIN) 10 MG tablet Take 10 mg by mouth daily as  needed for allergies.   meclizine (ANTIVERT) 25 MG tablet Take 1 tablet (25 mg total) by mouth 3 (three) times daily as needed for dizziness.   meloxicam (MOBIC) 15 MG tablet    Multiple Vitamins-Minerals (IMMUNE SUPPORT PO) Take 1,000 mg by mouth daily at 6 (six) AM.   nystatin cream (MYCOSTATIN) Apply topically 3 (three) times daily.   Omega-3 Fatty Acids (FISH OIL PO) Take 120 mg by mouth.   rosuvastatin (CRESTOR) 10 MG tablet TAKE 1 TABLET(10 MG) BY MOUTH DAILY   scopolamine (TRANSDERM SCOP, 1.5 MG,) 1 MG/3DAYS Place 1 patch (1.5 mg total) onto the skin every 3 (three) days.   telmisartan (MICARDIS) 20 MG tablet TAKE 1 TABLET(20 MG) BY MOUTH DAILY   traMADol (ULTRAM) 50 MG tablet Take 1 tablet (50 mg total) by mouth every 6 (six) hours as needed.   triamcinolone cream (KENALOG) 0.1 % Apply 1 application. topically 2 (two) times daily.   nirmatrelvir & ritonavir (PAXLOVID, 300/100,) 20 x 150 MG & 10 x 100MG  TBPK Use as directed, take 3 capsules po twice daily (Patient not taking: Reported on 11/28/2022)   No facility-administered encounter medications on file as of 11/28/2022.    Allergies (verified) Patient has no known allergies.   History: Past Medical History:  Diagnosis Date   Benign hypertensive renal disease    CKD (chronic kidney disease) stage 2, GFR 60-89 ml/min    Hypertension    Migraines    Overweight  Pure hypercholesterolemia    Vitamin D deficiency    Past Surgical History:  Procedure Laterality Date   bilateral foot surgery     CESAREAN SECTION     TOTAL ABDOMINAL HYSTERECTOMY W/ BILATERAL SALPINGOOPHORECTOMY     Family History  Problem Relation Age of Onset   Diabetes Mellitus I Mother    Bladder Cancer Mother    Dementia Mother    Alcoholism Father    Social History   Socioeconomic History   Marital status: Married    Spouse name: Not on file   Number of children: Not on file   Years of education: Not on file   Highest education level: Bachelor's  degree (e.g., BA, AB, BS)  Occupational History   Occupation: retired  Tobacco Use   Smoking status: Never   Smokeless tobacco: Never  Vaping Use   Vaping status: Never Used  Substance and Sexual Activity   Alcohol use: Not Currently    Alcohol/week: 0.0 standard drinks of alcohol    Comment: rarely   Drug use: No   Sexual activity: Yes  Other Topics Concern   Not on file  Social History Narrative   Drinks 2 cups of coffee daily.   Social Determinants of Health   Financial Resource Strain: Low Risk  (11/28/2022)   Overall Financial Resource Strain (CARDIA)    Difficulty of Paying Living Expenses: Not hard at all  Food Insecurity: No Food Insecurity (11/28/2022)   Hunger Vital Sign    Worried About Running Out of Food in the Last Year: Never true    Ran Out of Food in the Last Year: Never true  Transportation Needs: No Transportation Needs (08/12/2022)   PRAPARE - Administrator, Civil Service (Medical): No    Lack of Transportation (Non-Medical): No  Physical Activity: Inactive (11/28/2022)   Exercise Vital Sign    Days of Exercise per Week: 0 days    Minutes of Exercise per Session: 0 min  Stress: No Stress Concern Present (11/28/2022)   [employer] of Occupational Health - Occupational Stress Questionnaire    Feeling of Stress : Not at all  Social Connections: Moderately Isolated (11/28/2022)   Social Connection and Isolation Panel [NHANES]    Frequency of Communication with Friends and Family: More than three times a week    Frequency of Social Gatherings with Friends and Family: More than three times a week    Attends Religious Services: Never    Database administrator or Organizations: No    Attends Engineer, structural: Never    Marital Status: Married    Tobacco Counseling Counseling given: Not Answered   Clinical Intake:  Pre-visit preparation completed: Yes  Pain : No/denies pain     Nutritional Risks: None Diabetes: No  How  often do you need to have someone help you when you read instructions, pamphlets, or other written materials from your doctor or pharmacy?: 1 - Never  Interpreter Needed?: No  Information entered by :: NAllen LPN   Activities of Daily Living    11/28/2022   11:22 AM  In your present state of health, do you have any difficulty performing the following activities:  Hearing? 0  Vision? 0  Difficulty concentrating or making decisions? 0  Walking or climbing stairs? 0  Dressing or bathing? 0  Doing errands, shopping? 0  Preparing Food and eating ? N  Using the Toilet? N  In the past six months, have you accidently leaked  urine? N  Comment held too long  Do you have problems with loss of bowel control? N  Managing your Medications? N  Managing your Finances? N  Housekeeping or managing your Housekeeping? N    Patient Care Team: Dorothyann Peng, MD as PCP - General (Internal Medicine)  Indicate any recent Medical Services you may have received from other than [facility] providers in the past year (date may be approximate).     Assessment:   This is a routine wellness examination for [facility].  Hearing/Vision screen Hearing Screening - Comments:: Denies hearing issues Vision Screening - Comments:: Regular eye exams,   Dietary issues and exercise activities discussed:     Goals Addressed             This Visit's Progress    Patient Stated       11/28/2022, wants to lose weight and start exercising       Depression Screen    11/28/2022   11:29 AM 08/13/2022   10:00 AM 05/27/2022    9:02 AM 11/07/2021    2:17 PM 10/11/2020   12:01 PM 10/11/2020   11:45 AM 10/11/2019    4:40 PM  PHQ 2/9 Scores  PHQ - 2 Score 0 0 0 0 0 0 0  PHQ- 9 Score 0 0         Fall Risk    11/28/2022   11:29 AM 10/01/2022   11:59 AM 08/13/2022   10:00 AM 05/27/2022    9:02 AM 11/07/2021    2:17 PM  Fall Risk   Falls in the past year? 0 0 0 0 0  Number falls in past yr: 0 0 0 0 0  Injury with Fall? 0 0 0 0 0   Risk for fall due to : Medication side effect No Fall Risks No Fall Risks No Fall Risks Medication side effect  Follow up Falls prevention discussed;Falls evaluation completed Falls evaluation completed Falls evaluation completed Falls evaluation completed Falls evaluation completed;Education provided;Falls prevention discussed    MEDICARE RISK AT HOME:  Medicare Risk at Home - 11/28/22 1129     Any stairs in or around the home? Yes    If so, are there any without handrails? Yes    Home free of loose throw rugs in walkways, pet beds, electrical cords, etc? Yes    Adequate lighting in your home to reduce risk of falls? Yes    Life alert? No    Use of a cane, walker or w/c? No    Grab bars in the bathroom? No    Shower chair or bench in shower? Yes    Elevated toilet seat or a handicapped toilet? Yes             TIMED UP AND GO:  Was the test performed?  No    Cognitive Function:        11/28/2022   11:30 AM 11/07/2021    2:18 PM 10/11/2020   12:02 PM 10/07/2019   11:40 AM 10/06/2018   10:19 AM  6CIT Screen  What Year? 0 points 0 points 0 points 0 points 0 points  What month? 0 points 0 points 0 points 0 points 0 points  What time? 0 points 0 points 0 points 0 points 0 points  Count back from 20 0 points 0 points 0 points 0 points 0 points  Months in reverse 0 points 0 points 0 points 0 points 0 points  Repeat phrase 2 points 0 points  2 points 0 points 0 points  Total Score 2 points 0 points 2 points 0 points 0 points    Immunizations Immunization History  Administered Date(s) Administered   DTaP 10/19/2013   Fluad Quad(high Dose 65+) 01/25/2019, 03/20/2020   Influenza,inj,Quad PF,6+ Mos 06/23/2018   Influenza-Unspecified 01/09/2022   PFIZER(Purple Top)SARS-COV-2 Vaccination 05/18/2019, 06/10/2019, 01/19/2020, 12/27/2020   PNEUMOCOCCAL CONJUGATE-20 11/22/2021   Pneumococcal Polysaccharide-23 12/05/2014   Td 08/09/2005   Zoster Recombinant(Shingrix) 05/18/2021,  10/26/2021    TDAP status: Up to date  Flu Vaccine status: Due, Education has been provided regarding the importance of this vaccine. Advised may receive this vaccine at local pharmacy or Health Dept. Aware to provide a copy of the vaccination record if obtained from local pharmacy or Health Dept. Verbalized acceptance and understanding.  Pneumococcal vaccine status: Up to date  Covid-19 vaccine status: Information provided on how to obtain vaccines.   Qualifies for Shingles Vaccine? Yes   Zostavax completed Yes   Shingrix Completed?: Yes  Screening Tests Health Maintenance  Topic Date Due   COVID-19 Vaccine (5 - 2023-24 season) 12/21/2021   INFLUENZA VACCINE  11/21/2022   DTaP/Tdap/Td (3 - Tdap) 10/20/2023   Medicare Annual Wellness (AWV)  11/28/2023   MAMMOGRAM  05/17/2024   Colonoscopy  04/01/2028   Pneumonia Vaccine 38+ Years old  Completed   DEXA SCAN  Completed   Hepatitis C Screening  Completed   Zoster Vaccines- Shingrix  Completed   HPV VACCINES  Aged Out    Health Maintenance  Health Maintenance Due  Topic Date Due   COVID-19 Vaccine (5 - 2023-24 season) 12/21/2021   INFLUENZA VACCINE  11/21/2022    Colorectal cancer screening: Type of screening: Colonoscopy. Completed 04/01/2018. Repeat every 10 years  Mammogram status: Completed 05/17/2022. Repeat every year  Bone Density status: Completed 05/04/2021.   Lung Cancer Screening: (Low Dose CT Chest recommended if Age 19-80 years, 20 pack-year currently smoking OR have quit w/in 15years.) does not qualify.   Lung Cancer Screening Referral: no  Additional Screening:  Hepatitis C Screening: does qualify; Completed 08/24/2012  Vision Screening: Recommended annual ophthalmology exams for early detection of glaucoma and other disorders of the eye. Is the patient up to date with their annual eye exam?  Yes  Who is the provider or what is the name of the office in which the patient attends annual eye exams?  If pt  is not established with a provider, would they like to be referred to a provider to establish care? No .   Dental Screening: Recommended annual dental exams for proper oral hygiene  Diabetic Foot Exam: n/a  Community Resource Referral / Chronic Care Management: CRR required this visit?  No   CCM required this visit?  No     Plan:     I have personally reviewed and noted the following in the patient's chart:   Medical and social history Use of alcohol, tobacco or illicit drugs  Current medications and supplements including opioid prescriptions. Patient is not currently taking opioid prescriptions. Functional ability and status Nutritional status Physical activity Advanced directives List of other physicians Hospitalizations, surgeries, and ER visits in previous 12 months Vitals Screenings to include cognitive, depression, and falls Referrals and appointments  In addition, I have reviewed and discussed with patient certain preventive protocols, quality metrics, and best practice recommendations. A written personalized care plan for preventive services as well as general preventive health recommendations were provided to patient.     Reino Kent  Freida Busman, LPN   11/23/6960   After Visit Summary: (MyChart) Due to this being a telephonic visit, the after visit summary with patients personalized plan was offered to patient via MyChart   Nurse Notes: none

## 2022-12-05 ENCOUNTER — Ambulatory Visit (INDEPENDENT_AMBULATORY_CARE_PROVIDER_SITE_OTHER): Payer: Medicare PPO | Admitting: Internal Medicine

## 2022-12-05 ENCOUNTER — Encounter: Payer: Self-pay | Admitting: Internal Medicine

## 2022-12-05 VITALS — BP 110/80 | HR 61 | Temp 97.5°F | Ht 63.0 in | Wt 171.0 lb

## 2022-12-05 DIAGNOSIS — M8580 Other specified disorders of bone density and structure, unspecified site: Secondary | ICD-10-CM | POA: Diagnosis not present

## 2022-12-05 DIAGNOSIS — N182 Chronic kidney disease, stage 2 (mild): Secondary | ICD-10-CM | POA: Diagnosis not present

## 2022-12-05 DIAGNOSIS — Z0000 Encounter for general adult medical examination without abnormal findings: Secondary | ICD-10-CM

## 2022-12-05 DIAGNOSIS — E7800 Pure hypercholesterolemia, unspecified: Secondary | ICD-10-CM

## 2022-12-05 DIAGNOSIS — D696 Thrombocytopenia, unspecified: Secondary | ICD-10-CM | POA: Diagnosis not present

## 2022-12-05 DIAGNOSIS — R7309 Other abnormal glucose: Secondary | ICD-10-CM

## 2022-12-05 DIAGNOSIS — I129 Hypertensive chronic kidney disease with stage 1 through stage 4 chronic kidney disease, or unspecified chronic kidney disease: Secondary | ICD-10-CM

## 2022-12-05 LAB — POCT URINALYSIS DIPSTICK
Bilirubin, UA: NEGATIVE
Glucose, UA: NEGATIVE
Ketones, UA: NEGATIVE
Nitrite, UA: NEGATIVE
Protein, UA: NEGATIVE
Spec Grav, UA: 1.015 (ref 1.010–1.025)
Urobilinogen, UA: 0.2 E.U./dL
pH, UA: 7 (ref 5.0–8.0)

## 2022-12-05 NOTE — Patient Instructions (Signed)

## 2022-12-05 NOTE — Progress Notes (Signed)
I,[name], CMA,acting as a Neurosurgeon for [name], MD.,have documented all relevant documentation on the behalf of [name], MD,as directed by  [name], MD while in the presence of [name], MD.  Subjective:    Patient ID: Morgan Norman , female    DOB: 07/11/1955 , 67 y.o.   MRN: [id number]  Chief Complaint  Patient presents with   Annual Exam   Hypertension   Hyperlipidemia    HPI  She is here today for a full physical exam. She is no longer followed by GYN. She is s/p hysterectomy. She reports compliance with meds. She denies headaches, chest pain and shortness of breath. She has no specific concerns or complaints at this time.   Hypertension This is a chronic problem. The current episode started more than 1 year ago. The problem has been gradually improving since onset. The problem is controlled. Pertinent negatives include no blurred vision. The current treatment provides moderate improvement. There are no compliance problems.      Past Medical History:  Diagnosis Date   Benign hypertensive renal disease    CKD (chronic kidney disease) stage 2, GFR 60-89 ml/min    Hypertension    Migraines    Overweight    Pure hypercholesterolemia    Vitamin D deficiency      Family History  Problem Relation Age of Onset   Diabetes Mellitus I Mother    Bladder Cancer Mother    Dementia Mother    Alcoholism Father      Current Outpatient Medications:    alendronate (FOSAMAX) 70 MG tablet, Take 1 tablet (70 mg total) by mouth every 7 (seven) days. Take with a full glass of water on an empty stomach., Disp: 4 tablet, Rfl: 11   Cholecalciferol 5000 UNITS TABS, Take 5,000 Units by mouth., Disp: , Rfl:    cyclobenzaprine (FLEXERIL) 5 MG tablet, Take 1 tablet (5 mg total) by mouth daily as needed for muscle spasms., Disp: 30 tablet, Rfl: 0   ELDERBERRY PO, Take 1,000 mg by mouth., Disp: , Rfl:    loratadine (CLARITIN) 10 MG tablet, Take 10 mg  by mouth daily as needed for allergies., Disp: , Rfl:    meclizine (ANTIVERT) 25 MG tablet, Take 1 tablet (25 mg total) by mouth 3 (three) times daily as needed for dizziness., Disp: 30 tablet, Rfl: 0   meloxicam (MOBIC) 15 MG tablet, , Disp: , Rfl:    Multiple Vitamins-Minerals (IMMUNE SUPPORT PO), Take 1,000 mg by mouth daily at 6 (six) AM., Disp: , Rfl:    nystatin cream (MYCOSTATIN), Apply topically 3 (three) times daily., Disp: 30 g, Rfl: 1   Omega-3 Fatty Acids (FISH OIL PO), Take 120 mg by mouth., Disp: , Rfl:    rosuvastatin (CRESTOR) 10 MG tablet, TAKE 1 TABLET(10 MG) BY MOUTH DAILY, Disp: 90 tablet, Rfl: 2   scopolamine (TRANSDERM SCOP, 1.5 MG,) 1 MG/3DAYS, Place 1 patch (1.5 mg total) onto the skin every 3 (three) days., Disp: 4 patch, Rfl: 1   telmisartan (MICARDIS) 20 MG tablet, TAKE 1 TABLET(20 MG) BY MOUTH DAILY, Disp: 90 tablet, Rfl: 2   traMADol (ULTRAM) 50 MG tablet, Take 1 tablet (50 mg total) by mouth every 6 (six) hours as needed., Disp: 20 tablet, Rfl: 0   triamcinolone cream (KENALOG) 0.1 %, Apply 1 application. topically 2 (two) times daily., Disp: 30 g, Rfl: 1   Ginkgo Biloba (GINKOBA PO), Take by mouth. (Patient not taking:  Reported on 12/05/2022), Disp: , Rfl:    nirmatrelvir & ritonavir (PAXLOVID, 300/100,) 20 x 150 MG & 10 x 100MG  TBPK, Use as directed, take 3 capsules po twice daily (Patient not taking: Reported on 11/28/2022), Disp: 30 each, Rfl: 0   No Known Allergies    The patient states she uses post menopausal status for birth control. No LMP recorded. Patient has had a hysterectomy.. Negative for Dysmenorrhea. Negative for: breast discharge, breast lump(s), breast pain and breast self exam. Associated symptoms include abnormal vaginal bleeding. Pertinent negatives include abnormal bleeding (hematology), anxiety, decreased libido, depression, difficulty falling sleep, dyspareunia, history of infertility, nocturia, sexual dysfunction, sleep disturbances, urinary  incontinence, urinary urgency, vaginal discharge and vaginal itching. Diet regular.The patient states her exercise level is  INTERMITTENT.  . The patient's tobacco use is:  Social History   Tobacco Use  Smoking Status Never  Smokeless Tobacco Never  . She has been exposed to passive smoke. The patient's alcohol use is:  Social History   Substance and Sexual Activity  Alcohol Use Not Currently   Alcohol/week: 0.0 standard drinks of alcohol   Comment: rarely   Review of Systems  Constitutional: Negative.   HENT: Negative.    Eyes: Negative.  Negative for blurred vision.  Respiratory: Negative.    Cardiovascular: Negative.   Gastrointestinal: Negative.   Endocrine: Negative.   Genitourinary: Negative.   Musculoskeletal: Negative.   Skin: Negative.   Allergic/Immunologic: Negative.   Neurological: Negative.   Hematological: Negative.   Psychiatric/Behavioral: Negative.       Today's Vitals   12/05/22 0903  BP: 110/80  Pulse: 61  Temp: (!) 97.5 F (36.4 C)  SpO2: 98%  Weight: 171 lb (77.6 kg)  Height: 5\' 3"  (1.6 m)  PainSc: 0-No pain   Body mass index is 30.29 kg/m.  Wt Readings from Last 3 Encounters:  12/05/22 171 lb (77.6 kg)  10/01/22 170 lb 6.4 oz (77.3 kg)  08/13/22 172 lb 6.4 oz (78.2 kg)     Objective:  Physical Exam Vitals and nursing note reviewed.  Constitutional:      Appearance: Normal appearance.  HENT:     Head: Normocephalic and atraumatic.     Right Ear: Tympanic membrane, ear canal and external ear normal.     Left Ear: Tympanic membrane, ear canal and external ear normal.     Nose: Nose normal.     Mouth/Throat:     Mouth: Mucous membranes are moist.     Pharynx: Oropharynx is clear.  Eyes:     Extraocular Movements: Extraocular movements intact.     Conjunctiva/sclera: Conjunctivae normal.     Pupils: Pupils are equal, round, and reactive to light.  Cardiovascular:     Rate and Rhythm: Normal rate and regular rhythm.     Pulses:  Normal pulses.     Heart sounds: Normal heart sounds.  Pulmonary:     Effort: Pulmonary effort is normal.     Breath sounds: Normal breath sounds.  Chest:  Breasts:    Tanner Score is 5.     Right: Normal.     Left: Normal.  Abdominal:     General: Abdomen is flat. Bowel sounds are normal.     Palpations: Abdomen is soft.  Genitourinary:    Comments: deferred Musculoskeletal:        General: Normal range of motion.     Cervical back: Normal range of motion and neck supple.  Skin:    General: Skin is  warm and dry.  Neurological:     General: No focal deficit present.     Mental Status: She is alert and oriented to person, place, and time.  Psychiatric:        Mood and Affect: Mood normal.        Behavior: Behavior normal.         Assessment And Plan:     Encounter for general adult medical examination w/o abnormal findings Assessment & Plan: A full exam was performed.  Importance of monthly self breast exams was discussed with the patient.  She is advised to get 30-45 minutes of regular exercise, no less than four to five days per week. Both weight-bearing and aerobic exercises are recommended.  She is advised to follow a healthy diet with at least six fruits/veggies per day, decrease intake of red meat and other saturated fats and to increase fish intake to twice weekly.  Meats/fish should not be fried -- baked, boiled or broiled is preferable. It is also important to cut back on your sugar intake.  Be sure to read labels - try to avoid anything with added sugar, high fructose corn syrup or other sweeteners.  If you must use a sweetener, you can try stevia or monkfruit.  It is also important to avoid artificially sweetened foods/beverages and diet drinks. Lastly, wear SPF 50 sunscreen on exposed skin and when in direct sunlight for an extended period of time.  Be sure to avoid fast food restaurants and aim for at least 60 ounces of water daily.       Hypertensive  nephropathy Assessment & Plan: Chronic, controlled.  EKG performed, SB w/o acute changes. She will continue with telmisartan 20mg  daily. She is encouraged to follow low sodium diet. She will f/u in six months for re-evaluation.   Orders: -     POCT urinalysis dipstick -     Microalbumin / creatinine urine ratio -     EKG 12-Lead -     CBC -     CMP14+EGFR -     Lipid panel -     TSH  Chronic renal disease, stage II Assessment & Plan: Chronic, she is encouraged to keep BP well controlled and to stay well hydrated to decrease risk of CKD progression.    Pure hypercholesterolemia Assessment & Plan: Chronic, she will continue with rosuvastatin 10mg  daily. She is encouraged to follow heart healthy lifestyle - aiming for at least 150 minutes of exercise/week.   Orders: -     Lipid panel -     TSH  Other abnormal glucose Assessment & Plan: Previous labs reviewed, her A1c has been elevated in the past. I will check an A1c today. Reminded to avoid refined sugars including sugary drinks/foods and processed meats including bacon, sausages and deli meats.     Osteopenia, unspecified location Assessment & Plan: She was initially referred to [facility] Rheumatology; however, after review, they prefer her to be seen at Osteoporosis clinic. Referral is pending. They have acknowledged receipt of her records.    Thrombocytopenia (HCC) Assessment & Plan: I will check CBC today.       Return for 1 year HM, 6 month bp. Patient was given opportunity to ask questions. Patient verbalized understanding of the plan and was able to repeat key elements of the plan. All questions were answered to their satisfaction.    I, [name], MD, have reviewed all documentation for this visit. The documentation on 12/05/22 for the exam,  diagnosis, procedures, and orders are all accurate and complete.

## 2022-12-06 LAB — CBC
Hematocrit: 38.7 % (ref 34.0–46.6)
Hemoglobin: 12.5 g/dL (ref 11.1–15.9)
MCH: 29.4 pg (ref 26.6–33.0)
MCHC: 32.3 g/dL (ref 31.5–35.7)
MCV: 91 fL (ref 79–97)
Platelets: 109 10*3/uL — ABNORMAL LOW (ref 150–450)
RBC: 4.25 x10E6/uL (ref 3.77–5.28)
RDW: 12.3 % (ref 11.7–15.4)
WBC: 3.9 10*3/uL (ref 3.4–10.8)

## 2022-12-06 LAB — CMP14+EGFR
ALT: 9 IU/L (ref 0–32)
AST: 21 IU/L (ref 0–40)
Albumin: 4.3 g/dL (ref 3.9–4.9)
Alkaline Phosphatase: 70 IU/L (ref 44–121)
BUN/Creatinine Ratio: 12 (ref 12–28)
BUN: 9 mg/dL (ref 8–27)
Bilirubin Total: 0.4 mg/dL (ref 0.0–1.2)
CO2: 24 mmol/L (ref 20–29)
Calcium: 8.7 mg/dL (ref 8.7–10.3)
Chloride: 103 mmol/L (ref 96–106)
Creatinine, Ser: 0.77 mg/dL (ref 0.57–1.00)
Globulin, Total: 2.4 g/dL (ref 1.5–4.5)
Glucose: 84 mg/dL (ref 70–99)
Potassium: 4.5 mmol/L (ref 3.5–5.2)
Sodium: 141 mmol/L (ref 134–144)
Total Protein: 6.7 g/dL (ref 6.0–8.5)
eGFR: 83 mL/min/{1.73_m2} (ref >59)

## 2022-12-06 LAB — MICROALBUMIN / CREATININE URINE RATIO
Creatinine, Urine: 56.7 mg/dL
Microalb/Creat Ratio: <5 mg/g{creat} (ref 0–29)
Microalbumin, Urine: <3 ug/mL

## 2022-12-06 LAB — LIPID PANEL
Chol/HDL Ratio: 2.6 ratio (ref 0.0–4.4)
Cholesterol, Total: 145 mg/dL (ref 100–199)
HDL: 56 mg/dL (ref >39)
LDL Chol Calc (NIH): 79 mg/dL (ref 0–99)
Triglycerides: 44 mg/dL (ref 0–149)
VLDL Cholesterol Cal: 10 mg/dL (ref 5–40)

## 2022-12-06 LAB — TSH: TSH: 1.42 u[IU]/mL (ref 0.450–4.500)

## 2022-12-15 NOTE — Assessment & Plan Note (Signed)
I will check CBC today.

## 2022-12-15 NOTE — Assessment & Plan Note (Signed)
Chronic, controlled.  EKG performed, SB w/o acute changes. She will continue with telmisartan 20mg  daily. She is encouraged to follow low sodium diet. She will f/u in six months for re-evaluation.

## 2022-12-15 NOTE — Assessment & Plan Note (Signed)
Chronic, she is encouraged to keep BP well controlled and to stay well hydrated to decrease risk of CKD progression.

## 2022-12-15 NOTE — Assessment & Plan Note (Signed)
Chronic, she will continue with rosuvastatin 10mg  daily. She is encouraged to follow heart healthy lifestyle - aiming for at least 150 minutes of exercise/week.

## 2022-12-15 NOTE — Assessment & Plan Note (Signed)
She was initially referred to [facility] Rheumatology; however, after review, they prefer her to be seen at Osteoporosis clinic. Referral is pending. They have acknowledged receipt of her records.

## 2022-12-15 NOTE — Assessment & Plan Note (Signed)

## 2022-12-15 NOTE — Assessment & Plan Note (Signed)
Previous labs reviewed, her A1c has been elevated in the past. I will check an A1c today. Reminded to avoid refined sugars including sugary drinks/foods and processed meats including bacon, sausages and deli meats.  

## 2023-01-27 ENCOUNTER — Encounter: Payer: Self-pay | Admitting: Internal Medicine

## 2023-02-04 DIAGNOSIS — Z79899 Other long term (current) drug therapy: Secondary | ICD-10-CM | POA: Diagnosis not present

## 2023-02-04 DIAGNOSIS — Z8781 Personal history of (healed) traumatic fracture: Secondary | ICD-10-CM | POA: Diagnosis not present

## 2023-02-04 DIAGNOSIS — Z1321 Encounter for screening for nutritional disorder: Secondary | ICD-10-CM | POA: Diagnosis not present

## 2023-02-04 DIAGNOSIS — M810 Age-related osteoporosis without current pathological fracture: Secondary | ICD-10-CM | POA: Diagnosis not present

## 2023-02-04 DIAGNOSIS — Z780 Asymptomatic menopausal state: Secondary | ICD-10-CM | POA: Diagnosis not present

## 2023-03-04 DIAGNOSIS — M810 Age-related osteoporosis without current pathological fracture: Secondary | ICD-10-CM | POA: Diagnosis not present

## 2023-03-05 DIAGNOSIS — I10 Essential (primary) hypertension: Secondary | ICD-10-CM | POA: Diagnosis not present

## 2023-03-05 DIAGNOSIS — R1013 Epigastric pain: Secondary | ICD-10-CM | POA: Diagnosis not present

## 2023-03-05 DIAGNOSIS — Z20822 Contact with and (suspected) exposure to covid-19: Secondary | ICD-10-CM | POA: Diagnosis not present

## 2023-03-05 DIAGNOSIS — R112 Nausea with vomiting, unspecified: Secondary | ICD-10-CM | POA: Diagnosis not present

## 2023-03-05 DIAGNOSIS — R000 Tachycardia, unspecified: Secondary | ICD-10-CM | POA: Diagnosis not present

## 2023-03-05 DIAGNOSIS — M810 Age-related osteoporosis without current pathological fracture: Secondary | ICD-10-CM | POA: Diagnosis not present

## 2023-03-05 DIAGNOSIS — R509 Fever, unspecified: Secondary | ICD-10-CM | POA: Diagnosis not present

## 2023-03-05 DIAGNOSIS — T50995A Adverse effect of other drugs, medicaments and biological substances, initial encounter: Secondary | ICD-10-CM | POA: Diagnosis not present

## 2023-03-05 DIAGNOSIS — M791 Myalgia, unspecified site: Secondary | ICD-10-CM | POA: Diagnosis not present

## 2023-03-11 ENCOUNTER — Ambulatory Visit: Payer: Medicare PPO | Admitting: Internal Medicine

## 2023-03-11 VITALS — BP 124/78 | HR 74 | Temp 98.2°F | Ht 63.0 in | Wt 170.0 lb

## 2023-03-11 DIAGNOSIS — T50905A Adverse effect of unspecified drugs, medicaments and biological substances, initial encounter: Secondary | ICD-10-CM | POA: Insufficient documentation

## 2023-03-11 DIAGNOSIS — M85851 Other specified disorders of bone density and structure, right thigh: Secondary | ICD-10-CM | POA: Diagnosis not present

## 2023-03-11 DIAGNOSIS — E876 Hypokalemia: Secondary | ICD-10-CM

## 2023-03-11 DIAGNOSIS — R1013 Epigastric pain: Secondary | ICD-10-CM | POA: Diagnosis not present

## 2023-03-11 DIAGNOSIS — T50905D Adverse effect of unspecified drugs, medicaments and biological substances, subsequent encounter: Secondary | ICD-10-CM | POA: Diagnosis not present

## 2023-03-11 NOTE — Progress Notes (Signed)
I,[name], CMA,acting as a Neurosurgeon for [name], MD.,have documented all relevant documentation on the behalf of [name], MD,as directed by  [name], MD while in the presence of [name], MD.  Subjective:  Patient ID: Morgan Norman , female    DOB: [birth year] , 67 y.o.   MRN: [id number]  Chief Complaint  Patient presents with   hospital f/u    HPI  Patient presents today for a ER f/u. She went to [facility] ER on 03/05/23 for further evaluation of n/v and chest discomfort.  Her sx started in the middle of the night.  She awakened about 2-3am with chest discomfort. She took a Tums, and then threw up.  The day prior to her ER evaluation, she went to the infusion center and she had a medication for osteoporosis given IV for the first time (Reclast), prescribed by [facility] Rheumatology. She states that later that night she began experiencing bodyaches and chills along with nausea and vomiting, no known sick exposures. ER workup negative for cardiac issues, nl EKG. She was discharged home on Zofran.   Today, she reports she feels weak and has no energy.      Past Medical History:  Diagnosis Date   Benign hypertensive renal disease    CKD (chronic kidney disease) stage 2, GFR 60-89 ml/min    Hypertension    Migraines    Overweight    Pure hypercholesterolemia    Vitamin D deficiency      Family History  Problem Relation Age of Onset   Diabetes Mellitus I Mother    Bladder Cancer Mother    Dementia Mother    Alcoholism Father      Current Outpatient Medications:    Cholecalciferol 5000 UNITS TABS, Take 5,000 Units by mouth., Disp: , Rfl:    cyclobenzaprine (FLEXERIL) 5 MG tablet, Take 1 tablet (5 mg total) by mouth daily as needed for muscle spasms., Disp: 30 tablet, Rfl: 0   ELDERBERRY PO, Take 1,000 mg by mouth., Disp: , Rfl:    loratadine (CLARITIN) 10 MG tablet, Take 10 mg by mouth daily as needed for allergies., Disp: , Rfl:     meclizine (ANTIVERT) 25 MG tablet, Take 1 tablet (25 mg total) by mouth 3 (three) times daily as needed for dizziness., Disp: 30 tablet, Rfl: 0   meloxicam (MOBIC) 15 MG tablet, , Disp: , Rfl:    Multiple Vitamins-Minerals (IMMUNE SUPPORT PO), Take 1,000 mg by mouth daily at 6 (six) AM., Disp: , Rfl:    nystatin cream (MYCOSTATIN), Apply topically 3 (three) times daily., Disp: 30 g, Rfl: 1   Omega-3 Fatty Acids (FISH OIL PO), Take 120 mg by mouth., Disp: , Rfl:    rosuvastatin (CRESTOR) 10 MG tablet, TAKE 1 TABLET(10 MG) BY MOUTH DAILY, Disp: 90 tablet, Rfl: 2   telmisartan (MICARDIS) 20 MG tablet, TAKE 1 TABLET(20 MG) BY MOUTH DAILY, Disp: 90 tablet, Rfl: 2   traMADol (ULTRAM) 50 MG tablet, Take 1 tablet (50 mg total) by mouth every 6 (six) hours as needed., Disp: 20 tablet, Rfl: 0   triamcinolone cream (KENALOG) 0.1 %, Apply 1 application. topically 2 (two) times daily., Disp: 30 g, Rfl: 1   pantoprazole (PROTONIX) 20 MG tablet, TAKE 1 TABLET(20 MG) BY MOUTH DAILY, Disp: 90 tablet, Rfl: 2   No Known Allergies   Review of Systems  Constitutional:  Positive for fatigue.  HENT: Negative.    Respiratory: Negative.  Cardiovascular: Negative.   Gastrointestinal:  Positive for nausea.     Today's Vitals   03/11/23 0944 03/11/23 1022  BP: (!) 140/80 124/78  Pulse: 74   Temp: 98.2 F (36.8 C)   Weight: 170 lb (77.1 kg)   Height: 5\' 3"  (1.6 m)   PainSc: 0-No pain    Body mass index is 30.11 kg/m.  Wt Readings from Last 3 Encounters:  03/11/23 170 lb (77.1 kg)  12/05/22 171 lb (77.6 kg)  10/01/22 170 lb 6.4 oz (77.3 kg)    The 10-year ASCVD risk score (Arnett DK, et al., 2019) is: 8%   Values used to calculate the score:     Age: 2 years     Sex: Female     Is Non-Hispanic African American: Yes     Diabetic: No     Tobacco smoker: No     Systolic Blood Pressure: 124 mmHg     Is BP treated: Yes     HDL Cholesterol: 56 mg/dL     Total Cholesterol: 145 mg/dL  Objective:   Physical Exam Vitals and nursing note reviewed.  Constitutional:      Appearance: Normal appearance.  HENT:     Head: Normocephalic and atraumatic.  Eyes:     Extraocular Movements: Extraocular movements intact.  Cardiovascular:     Rate and Rhythm: Normal rate and regular rhythm.     Heart sounds: Normal heart sounds.  Pulmonary:     Effort: Pulmonary effort is normal.     Breath sounds: Normal breath sounds.  Musculoskeletal:     Cervical back: Normal range of motion.  Skin:    General: Skin is warm.  Neurological:     General: No focal deficit present.     Mental Status: She is alert.  Psychiatric:        Mood and Affect: Mood normal.        Behavior: Behavior normal.         Assessment And Plan:  Adverse effect of drug, subsequent encounter Assessment & Plan: ER records reviewed in full detail. It appears she has intolerance to Reclast. She is encouraged to try to hydrate and eat small meals to avoid exacerbating her nausea. She should continue with Zofran as needed.    Dyspepsia Assessment & Plan: I will send rx pantoprazole 20mg  to take once daily. Hopefully, she will not need this medication for more than 30 days.    Hypokalemia Assessment & Plan: Her potassium was low in ER, will recheck today. Will replete as needed.   Orders: -     BMP8+EGFR  Osteopenia of neck of right femur Assessment & Plan: Most recent dexa from 2023 shows osteopenia. [facility] notes reviewed in full detail.      Return if symptoms worsen or fail to improve.  Patient was given opportunity to ask questions. Patient verbalized understanding of the plan and was able to repeat key elements of the plan. All questions were answered to their satisfaction.    I, [name], MD, have reviewed all documentation for this visit. The documentation on 03/11/23 for the exam, diagnosis, procedures, and orders are all accurate and complete.   IF YOU HAVE BEEN REFERRED TO A  SPECIALIST, IT MAY TAKE 1-2 WEEKS TO SCHEDULE/PROCESS THE REFERRAL. IF YOU HAVE NOT HEARD FROM US/SPECIALIST IN TWO WEEKS, PLEASE GIVE [place] A CALL AT [phone].

## 2023-03-12 ENCOUNTER — Other Ambulatory Visit: Payer: Self-pay | Admitting: Internal Medicine

## 2023-03-12 ENCOUNTER — Encounter: Payer: Self-pay | Admitting: Internal Medicine

## 2023-03-12 LAB — BMP8+EGFR
BUN/Creatinine Ratio: 12 (ref 12–28)
BUN: 9 mg/dL (ref 8–27)
CO2: 26 mmol/L (ref 20–29)
Calcium: 8.8 mg/dL (ref 8.7–10.3)
Chloride: 101 mmol/L (ref 96–106)
Creatinine, Ser: 0.76 mg/dL (ref 0.57–1.00)
Glucose: 75 mg/dL (ref 70–99)
Potassium: 4 mmol/L (ref 3.5–5.2)
Sodium: 140 mmol/L (ref 134–144)
eGFR: 85 mL/min/{1.73_m2} (ref >59)

## 2023-03-12 MED ORDER — PANTOPRAZOLE SODIUM 20 MG PO TBEC: 20.0000 mg | DELAYED_RELEASE_TABLET | Freq: Every day | ORAL | 1 refills | Status: DC

## 2023-03-21 NOTE — Assessment & Plan Note (Signed)
Her potassium was low in ER, will recheck today. Will replete as needed.

## 2023-03-21 NOTE — Assessment & Plan Note (Signed)
ER records reviewed in full detail. It appears she has intolerance to Reclast. She is encouraged to try to hydrate and eat small meals to avoid exacerbating her nausea. She should continue with Zofran as needed.

## 2023-03-21 NOTE — Assessment & Plan Note (Signed)
Most recent dexa from 2023 shows osteopenia. [facility] notes reviewed in full detail.

## 2023-03-21 NOTE — Assessment & Plan Note (Signed)
I will send rx pantoprazole 20mg  to take once daily. Hopefully, she will not need this medication for more than 30 days.

## 2023-04-24 DIAGNOSIS — J069 Acute upper respiratory infection, unspecified: Secondary | ICD-10-CM | POA: Diagnosis not present

## 2023-05-01 ENCOUNTER — Ambulatory Visit (INDEPENDENT_AMBULATORY_CARE_PROVIDER_SITE_OTHER): Payer: Medicare PPO | Admitting: Family Medicine

## 2023-05-01 VITALS — BP 110/80 | HR 81 | Temp 98.1°F | Ht 63.0 in | Wt 171.0 lb

## 2023-05-01 DIAGNOSIS — K626 Ulcer of anus and rectum: Secondary | ICD-10-CM

## 2023-05-01 MED ORDER — HYDROCORTISONE ACE-PRAMOXINE 2.5-1 % EX CREA: 1.0000 | TOPICAL_CREAM | Freq: Two times a day (BID) | CUTANEOUS | 0 refills | Status: AC | PRN

## 2023-05-01 NOTE — Progress Notes (Signed)
 I,[name], CMA,acting as a neurosurgeon for Morgan Lynch, NP.,have documented all relevant documentation on the behalf of Morgan Creighton, NP,as directed by  Morgan Creighton, NP while in the presence of Morgan Creighton, NP.  Subjective:  Patient ID: Morgan Norman , female    DOB: 1956/01/14 , 68 y.o.   MRN: [id number]  Chief Complaint  Patient presents with   Follow-up    HPI  Patient is a 68 year old female who presents today for follow up after her urgent care visit on 04/24/2023 . Patient reports she was diagnosed with a upper respiratory infection and she feels better now , she was prescribed  a nasal spray and a Dosepak of 5 mg prednisone.  Patient also states that she has a sore in her anal area that she would like to be checked out. She states that some weeks ago, it happened and went away and occurred again without any injury. Will culture ulcer for testing.     Past Medical History:  Diagnosis Date   Benign hypertensive renal disease    CKD (chronic kidney disease) stage 2, GFR 60-89 ml/min    Hypertension    Migraines    Overweight    Pure hypercholesterolemia    Vitamin D deficiency      Family History  Problem Relation Age of Onset   Diabetes Mellitus I Mother    Bladder Cancer Mother    Dementia Mother    Alcoholism Father      Current Outpatient Medications:    Cholecalciferol 5000 UNITS TABS, Take 5,000 Units by mouth., Disp: , Rfl:    cyclobenzaprine  (FLEXERIL ) 5 MG tablet, Take 1 tablet (5 mg total) by mouth daily as needed for muscle spasms., Disp: 30 tablet, Rfl: 0   ELDERBERRY PO, Take 1,000 mg by mouth., Disp: , Rfl:    loratadine (CLARITIN) 10 MG tablet, Take 10 mg by mouth daily as needed for allergies., Disp: , Rfl:    meclizine  (ANTIVERT ) 25 MG tablet, Take 1 tablet (25 mg total) by mouth 3 (three) times daily as needed for dizziness., Disp: 30 tablet, Rfl: 0   meloxicam (MOBIC) 15 MG tablet, , Disp: , Rfl:    Multiple Vitamins-Minerals (IMMUNE  SUPPORT PO), Take 1,000 mg by mouth daily at 6 (six) AM., Disp: , Rfl:    nystatin  cream (MYCOSTATIN ), Apply topically 3 (three) times daily., Disp: 30 g, Rfl: 1   Omega-3 Fatty Acids (FISH OIL PO), Take 120 mg by mouth., Disp: , Rfl:    pantoprazole  (PROTONIX ) 20 MG tablet, TAKE 1 TABLET(20 MG) BY MOUTH DAILY, Disp: 90 tablet, Rfl: 2   Pramoxine-HC (HYDROCORTISONE  ACE-PRAMOXINE) 2.5-1 % CREA, Apply 1 Application topically 2 (two) times daily as needed., Disp: 28.4 g, Rfl: 0   rosuvastatin  (CRESTOR ) 10 MG tablet, TAKE 1 TABLET(10 MG) BY MOUTH DAILY, Disp: 90 tablet, Rfl: 2   telmisartan  (MICARDIS ) 20 MG tablet, TAKE 1 TABLET(20 MG) BY MOUTH DAILY, Disp: 90 tablet, Rfl: 2   traMADol  (ULTRAM ) 50 MG tablet, Take 1 tablet (50 mg total) by mouth every 6 (six) hours as needed., Disp: 20 tablet, Rfl: 0   triamcinolone  cream (KENALOG ) 0.1 %, Apply 1 application. topically 2 (two) times daily., Disp: 30 g, Rfl: 1   No Known Allergies   Review of Systems  Constitutional: Negative.   HENT:  Positive for postnasal drip.   Eyes: Negative.   Respiratory: Negative.    Cardiovascular: Negative.   Genitourinary:  Positive for genital sores.  Musculoskeletal: Negative.   Skin: Negative.   Psychiatric/Behavioral: Negative.       Today's Vitals   05/01/23 1537  BP: 110/80  Pulse: 81  Temp: 98.1 F (36.7 C)  TempSrc: Oral  Weight: 171 lb (77.6 kg)  Height: 5' 3 (1.6 m)  PainSc: 5   PainLoc: Buttocks   Body mass index is 30.29 kg/m.  Wt Readings from Last 3 Encounters:  05/01/23 171 lb (77.6 kg)  03/11/23 170 lb (77.1 kg)  12/05/22 171 lb (77.6 kg)    The 10-year ASCVD risk score (Arnett DK, et al., 2019) is: 6.3%   Values used to calculate the score:     Age: 14 years     Sex: Female     Is Non-Hispanic African American: Yes     Diabetic: No     Tobacco smoker: No     Systolic Blood Pressure: 110 mmHg     Is BP treated: Yes     HDL Cholesterol: 56 mg/dL     Total Cholesterol: 145  mg/dL  Objective:  Physical Exam Exam conducted with a chaperone present.  Constitutional:      General: She is awake.  HENT:     Head: Normocephalic.  Genitourinary:    Exam position: Supine.     Comments: Dime sized ulcer near the anus.  Skin:    General: Skin is warm and dry.  Neurological:     Mental Status: She is alert.         Assessment And Plan:  Rectal/anal ulcer -     Anaerobic and Aerobic Culture -     Herpes simplex virus culture -     Hydrocortisone  Ace-Pramoxine; Apply 1 Application topically 2 (two) times daily as needed.  Dispense: 28.4 g; Refill: 0    Return if symptoms worsen or fail to improve, for Keep next scheduled appt.  Patient was given opportunity to ask questions. Patient verbalized understanding of the plan and was able to repeat key elements of the plan. All questions were answered to their satisfaction.    I, Morgan Creighton, NP, have reviewed all documentation for this visit. The documentation on 05/06/2023 for the exam, diagnosis, procedures, and orders are all accurate and complete.   IF YOU HAVE BEEN REFERRED TO A SPECIALIST, IT MAY TAKE 1-2 WEEKS TO SCHEDULE/PROCESS THE REFERRAL. IF YOU HAVE NOT HEARD FROM US /SPECIALIST IN TWO WEEKS, PLEASE GIVE US  A CALL AT [phone].

## 2023-05-04 LAB — HERPES SIMPLEX VIRUS CULTURE

## 2023-05-06 ENCOUNTER — Encounter: Payer: Self-pay | Admitting: Family Medicine

## 2023-05-07 ENCOUNTER — Other Ambulatory Visit: Payer: Self-pay | Admitting: Family Medicine

## 2023-05-07 DIAGNOSIS — B009 Herpesviral infection, unspecified: Secondary | ICD-10-CM

## 2023-05-07 LAB — ANAEROBIC AND AEROBIC CULTURE

## 2023-05-07 MED ORDER — VALACYCLOVIR HCL 1 G PO TABS: 1000.0000 mg | ORAL_TABLET | Freq: Two times a day (BID) | ORAL | 0 refills | Status: AC

## 2023-05-28 DIAGNOSIS — M62838 Other muscle spasm: Secondary | ICD-10-CM | POA: Diagnosis not present

## 2023-05-28 DIAGNOSIS — M545 Low back pain, unspecified: Secondary | ICD-10-CM | POA: Diagnosis not present

## 2023-06-10 ENCOUNTER — Ambulatory Visit: Payer: Medicare PPO | Admitting: Internal Medicine

## 2023-07-03 ENCOUNTER — Other Ambulatory Visit: Payer: Self-pay | Admitting: Internal Medicine

## 2023-07-03 DIAGNOSIS — Z1231 Encounter for screening mammogram for malignant neoplasm of breast: Secondary | ICD-10-CM

## 2023-07-09 ENCOUNTER — Ambulatory Visit

## 2023-07-09 DIAGNOSIS — Z1231 Encounter for screening mammogram for malignant neoplasm of breast: Secondary | ICD-10-CM

## 2023-07-10 ENCOUNTER — Ambulatory Visit (INDEPENDENT_AMBULATORY_CARE_PROVIDER_SITE_OTHER): Payer: Medicare PPO | Admitting: Internal Medicine

## 2023-07-10 ENCOUNTER — Encounter: Payer: Self-pay | Admitting: Internal Medicine

## 2023-07-10 VITALS — BP 124/78 | HR 71 | Temp 98.4°F | Ht 63.0 in | Wt 175.0 lb

## 2023-07-10 DIAGNOSIS — E6609 Other obesity due to excess calories: Secondary | ICD-10-CM

## 2023-07-10 DIAGNOSIS — N182 Chronic kidney disease, stage 2 (mild): Secondary | ICD-10-CM

## 2023-07-10 DIAGNOSIS — R7309 Other abnormal glucose: Secondary | ICD-10-CM | POA: Diagnosis not present

## 2023-07-10 DIAGNOSIS — F4321 Adjustment disorder with depressed mood: Secondary | ICD-10-CM | POA: Diagnosis not present

## 2023-07-10 DIAGNOSIS — I129 Hypertensive chronic kidney disease with stage 1 through stage 4 chronic kidney disease, or unspecified chronic kidney disease: Secondary | ICD-10-CM

## 2023-07-10 DIAGNOSIS — Z8781 Personal history of (healed) traumatic fracture: Secondary | ICD-10-CM

## 2023-07-10 DIAGNOSIS — Z8619 Personal history of other infectious and parasitic diseases: Secondary | ICD-10-CM

## 2023-07-10 DIAGNOSIS — E66811 Obesity, class 1: Secondary | ICD-10-CM

## 2023-07-10 DIAGNOSIS — G8929 Other chronic pain: Secondary | ICD-10-CM

## 2023-07-10 DIAGNOSIS — E7800 Pure hypercholesterolemia, unspecified: Secondary | ICD-10-CM

## 2023-07-10 DIAGNOSIS — Z6831 Body mass index (BMI) 31.0-31.9, adult: Secondary | ICD-10-CM

## 2023-07-10 DIAGNOSIS — M545 Low back pain, unspecified: Secondary | ICD-10-CM

## 2023-07-10 MED ORDER — VALACYCLOVIR HCL 500 MG PO TABS: 500.0000 mg | ORAL_TABLET | Freq: Every day | ORAL | 1 refills | Status: DC

## 2023-07-10 MED ORDER — BACLOFEN 10 MG PO TABS: 10.0000 mg | ORAL_TABLET | Freq: Every evening | ORAL | 1 refills | Status: AC | PRN

## 2023-07-10 NOTE — Patient Instructions (Signed)
 Hypertension, Adult Hypertension is another name for high blood pressure. High blood pressure forces your heart to work harder to pump blood. This can cause problems over time. There are two numbers in a blood pressure reading. There is a top number (systolic) over a bottom number (diastolic). It is best to have a blood pressure that is below 120/80. What are the causes? The cause of this condition is not known. Some other conditions can lead to high blood pressure. What increases the risk? Some lifestyle factors can make you more likely to develop high blood pressure: Smoking. Not getting enough exercise or physical activity. Being overweight. Having too much fat, sugar, calories, or salt (sodium) in your diet. Drinking too much alcohol. Other risk factors include: Having any of these conditions: Heart disease. Diabetes. High cholesterol. Kidney disease. Obstructive sleep apnea. Having a family history of high blood pressure and high cholesterol. Age. The risk increases with age. Stress. What are the signs or symptoms? High blood pressure may not cause symptoms. Very high blood pressure (hypertensive crisis) may cause: Headache. Fast or uneven heartbeats (palpitations). Shortness of breath. Nosebleed. Vomiting or feeling like you may vomit (nauseous). Changes in how you see. Very bad chest pain. Feeling dizzy. Seizures. How is this treated? This condition is treated by making healthy lifestyle changes, such as: Eating healthy foods. Exercising more. Drinking less alcohol. Your doctor may prescribe medicine if lifestyle changes do not help enough and if: Your top number is above 130. Your bottom number is above 80. Your personal target blood pressure may vary. Follow these instructions at home: Eating and drinking  If told, follow the DASH eating plan. To follow this plan: Fill one half of your plate at each meal with fruits and vegetables. Fill one fourth of your plate  at each meal with whole grains. Whole grains include whole-wheat pasta, brown rice, and whole-grain bread. Eat or drink low-fat dairy products, such as skim milk or low-fat yogurt. Fill one fourth of your plate at each meal with low-fat (lean) proteins. Low-fat proteins include fish, chicken without skin, eggs, beans, and tofu. Avoid fatty meat, cured and processed meat, or chicken with skin. Avoid pre-made or processed food. Limit the amount of salt in your diet to less than 1,500 mg each day. Do not drink alcohol if: Your doctor tells you not to drink. You are pregnant, may be pregnant, or are planning to become pregnant. If you drink alcohol: Limit how much you have to: 0-1 drink a day for women. 0-2 drinks a day for men. Know how much alcohol is in your drink. In the [place] one drink equals one 12 oz bottle of beer (355 mL), one 5 oz glass of wine (148 mL), or one 1 oz glass of hard liquor (44 mL). Lifestyle  Work with your doctor to stay at a healthy weight or to lose weight. Ask your doctor what the best weight is for you. Get at least 30 minutes of exercise that causes your heart to beat faster (aerobic exercise) most days of the week. This may include walking, swimming, or biking. Get at least 30 minutes of exercise that strengthens your muscles (resistance exercise) at least 3 days a week. This may include lifting weights or doing Pilates. Do not smoke or use any products that contain nicotine or tobacco. If you need help quitting, ask your doctor. Check your blood pressure at home as told by your doctor. Keep all follow-up visits. Medicines Take over-the-counter and prescription medicines  only as told by your doctor. Follow directions carefully. Do not skip doses of blood pressure medicine. The medicine does not work as well if you skip doses. Skipping doses also puts you at risk for problems. Ask your doctor about side effects or reactions to medicines that you should watch  for. Contact a doctor if: You think you are having a reaction to the medicine you are taking. You have headaches that keep coming back. You feel dizzy. You have swelling in your ankles. You have trouble with your vision. Get help right away if: You get a very bad headache. You start to feel mixed up (confused). You feel weak or numb. You feel faint. You have very bad pain in your: Chest. Belly (abdomen). You vomit more than once. You have trouble breathing. These symptoms may be an emergency. Get help right away. Call 911. Do not wait to see if the symptoms will go away. Do not drive yourself to the hospital. Summary Hypertension is another name for high blood pressure. High blood pressure forces your heart to work harder to pump blood. For most people, a normal blood pressure is less than 120/80. Making healthy choices can help lower blood pressure. If your blood pressure does not get lower with healthy choices, you may need to take medicine. This information is not intended to replace advice given to you by your health care provider. Make sure you discuss any questions you have with your health care provider. Document Revised: 01/25/2021 Document Reviewed: 01/25/2021 Elsevier Patient Education  2024 [employer].

## 2023-07-10 NOTE — Progress Notes (Signed)
 I,Morgan Norman, CMA,acting as a Neurosurgeon for Morgan Aliment, MD.,have documented all relevant documentation on the behalf of Morgan Aliment, MD,as directed by  Morgan Aliment, MD while in the presence of Morgan Aliment, MD.  Subjective:  Patient ID: Morgan Norman , female    DOB: January 21, 1956 , 68 y.o.   MRN: [id number]  Chief Complaint  Patient presents with   Hypertension   Hyperlipidemia    HPI  She is here today for bp check/chol check. She reports compliance with meds. She denies headaches, chest pain and shortness of breath. She states she has been exercising regularly.  She complains of back pain, she admits this issue is ongoing it has not gone away. She uses lidocaine patches which does help.   Hypertension This is a chronic problem. The current episode started more than 1 year ago. The problem has been gradually improving since onset. The problem is controlled. Pertinent negatives include no blurred vision, chest pain, palpitations or shortness of breath. The current treatment provides moderate improvement.     Past Medical History:  Diagnosis Date   Benign hypertensive renal disease    CKD (chronic kidney disease) stage 2, GFR 60-89 ml/min    Hypertension    Migraines    Overweight    Pure hypercholesterolemia    Vitamin D deficiency      Family History  Problem Relation Age of Onset   Diabetes Mellitus I Mother    Bladder Cancer Mother    Dementia Mother    Alcoholism Father      Current Outpatient Medications:    baclofen (LIORESAL) 10 MG tablet, Take 1 tablet (10 mg total) by mouth at bedtime as needed for muscle spasms., Disp: 30 tablet, Rfl: 1   Cholecalciferol 5000 UNITS TABS, Take 5,000 Units by mouth., Disp: , Rfl:    ELDERBERRY PO, Take 1,000 mg by mouth., Disp: , Rfl:    loratadine (CLARITIN) 10 MG tablet, Take 10 mg by mouth daily as needed for allergies., Disp: , Rfl:    meclizine (ANTIVERT) 25 MG tablet, Take 1 tablet (25 mg total) by  mouth 3 (three) times daily as needed for dizziness., Disp: 30 tablet, Rfl: 0   meloxicam (MOBIC) 15 MG tablet, , Disp: , Rfl:    Multiple Vitamins-Minerals (IMMUNE SUPPORT PO), Take 1,000 mg by mouth daily at 6 (six) AM., Disp: , Rfl:    nystatin cream (MYCOSTATIN), Apply topically 3 (three) times daily., Disp: 30 g, Rfl: 1   Omega-3 Fatty Acids (FISH OIL PO), Take 120 mg by mouth., Disp: , Rfl:    pantoprazole (PROTONIX) 20 MG tablet, TAKE 1 TABLET(20 MG) BY MOUTH DAILY, Disp: 90 tablet, Rfl: 2   Pramoxine-HC (HYDROCORTISONE ACE-PRAMOXINE) 2.5-1 % CREA, Apply 1 Application topically 2 (two) times daily as needed., Disp: 28.4 g, Rfl: 0   rosuvastatin (CRESTOR) 10 MG tablet, TAKE 1 TABLET(10 MG) BY MOUTH DAILY, Disp: 90 tablet, Rfl: 2   telmisartan (MICARDIS) 20 MG tablet, TAKE 1 TABLET(20 MG) BY MOUTH DAILY, Disp: 90 tablet, Rfl: 2   traMADol (ULTRAM) 50 MG tablet, Take 1 tablet (50 mg total) by mouth every 6 (six) hours as needed., Disp: 20 tablet, Rfl: 0   triamcinolone cream (KENALOG) 0.1 %, Apply 1 application. topically 2 (two) times daily., Disp: 30 g, Rfl: 1   valACYclovir (VALTREX) 500 MG tablet, Take 1 tablet (500 mg total) by mouth daily., Disp: 90 tablet, Rfl: 1   No Known Allergies  Review of Systems  Constitutional: Negative.   Eyes:  Negative for blurred vision.  Respiratory: Negative.  Negative for shortness of breath.   Cardiovascular: Negative.  Negative for chest pain and palpitations.  Neurological: Negative.   Psychiatric/Behavioral: Negative.       Today's Vitals   07/10/23 1434  BP: 124/78  Pulse: 71  Temp: 98.4 F (36.9 C)  SpO2: 98%  Weight: 175 lb (79.4 kg)  Height: 5\' 3"  (1.6 m)   Body mass index is 31 kg/m.  Wt Readings from Last 3 Encounters:  07/10/23 175 lb (79.4 kg)  05/01/23 171 lb (77.6 kg)  03/11/23 170 lb (77.1 kg)     Objective:  Physical Exam Vitals and nursing note reviewed.  Constitutional:      Appearance: Normal appearance.   HENT:     Head: Normocephalic and atraumatic.  Eyes:     Extraocular Movements: Extraocular movements intact.  Cardiovascular:     Rate and Rhythm: Normal rate and regular rhythm.     Heart sounds: Normal heart sounds.  Pulmonary:     Effort: Pulmonary effort is normal.     Breath sounds: Normal breath sounds.  Musculoskeletal:     Cervical back: Normal range of motion.  Skin:    General: Skin is warm.  Neurological:     General: No focal deficit present.     Mental Status: She is alert.  Psychiatric:        Behavior: Behavior normal.     Comments: tearful     IMAGING:  STUDY: MRI of the lumbar spine without intravenous contrast performed on 08/21/2022 10:52 AM.   COMPARISON: Radiographs performed on 11/17/2017 and CT lumbar spine dated 08/19/2017.   TECHNIQUE: Multiplanar, multisequence MR imaging obtained through the lumbar spine without contrast on 08/21/2022 10:52 AM.   CONTRAST: None.   FINDINGS:  # Osseous structures: Redemonstrated are fractures of the superior endplate of L1 and L4. These are chronic. No acute fractures or concerning marrow signal abnormality appreciated.  #  Alignment:Alignment maintained.  #  Conus medullaris/cauda equina: Spinal cord signal is normal and terminates at T12-L1. The cauda equina is unremarkable.   #  Lower thoracic spine: Mild disc desiccation. No significant spinal canal or foraminal stenosis.   #  T12-L1: Mild disc desiccation. Minimal retropulsion of the superior endplate resulting in mild effacement of the ventral thecal sac. No significant stenosis.  #  L1-L2: Mild disc desiccation and facet arthrosis. No significant stenosis.  #  L2-L3: Disc desiccation with mild loss of disc height. Small disc bulge. Mild facet arthrosis and ligamentum flavum redundancy. No significant stenosis.  #  L3-L4: Disc desiccation with mild loss of disc height and a disc bulge. Posterior central annular fissure. Mild facet arthrosis and ligamentum flavum  redundancy. Mild spinal canal/lateral recess narrowing and mild bilateral neuroforaminal stenosis.  #  L4-L5: Disc desiccation with mild loss of disc height. Disc bulge. Mild facet arthrosis. This produces mild spinal canal/lateral recess narrowing and mild bilateral neuroforaminal stenosis.  #  L5-S1: Disc desiccation with a tiny anterior annular fissure. No significant stenosis.   #  Paraspinal tissues: Unremarkable.     Assessment And Plan:  Hypertensive nephropathy Assessment & Plan: Chronic, controlled.   She will continue with telmisartan 20mg  daily. She is encouraged to follow low sodium diet. She will f/u in six months for re-evaluation.   Orders: -     CMP14+EGFR  Chronic renal disease, stage II Assessment & Plan: Chronic, she  is encouraged to keep BP well controlled and to stay well hydrated to decrease risk of CKD progression.    Chronic bilateral low back pain without sciatica Assessment & Plan: Chronic, she agrees to Ortho evaluation. She does have h/o compression fracture; however, she is advised there are other findings from May 2024 MRI that may be contributing to her sx. She was given rx baclofen to take nightly prn. She will let me know if her sx persist.   Orders: -     Ambulatory referral to Orthopedic Surgery  Grief Assessment & Plan: Unfortunately, since her last visit her sister has passed. She is currently feeling overwhelmed with handling of the estate. When/if she is ready, I will refer her to Hospice for grief counseling.    Other abnormal glucose Assessment & Plan: Previous labs reviewed, her A1c has been elevated in the past. I will check an A1c today. Reminded to avoid refined sugars including sugary drinks/foods and processed meats including bacon, sausages and deli meats.    Orders: -     Hemoglobin A1c  Class 1 obesity due to excess calories with serious comorbidity and body mass index (BMI) of 31.0 to 31.9 in adult Assessment & Plan: She is  encouraged to strive for BMI less than 30 to decrease cardiac risk. Advised to aim for at least 150 minutes of exercise per week.    History of compression fracture of spine -     Ambulatory referral to Orthopedic Surgery  History of herpes genitalis  Other orders -     Baclofen; Take 1 tablet (10 mg total) by mouth at bedtime as needed for muscle spasms.  Dispense: 30 tablet; Refill: 1 -     valACYclovir HCl; Take 1 tablet (500 mg total) by mouth daily.  Dispense: 90 tablet; Refill: 1  She is encouraged to strive for BMI less than 30 to decrease cardiac risk. Advised to aim for at least 150 minutes of exercise per week.    Return cancel 8/11 appt.  Patient was given opportunity to ask questions. Patient verbalized understanding of the plan and was able to repeat key elements of the plan. All questions were answered to their satisfaction.    I, Morgan Aliment, MD, have reviewed all documentation for this visit. The documentation on 07/10/23 for the exam, diagnosis, procedures, and orders are all accurate and complete.   IF YOU HAVE BEEN REFERRED TO A SPECIALIST, IT MAY TAKE 1-2 WEEKS TO SCHEDULE/PROCESS THE REFERRAL. IF YOU HAVE NOT HEARD FROM US/SPECIALIST IN TWO WEEKS, PLEASE GIVE [place] A CALL AT [phone] X [phone].   THE PATIENT IS ENCOURAGED TO PRACTICE SOCIAL DISTANCING DUE TO THE COVID-19 PANDEMIC.

## 2023-07-11 LAB — CMP14+EGFR
ALT: 11 IU/L (ref 0–32)
AST: 21 IU/L (ref 0–40)
Albumin: 4.6 g/dL (ref 3.9–4.9)
Alkaline Phosphatase: 73 IU/L (ref 44–121)
BUN/Creatinine Ratio: 13 (ref 12–28)
BUN: 11 mg/dL (ref 8–27)
Bilirubin Total: 0.4 mg/dL (ref 0.0–1.2)
CO2: 26 mmol/L (ref 20–29)
Calcium: 9.2 mg/dL (ref 8.7–10.3)
Chloride: 101 mmol/L (ref 96–106)
Creatinine, Ser: 0.87 mg/dL (ref 0.57–1.00)
Globulin, Total: 2.7 g/dL (ref 1.5–4.5)
Glucose: 83 mg/dL (ref 70–99)
Potassium: 4.9 mmol/L (ref 3.5–5.2)
Sodium: 140 mmol/L (ref 134–144)
Total Protein: 7.3 g/dL (ref 6.0–8.5)
eGFR: 72 mL/min/{1.73_m2} (ref >59)

## 2023-07-11 LAB — HEMOGLOBIN A1C
Est. average glucose Bld gHb Est-mCnc: 117 mg/dL
Hgb A1c MFr Bld: 5.7 % — ABNORMAL HIGH (ref 4.8–5.6)

## 2023-07-14 DIAGNOSIS — E66811 Obesity, class 1: Secondary | ICD-10-CM | POA: Insufficient documentation

## 2023-07-14 DIAGNOSIS — F4321 Adjustment disorder with depressed mood: Secondary | ICD-10-CM | POA: Insufficient documentation

## 2023-07-14 NOTE — Assessment & Plan Note (Signed)
 Chronic, she is encouraged to keep BP well controlled and to stay well hydrated to decrease risk of CKD progression.

## 2023-07-14 NOTE — Assessment & Plan Note (Signed)
 Chronic, controlled.   She will continue with telmisartan 20mg  daily. She is encouraged to follow low sodium diet. She will f/u in six months for re-evaluation.

## 2023-07-14 NOTE — Assessment & Plan Note (Signed)
 Previous labs reviewed, her A1c has been elevated in the past. I will check an A1c today. Reminded to avoid refined sugars including sugary drinks/foods and processed meats including bacon, sausages and deli meats.

## 2023-07-14 NOTE — Assessment & Plan Note (Signed)
 Unfortunately, since her last visit her sister has passed. She is currently feeling overwhelmed with handling of the estate. When/if she is ready, I will refer her to Hospice for grief counseling.

## 2023-07-14 NOTE — Assessment & Plan Note (Signed)
 She is encouraged to strive for BMI less than 30 to decrease cardiac risk. Advised to aim for at least 150 minutes of exercise per week.

## 2023-07-14 NOTE — Assessment & Plan Note (Addendum)
 Chronic, she agrees to Ortho evaluation. She does have h/o compression fracture; however, she is advised there are other findings from May 2024 MRI that may be contributing to her sx. She was given rx baclofen to take nightly prn. She will let me know if her sx persist.

## 2023-07-16 ENCOUNTER — Other Ambulatory Visit: Payer: Self-pay | Admitting: Internal Medicine

## 2023-07-16 DIAGNOSIS — R928 Other abnormal and inconclusive findings on diagnostic imaging of breast: Secondary | ICD-10-CM

## 2023-07-23 DIAGNOSIS — M461 Sacroiliitis, not elsewhere classified: Secondary | ICD-10-CM | POA: Diagnosis not present

## 2023-07-23 DIAGNOSIS — M7918 Myalgia, other site: Secondary | ICD-10-CM | POA: Diagnosis not present

## 2023-07-23 DIAGNOSIS — M47816 Spondylosis without myelopathy or radiculopathy, lumbar region: Secondary | ICD-10-CM | POA: Diagnosis not present

## 2023-07-30 ENCOUNTER — Ambulatory Visit
Admission: RE | Admit: 2023-07-30 | Discharge: 2023-07-30 | Disposition: A | Discharge status: Home / Self Care | Source: Ambulatory Visit | Attending: Internal Medicine | Admitting: Internal Medicine

## 2023-07-30 ENCOUNTER — Other Ambulatory Visit: Payer: Self-pay | Admitting: Internal Medicine

## 2023-07-30 DIAGNOSIS — R928 Other abnormal and inconclusive findings on diagnostic imaging of breast: Secondary | ICD-10-CM

## 2023-07-30 DIAGNOSIS — N6315 Unspecified lump in the right breast, overlapping quadrants: Secondary | ICD-10-CM | POA: Diagnosis not present

## 2023-07-30 DIAGNOSIS — N6310 Unspecified lump in the right breast, unspecified quadrant: Secondary | ICD-10-CM

## 2023-07-31 ENCOUNTER — Ambulatory Visit
Admission: RE | Admit: 2023-07-31 | Discharge: 2023-07-31 | Disposition: A | Discharge status: Home / Self Care | Source: Ambulatory Visit | Attending: Internal Medicine | Admitting: Internal Medicine

## 2023-07-31 DIAGNOSIS — N6315 Unspecified lump in the right breast, overlapping quadrants: Secondary | ICD-10-CM | POA: Diagnosis not present

## 2023-07-31 DIAGNOSIS — C50919 Malignant neoplasm of unspecified site of unspecified female breast: Secondary | ICD-10-CM

## 2023-07-31 DIAGNOSIS — N6310 Unspecified lump in the right breast, unspecified quadrant: Secondary | ICD-10-CM

## 2023-07-31 DIAGNOSIS — R92321 Mammographic fibroglandular density, right breast: Secondary | ICD-10-CM | POA: Diagnosis not present

## 2023-07-31 DIAGNOSIS — C50811 Malignant neoplasm of overlapping sites of right female breast: Secondary | ICD-10-CM | POA: Diagnosis not present

## 2023-07-31 HISTORY — DX: Malignant neoplasm of unspecified site of unspecified female breast: C50.919

## 2023-07-31 HISTORY — PX: BREAST BIOPSY: SHX20

## 2023-08-01 ENCOUNTER — Encounter: Payer: Self-pay | Admitting: Internal Medicine

## 2023-08-01 LAB — SURGICAL PATHOLOGY

## 2023-08-06 ENCOUNTER — Other Ambulatory Visit

## 2023-08-15 ENCOUNTER — Other Ambulatory Visit: Payer: Self-pay | Admitting: Surgery

## 2023-08-15 DIAGNOSIS — C50911 Malignant neoplasm of unspecified site of right female breast: Secondary | ICD-10-CM | POA: Diagnosis not present

## 2023-08-15 DIAGNOSIS — Z853 Personal history of malignant neoplasm of breast: Secondary | ICD-10-CM

## 2023-08-18 ENCOUNTER — Other Ambulatory Visit: Payer: Self-pay | Admitting: Surgery

## 2023-08-18 DIAGNOSIS — Z853 Personal history of malignant neoplasm of breast: Secondary | ICD-10-CM

## 2023-08-20 ENCOUNTER — Inpatient Hospital Stay

## 2023-08-20 ENCOUNTER — Ambulatory Visit: Admitting: Hematology

## 2023-08-20 ENCOUNTER — Inpatient Hospital Stay: Attending: Hematology and Oncology | Admitting: Hematology and Oncology

## 2023-08-20 ENCOUNTER — Encounter: Payer: Self-pay | Admitting: *Deleted

## 2023-08-20 VITALS — BP 132/78 | HR 79 | Temp 97.4°F | Resp 17 | Wt 171.9 lb

## 2023-08-20 DIAGNOSIS — Z79899 Other long term (current) drug therapy: Secondary | ICD-10-CM | POA: Insufficient documentation

## 2023-08-20 DIAGNOSIS — Z1732 Human epidermal growth factor receptor 2 negative status: Secondary | ICD-10-CM | POA: Diagnosis not present

## 2023-08-20 DIAGNOSIS — Z170 Estrogen receptor positive status [ER+]: Secondary | ICD-10-CM | POA: Diagnosis not present

## 2023-08-20 DIAGNOSIS — C50411 Malignant neoplasm of upper-outer quadrant of right female breast: Secondary | ICD-10-CM | POA: Insufficient documentation

## 2023-08-20 DIAGNOSIS — Z1722 Progesterone receptor negative status: Secondary | ICD-10-CM | POA: Insufficient documentation

## 2023-08-20 DIAGNOSIS — Z8052 Family history of malignant neoplasm of bladder: Secondary | ICD-10-CM | POA: Diagnosis not present

## 2023-08-20 DIAGNOSIS — M461 Sacroiliitis, not elsewhere classified: Secondary | ICD-10-CM | POA: Diagnosis not present

## 2023-08-20 LAB — RESEARCH LABS

## 2023-08-20 NOTE — Progress Notes (Signed)
 [facility] CONSULT NOTE  Patient Care Team: Cleave Curling, MD as PCP - General (Internal Medicine)  CHIEF COMPLAINTS/PURPOSE OF CONSULTATION:  Newly diagnosed breast cancer  HISTORY OF PRESENTING ILLNESS: Morgan Norman is a 68 year old with the above-mentioned history of newly diagnosed breast cancer.  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  She is here today by herself to discuss her treatment plan.   I reviewed her records extensively and collaborated the history with the patient.  SUMMARY OF ONCOLOGIC HISTORY: Oncology History  Malignant neoplasm of upper-outer quadrant of right breast in female, estrogen receptor positive (HCC)  07/31/2023 Initial Diagnosis   Screening mammogram detected right breast mass 1.4 cm by ultrasound at 9 o'clock position 4 cm from the nipple, no axillary lymph nodes, biopsy: Grade 2 IDC with intermediate grade DCIS, ER 95%, PR 0%, Ki-67 3%, HER2 equivocal 2+ by IHC, FISH negative ratio 0.95   08/20/2023 Cancer Staging   Staging form: Breast, AJCC 8th Edition - Clinical: Stage IA (cT1c, cN0, cM0, G2, ER+, PR-, HER2-) - Signed by Cameron Cea, MD on 08/20/2023 Stage prefix: Initial diagnosis Histologic grading system: 3 grade system      MEDICAL HISTORY:  Past Medical History:  Diagnosis Date   Benign hypertensive renal disease    CKD (chronic kidney disease) stage 2, GFR 60-89 ml/min    Hypertension    Migraines    Overweight    Pure hypercholesterolemia    Vitamin D deficiency     SURGICAL HISTORY: Past Surgical History:  Procedure Laterality Date   bilateral foot surgery     BREAST BIOPSY Right 07/31/2023   US  RT BREAST BX W LOC DEV 1ST LESION IMG BX SPEC US  GUIDE 07/31/2023 [facility] MAMMOGRAPHY   CESAREAN SECTION     TOTAL ABDOMINAL HYSTERECTOMY W/ BILATERAL  SALPINGOOPHORECTOMY      SOCIAL HISTORY: Social History   Socioeconomic History   Marital status: Married    Spouse name: Not on file   Number of children: Not on file   Years of education: Not on file   Highest education level: Bachelor's degree (e.g., BA, AB, BS)  Occupational History   Occupation: retired  Tobacco Use   Smoking status: Never   Smokeless tobacco: Never  Vaping Use   Vaping status: Never Used  Substance and Sexual Activity   Alcohol use: Not Currently    Alcohol/week: 0.0 standard drinks of alcohol    Comment: rarely   Drug use: No   Sexual activity: Yes  Other Topics Concern   Not on file  Social History Narrative   Drinks 2 cups of coffee daily.   Social Drivers of Corporate investment banker Strain: Low Risk  (07/22/2023)   Received from [facility]   Overall Financial Resource Strain (CARDIA)    Difficulty of Paying Living Expenses: Not hard at all  Food Insecurity: No Food Insecurity (07/22/2023)   Received from [facility]   Hunger Vital Sign    Worried About Running Out of Food in the Last Year: Never true    Ran Out of Food in the Last Year: Never true  Transportation Needs: No Transportation Needs (07/22/2023)   Received from [facility] - Transportation    Lack of Transportation (Medical): No    Lack of Transportation (Non-Medical):  No  Physical Activity: Inactive (07/22/2023)   Received from [facility]   Exercise Vital Sign    Days of Exercise per Week: 0 days    Minutes of Exercise per Session: 20 min  Stress: Stress Concern Present (07/22/2023)   Received from [facility] - Occupational Stress Questionnaire    Feeling of Stress : Rather much  Social Connections: Socially Integrated (07/22/2023)   Received from [facility]   Social Network    How would you rate your social network (family, work, friends)?: Good participation with social networks  Intimate Partner Violence:  Not At Risk (07/22/2023)   Received from [facility]   HITS    Over the last 12 months how often did your partner physically hurt you?: Never    Over the last 12 months how often did your partner insult you or talk down to you?: Never    Over the last 12 months how often did your partner threaten you with physical harm?: Never    Over the last 12 months how often did your partner scream or curse at you?: Never    FAMILY HISTORY: Family History  Problem Relation Age of Onset   Diabetes Mellitus I Mother    Bladder Cancer Mother    Dementia Mother    Alcoholism Father     ALLERGIES:  has no known allergies.  MEDICATIONS:  Current Outpatient Medications  Medication Sig Dispense Refill   baclofen  (LIORESAL ) 10 MG tablet Take 1 tablet (10 mg total) by mouth at bedtime as needed for muscle spasms. 30 tablet 1   Cholecalciferol 5000 UNITS TABS Take 5,000 Units by mouth.     ELDERBERRY PO Take 1,000 mg by mouth.     loratadine (CLARITIN) 10 MG tablet Take 10 mg by mouth daily as needed for allergies.     meclizine  (ANTIVERT ) 25 MG tablet Take 1 tablet (25 mg total) by mouth 3 (three) times daily as needed for dizziness. 30 tablet 0   meloxicam (MOBIC) 15 MG tablet      Multiple Vitamins-Minerals (IMMUNE SUPPORT PO) Take 1,000 mg by mouth daily at 6 (six) AM.     nystatin  cream (MYCOSTATIN ) Apply topically 3 (three) times daily. 30 g 1   Omega-3 Fatty Acids (FISH OIL PO) Take 120 mg by mouth.     pantoprazole  (PROTONIX ) 20 MG tablet TAKE 1 TABLET(20 MG) BY MOUTH DAILY 90 tablet 2   Pramoxine-HC (HYDROCORTISONE  ACE-PRAMOXINE) 2.5-1 % CREA Apply 1 Application topically 2 (two) times daily as needed. 28.4 g 0   rosuvastatin  (CRESTOR ) 10 MG tablet TAKE 1 TABLET(10 MG) BY MOUTH DAILY 90 tablet 2   telmisartan  (MICARDIS ) 20 MG tablet TAKE 1 TABLET(20 MG) BY MOUTH DAILY 90 tablet 2   triamcinolone  cream (KENALOG ) 0.1 % Apply 1 application. topically 2 (two) times daily. 30 g 1   valACYclovir   (VALTREX ) 500 MG tablet Take 1 tablet (500 mg total) by mouth daily. 90 tablet 1   No current facility-administered medications for this visit.    REVIEW OF SYSTEMS:   Constitutional: Denies fevers, chills or abnormal night sweats Breast:  Denies any palpable lumps or discharge All other systems were reviewed with the patient and are negative.  PHYSICAL EXAMINATION: ECOG PERFORMANCE STATUS: 0 - Asymptomatic  Vitals:   08/20/23 1316  BP: 132/78  Pulse: 79  Resp: 17  Temp: (!) 97.4 F (36.3 C)  SpO2: 99%   Filed Weights   08/20/23  1316  Weight: 171 lb 14.4 oz (78 kg)    GENERAL:alert, no distress and comfortable    LABORATORY DATA:  I have reviewed the data as listed Lab Results  Component Value Date   WBC 3.9 12/05/2022   HGB 12.5 12/05/2022   HCT 38.7 12/05/2022   MCV 91 12/05/2022   PLT 109 (L) 12/05/2022   Lab Results  Component Value Date   NA 140 07/10/2023   K 4.9 07/10/2023   CL 101 07/10/2023   CO2 26 07/10/2023    RADIOGRAPHIC STUDIES: I have personally reviewed the radiological reports and agreed with the findings in the report.  ASSESSMENT AND PLAN:  Malignant neoplasm of upper-outer quadrant of right breast in female, estrogen receptor positive (HCC) 07/31/2023:Screening mammogram detected right breast mass 1.4 cm by ultrasound at 9 o'clock position 4 cm from the nipple, no axillary lymph nodes, biopsy: Grade 2 IDC with intermediate grade DCIS, ER 95%, PR 0%, Ki-67 3%, HER2 equivocal 2+ by IHC, FISH negative ratio 0.95  Pathology and radiology counseling:Discussed with the patient, the details of pathology including the type of breast cancer,the clinical staging, the significance of ER, PR and HER-2/neu receptors and the implications for treatment. After reviewing the pathology in detail, we proceeded to discuss the different treatment options between surgery, radiation, chemotherapy, antiestrogen therapies.  Recommendations: 1. Breast conserving  surgery followed by 2. Oncotype DX testing to determine if chemotherapy would be of any benefit followed by 3. Adjuvant radiation therapy followed by 4. Adjuvant antiestrogen therapy  Oncotype counseling: I discussed Oncotype DX test. I explained to the patient that this is a 21 gene panel to evaluate patient tumors DNA to calculate recurrence score. This would help determine whether patient has high risk or low risk breast cancer. She understands that if her tumor was found to be high risk, she would benefit from systemic chemotherapy. If low risk, no need of chemotherapy.  Return to clinic after surgery to discuss final pathology report and then determine if Oncotype DX testing will need to be sent.     All questions were answered. The patient knows to call the clinic with any problems, questions or concerns.    Viinay K Mickelle Goupil, MD 08/20/23

## 2023-08-20 NOTE — Research (Signed)
 Trial: [employer] 2021-05 - Specimen Collection Study to Evaluate Biomarkers in Subjects with Cancer    Patient Morgan Norman was identified by Dr. Gudena as a potential candidate for the above listed study.  This Clinical Research Nurse met with Morgan Norman, Morgan Norman, on 08/20/23 in a manner and location that ensures patient privacy to discuss participation in the above listed research study.  Patient is Unaccompanied.  A copy of the informed consent document with embedded HIPAA language was provided to the patient.  Patient reads, speaks, and understands [place].    Patient was provided with the business card of this Nurse and encouraged to contact the research team with any questions.  Patient was provided the option of taking informed consent documents home to review and was encouraged to review at their convenience with their support network, including other care providers. Patient is comfortable with making a decision regarding study participation today.  As outlined in the informed consent form, this Nurse and Morgan Norman discussed the purpose of the research study, the investigational nature of the study, study procedures and requirements for study participation, potential risks and benefits of study participation, as well as alternatives to participation. This study is not blinded. The patient understands participation is voluntary and they may withdraw from study participation at any time.  This study does not involve randomization.  This study does not involve an investigational drug or device. This study does not involve a placebo. Patient understands enrollment is pending full eligibility review.   Confidentiality and how the patient's information will be used as part of study participation were discussed.  Patient was informed there is reimbursement provided for their time and effort spent on trial participation.     All questions were answered to  patient's satisfaction.  The informed consent with embedded HIPAA language was reviewed page by page.  The patient's mental and emotional status is appropriate to provide informed consent, and the patient verbalizes an understanding of study participation.  Patient has agreed to participate in the above listed research study and has voluntarily signed the informed consent IRB approved version 05 May 2021, revised 21 May 2021 with embedded HIPPA on 08/20/23 at [facility].  The patient was provided with a copy of the signed informed consent form with embedded HIPAA language for their reference.  No study specific procedures were obtained prior to the signing of the informed consent document.  Approximately 20 minutes were spent with the patient reviewing the informed consent documents.  Patient was not requested to complete a Release of Information form.   Eligibility: Eligibility criteria reviewed with patient. This nurse/coordinator has reviewed this patient's inclusion and exclusion criteria and confirmed patient is eligible for study participation. Eligibility confirmed by treating investigator, who also agrees that patient should proceed with enrollment. Patient will continue with enrollment.  Data Collection: Patient was interviewed to collect the following information.  Medical History:  High Blood Pressure  Yes Coronary Artery Disease No Lupus    No Rheumatoid Arthritis  No Diabetes   No      Lynch Syndrome  No  Is the patient currently taking a magnesium supplement?   No  Does the patient have a personal history of cancer (greater than 5 years ago)?  No  Does the patient have a family history of cancer in 1st or 2nd degree relatives? Yes If yes, Relationship(s) and Cancer type(s)? Mother, bladder cancer  Does the patient have history of alcohol consumption? No  Does the patient have history of cigarette, cigar, pipe, or chewing tobacco use?  No   Blood Collection: Research blood obtained  by fresh venipuncture. Patient tolerated well without any adverse events.  Gift Card: $50 gift card given to patient for her participation in this study.    Patient was thanked for their participation in this study.    Morgan Norman, BSN, RN, [employer] Research Nurse II [phone] 08/20/2023 2:35 PM

## 2023-08-20 NOTE — Research (Signed)
 [employer] 2021-05 - Specimen Collection Study to Evaluate Biomarkers in Subjects with Cancer    This Coordinator has reviewed this patient's inclusion and exclusion criteria as a second review and confirms Morgan Norman is eligible for study participation.  Patient may continue with enrollment.   Keslee Harrington, Ph.D. Clinical Research Coordinator [phone] 08/20/23 2:15 PM

## 2023-08-20 NOTE — Assessment & Plan Note (Signed)
 07/31/2023:Screening mammogram detected right breast mass 1.4 cm by ultrasound at 9 o'clock position 4 cm from the nipple, no axillary lymph nodes, biopsy: Grade 2 IDC with intermediate grade DCIS, ER 95%, PR 0%, Ki-67 3%, HER2 equivocal 2+ by IHC, FISH negative ratio 0.95  Pathology and radiology counseling:Discussed with the patient, the details of pathology including the type of breast cancer,the clinical staging, the significance of ER, PR and HER-2/neu receptors and the implications for treatment. After reviewing the pathology in detail, we proceeded to discuss the different treatment options between surgery, radiation, chemotherapy, antiestrogen therapies.  Recommendations: 1. Breast conserving surgery followed by 2. Oncotype DX testing to determine if chemotherapy would be of any benefit followed by 3. Adjuvant radiation therapy followed by 4. Adjuvant antiestrogen therapy  Oncotype counseling: I discussed Oncotype DX test. I explained to the patient that this is a 21 gene panel to evaluate patient tumors DNA to calculate recurrence score. This would help determine whether patient has high risk or low risk breast cancer. She understands that if her tumor was found to be high risk, she would benefit from systemic chemotherapy. If low risk, no need of chemotherapy.  Return to clinic after surgery to discuss final pathology report and then determine if Oncotype DX testing will need to be sent.

## 2023-08-21 ENCOUNTER — Telehealth: Payer: Self-pay | Admitting: Hematology and Oncology

## 2023-08-21 ENCOUNTER — Telehealth: Payer: Self-pay | Admitting: Radiation Oncology

## 2023-08-21 NOTE — Telephone Encounter (Signed)
 Left message for patient to call back to schedule consult per 4/25 referral.

## 2023-08-21 NOTE — Telephone Encounter (Signed)
 Confirmed with pt scheduled appt date and time

## 2023-08-22 ENCOUNTER — Inpatient Hospital Stay
Admission: RE | Admit: 2023-08-22 | Discharge: 2023-08-22 | Disposition: A | Discharge status: Home / Self Care | Payer: Self-pay | Source: Ambulatory Visit | Attending: Radiation Oncology | Admitting: Radiation Oncology

## 2023-08-22 ENCOUNTER — Other Ambulatory Visit: Payer: Self-pay | Admitting: Radiation Oncology

## 2023-08-22 DIAGNOSIS — C50911 Malignant neoplasm of unspecified site of right female breast: Secondary | ICD-10-CM

## 2023-08-25 ENCOUNTER — Encounter (HOSPITAL_BASED_OUTPATIENT_CLINIC_OR_DEPARTMENT_OTHER): Payer: Self-pay | Admitting: Surgery

## 2023-08-25 NOTE — Progress Notes (Signed)
 Radiation Oncology         [phone] ________________________________  Name: Norman Norman        MRN: [id number]  Date of Service: 08/26/2023 DOB: 1955/12/24  [id number], Norman Morgan, MD  [name], MD     REFERRING PHYSICIAN: [name], MD   DIAGNOSIS: There were no encounter diagnoses.   HISTORY OF PRESENT ILLNESS: Norman Norman is a 68 y.o. female seen at the request of Dr. Lucienne Ryder for a new diagnosis of breast cancer.  The patient had a routine screening mammogram that identified a mass in the right breast.  She returned on 07/30/2023 for diagnostic imaging and spot compression views showed persistence in a mass in the outer right breast.  An adjacent symmetry seen on screening mammogram in the upper outer quadrant disperses with compression felt to be overlapping fibroglandular tissue.  Targeted ultrasound demonstrated the mass in the 9 o'clock position measuring 1.4 cm in greatest dimension, and normal fibroglandular tissue was noted throughout the upper outer quadrant from 9-12 o'clock.  No other suspicious findings were noted in the right axilla was negative for adenopathy.  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  Her cancer was ER positive PR negative HER2 negative by FISH with a Ki-67 of 3%.  She is scheduled to undergo lumpectomy on 09/01/2023.  She met with Dr. Gudena and he recommends Oncotype DX testing as well as antiestrogen therapy.  She is seen today to discuss adjuvant radiotherapy to the breast following lumpectomy.    PREVIOUS RADIATION THERAPY: {EXAM; YES/NO:19492::"No"}   PAST MEDICAL HISTORY:  Past Medical History:  Diagnosis Date   Benign hypertensive renal disease    CKD (chronic kidney disease) stage 2, GFR 60-89 ml/min    Hypertension    Migraines    Overweight    Pure hypercholesterolemia    Vitamin D deficiency        PAST SURGICAL  HISTORY: Past Surgical History:  Procedure Laterality Date   bilateral foot surgery     BREAST BIOPSY Right 07/31/2023   US  RT BREAST BX W LOC DEV 1ST LESION IMG BX SPEC US  GUIDE 07/31/2023 [facility] MAMMOGRAPHY   CESAREAN SECTION     TOTAL ABDOMINAL HYSTERECTOMY W/ BILATERAL SALPINGOOPHORECTOMY       FAMILY HISTORY:  Family History  Problem Relation Age of Onset   Diabetes Mellitus I Mother    Bladder Cancer Mother    Dementia Mother    Alcoholism Father      SOCIAL HISTORY:  reports that she has never smoked. She has never used smokeless tobacco. She reports that she does not currently use alcohol. She reports that she does not use drugs.  The patient is married and lives in [place].  She works for [employer] as a***   ALLERGIES: Patient has no known allergies.   MEDICATIONS:  Current Outpatient Medications  Medication Sig Dispense Refill   baclofen  (LIORESAL ) 10 MG tablet Take 1 tablet (10 mg total) by mouth at bedtime as needed for muscle spasms. 30 tablet 1   Cholecalciferol 5000 UNITS TABS Take 5,000 Units by mouth.     ELDERBERRY PO Take 1,000 mg by mouth.     loratadine (CLARITIN) 10 MG tablet Take 10 mg by mouth daily as needed for allergies.     meclizine  (ANTIVERT ) 25 MG tablet Take 1 tablet (25 mg total) by mouth 3 (three) times daily as needed for dizziness. 30 tablet  0   meloxicam (MOBIC) 15 MG tablet      Multiple Vitamins-Minerals (IMMUNE SUPPORT PO) Take 1,000 mg by mouth daily at 6 (six) AM.     nystatin  cream (MYCOSTATIN ) Apply topically 3 (three) times daily. 30 g 1   Omega-3 Fatty Acids (FISH OIL PO) Take 120 mg by mouth.     pantoprazole  (PROTONIX ) 20 MG tablet TAKE 1 TABLET(20 MG) BY MOUTH DAILY 90 tablet 2   Pramoxine-HC (HYDROCORTISONE  ACE-PRAMOXINE) 2.5-1 % CREA Apply 1 Application topically 2 (two) times daily as needed. 28.4 g 0   rosuvastatin  (CRESTOR ) 10 MG tablet TAKE 1 TABLET(10 MG) BY MOUTH DAILY 90 tablet 2   telmisartan  (MICARDIS ) 20  MG tablet TAKE 1 TABLET(20 MG) BY MOUTH DAILY 90 tablet 2   triamcinolone  cream (KENALOG ) 0.1 % Apply 1 application. topically 2 (two) times daily. 30 g 1   valACYclovir  (VALTREX ) 500 MG tablet Take 1 tablet (500 mg total) by mouth daily. 90 tablet 1   No current facility-administered medications for this visit.     REVIEW OF SYSTEMS: On review of systems, the patient reports that she is doing ***     PHYSICAL EXAM:  Wt Readings from Last 3 Encounters:  08/20/23 171 lb 14.4 oz (78 kg)  07/10/23 175 lb (79.4 kg)  05/01/23 171 lb (77.6 kg)   Temp Readings from Last 3 Encounters:  08/20/23 (!) 97.4 F (36.3 C) (Temporal)  07/10/23 98.4 F (36.9 C)  05/01/23 98.1 F (36.7 C) (Oral)   BP Readings from Last 3 Encounters:  08/20/23 132/78  07/10/23 124/78  05/01/23 110/80   Pulse Readings from Last 3 Encounters:  08/20/23 79  07/10/23 71  05/01/23 81    In general this is a well appearing African-American  female in no acute distress. She's alert and oriented x4 and appropriate throughout the examination. Cardiopulmonary assessment is negative for acute distress and she exhibits normal effort. Bilateral breast exam is deferred.    ECOG = ***  0 - Asymptomatic (Fully active, able to carry on all predisease activities without restriction)  1 - Symptomatic but completely ambulatory (Restricted in physically strenuous activity but ambulatory and able to carry out work of a light or sedentary nature. For example, light housework, office work)  2 - Symptomatic, <50% in bed during the day (Ambulatory and capable of all self care but unable to carry out any work activities. Up and about more than 50% of waking hours)  3 - Symptomatic, >50% in bed, but not bedbound (Capable of only limited self-care, confined to bed or chair 50% or more of waking hours)  4 - Bedbound (Completely disabled. Cannot carry on any self-care. Totally confined to bed or chair)  5 - Death   Aurea Blossom MM,  Creech RH, Tormey DC, et al. [phone]). "Toxicity and response criteria of the [facility] Group". Am. Hillard Lowes. Oncol. 5 (6): 649-55    LABORATORY DATA:  Lab Results  Component Value Date   WBC 3.9 12/05/2022   HGB 12.5 12/05/2022   HCT 38.7 12/05/2022   MCV 91 12/05/2022   PLT 109 (L) 12/05/2022   Lab Results  Component Value Date   NA 140 07/10/2023   K 4.9 07/10/2023   CL 101 07/10/2023   CO2 26 07/10/2023   Lab Results  Component Value Date   ALT 11 07/10/2023   AST 21 07/10/2023   ALKPHOS 73 07/10/2023   BILITOT 0.4 07/10/2023      RADIOGRAPHY:  US  RT BREAST BX W LOC DEV 1ST LESION IMG BX SPEC US  GUIDE Addendum Date: 08/01/2023 ADDENDUM REPORT: 08/01/2023 15:42 ADDENDUM: Pathology revealed GRADE II INVASIVE DUCTAL CARCINOMA, DUCTAL CARCINOMA IN SITU, CRIBRIFORM TYPE, INTERMEDIATE NUCLEAR GRADE of the RIGHT breast, 9 o'clock, 4cmfn, (coil clip). This was found to be concordant by Dr. Luann Rundle Mir. Pathology results were discussed with the patient by telephone. The patient reported doing well after the biopsy with tenderness at the site. Post biopsy instructions and care were reviewed and questions were answered. The patient was encouraged to call [facility] for any additional concerns. My direct phone number was provided. Surgical consultation has been arranged with Dr. Salena Craven at [facility] on August 15, 2023. Pathology results reported by Kraig Peru, RN on 08/01/2023. Electronically Signed   By: Elester Grim M.D.   On: 08/01/2023 15:42   Result Date: 08/01/2023 CLINICAL DATA:  68 year old woman with 1.4 cm outer right breast mass (9 o'clock 4 CMFN) presents for ultrasound-guided biopsy. EXAM: ULTRASOUND GUIDED RIGHT BREAST CORE NEEDLE BIOPSY COMPARISON:  Previous exam(s). PROCEDURE: I met with the patient and we discussed the procedure of ultrasound-guided biopsy, including benefits and alternatives. We discussed the  high likelihood of a successful procedure. We discussed the risks of the procedure, including infection, bleeding, tissue injury, clip migration, and inadequate sampling. Informed written consent was given. The usual time-out protocol was performed immediately prior to the procedure. Lesion quadrant: Upper outer quadrant Using sterile technique and 1% Lidocaine as local anesthetic, under direct ultrasound visualization, a 14 gauge spring-loaded device was used to perform biopsy of the 1.4 cm right outer breast mass (9 o'clock 4 CMFN) using a inferior approach. At the conclusion of the procedure a coil shaped tissue marker clip was deployed into the biopsy cavity. Follow up 2 view mammogram was performed and dictated separately. IMPRESSION: Ultrasound guided biopsy of right breast mass (9 o'clock 4 CMFN). No apparent complications. Electronically Signed: By: Elester Grim M.D. On: 07/31/2023 14:09   MM CLIP PLACEMENT RIGHT Result Date: 07/31/2023 CLINICAL DATA:  Status post ultrasound-guided biopsy of right breast mass EXAM: 3D DIAGNOSTIC RIGHT MAMMOGRAM POST ULTRASOUND BIOPSY COMPARISON:  Previous exam(s). ACR Breast Density Category b: There are scattered areas of fibroglandular density. FINDINGS: 3D Mammographic images were obtained following ultrasound guided biopsy of right breast mass (9 o'clock 4 CMFN). The biopsy marking clip is located 1 cm anterior to the biopsied mass. IMPRESSION: Coil shaped biopsy marking clip is located 1 cm anterior to the right breast mass biopsied at 9 o'clock 4 CMFN. Final Assessment: Post Procedure Mammograms for Marker Placement Electronically Signed   By: Elester Grim M.D.   On: 07/31/2023 14:13   MM 3D DIAGNOSTIC MAMMOGRAM UNILATERAL RIGHT BREAST Result Date: 07/30/2023 CLINICAL DATA:  Recall from screening mammography, possible mass and an adjacent possible asymmetry in the upper and outer RIGHT breast. EXAM: DIGITAL DIAGNOSTIC UNILATERAL RIGHT MAMMOGRAM WITH TOMOSYNTHESIS  AND CAD; ULTRASOUND RIGHT BREAST LIMITED TECHNIQUE: Right digital diagnostic mammography and breast tomosynthesis was performed. The images were evaluated with computer-aided detection. ; Targeted ultrasound examination of the right breast was performed COMPARISON:  Previous exam(s). ACR Breast Density Category b: There are scattered areas of fibroglandular density. FINDINGS: Spot-compression CC and MLO views of the areas of concern were obtained. Isodense mass with somewhat irregular margins in the outer breast at middle depth measuring on the order of 1.5 cm. No associated architectural distortion or suspicious calcifications. The adjacent asymmetry  in the UPPER OUTER QUADRANT questioned on screening mammography persists but disperses with compression, likely indicating overlapping fibroglandular tissue. There is no underlying mass or architectural distortion. Targeted ultrasound is performed, demonstrating an irregular hypoechoic mass with vague and angular margins at 9 o'clock 4 cm from the nipple at middle depth, measuring approximately 1.4 x 0.9 x 1.4 cm, demonstrating slight posterior acoustic enhancement and demonstrating internal power Doppler flow, corresponding to the mammographic mass. Normal dense fibroglandular tissue is present throughout the remainder of the UPPER OUTER QUADRANT with imaging from 9 o'clock through 12 o'clock. No other suspicious solid mass or abnormal acoustic shadowing is identified. Sonographic evaluation of the axilla demonstrates no pathologic lymphadenopathy. IMPRESSION: 1. Highly suspicious 1.4 cm mass in the outer RIGHT breast at 9 o'clock 4 cm from the nipple. 2. No pathologic RIGHT axillary lymphadenopathy. RECOMMENDATION: Ultrasound-guided core needle biopsy of the RIGHT breast mass. I have discussed the findings and recommendations with the patient. The ultrasound core needle biopsy procedure was discussed with the patient and her questions were answered. She wishes to  proceed with the biopsy which will be scheduled by the [facility] staff. BI-RADS CATEGORY  5: Highly suggestive of malignancy. Electronically Signed   By: Rinda Cheers M.D.   On: 07/30/2023 12:12   US  LIMITED ULTRASOUND INCLUDING AXILLA RIGHT BREAST Result Date: 07/30/2023 CLINICAL DATA:  Recall from screening mammography, possible mass and an adjacent possible asymmetry in the upper and outer RIGHT breast. EXAM: DIGITAL DIAGNOSTIC UNILATERAL RIGHT MAMMOGRAM WITH TOMOSYNTHESIS AND CAD; ULTRASOUND RIGHT BREAST LIMITED TECHNIQUE: Right digital diagnostic mammography and breast tomosynthesis was performed. The images were evaluated with computer-aided detection. ; Targeted ultrasound examination of the right breast was performed COMPARISON:  Previous exam(s). ACR Breast Density Category b: There are scattered areas of fibroglandular density. FINDINGS: Spot-compression CC and MLO views of the areas of concern were obtained. Isodense mass with somewhat irregular margins in the outer breast at middle depth measuring on the order of 1.5 cm. No associated architectural distortion or suspicious calcifications. The adjacent asymmetry in the UPPER OUTER QUADRANT questioned on screening mammography persists but disperses with compression, likely indicating overlapping fibroglandular tissue. There is no underlying mass or architectural distortion. Targeted ultrasound is performed, demonstrating an irregular hypoechoic mass with vague and angular margins at 9 o'clock 4 cm from the nipple at middle depth, measuring approximately 1.4 x 0.9 x 1.4 cm, demonstrating slight posterior acoustic enhancement and demonstrating internal power Doppler flow, corresponding to the mammographic mass. Normal dense fibroglandular tissue is present throughout the remainder of the UPPER OUTER QUADRANT with imaging from 9 o'clock through 12 o'clock. No other suspicious solid mass or abnormal acoustic shadowing is  identified. Sonographic evaluation of the axilla demonstrates no pathologic lymphadenopathy. IMPRESSION: 1. Highly suspicious 1.4 cm mass in the outer RIGHT breast at 9 o'clock 4 cm from the nipple. 2. No pathologic RIGHT axillary lymphadenopathy. RECOMMENDATION: Ultrasound-guided core needle biopsy of the RIGHT breast mass. I have discussed the findings and recommendations with the patient. The ultrasound core needle biopsy procedure was discussed with the patient and her questions were answered. She wishes to proceed with the biopsy which will be scheduled by [facility] staff. BI-RADS CATEGORY  5: Highly suggestive of malignancy. Electronically Signed   By: Rinda Cheers M.D.   On: 07/30/2023 12:12       IMPRESSION/PLAN: 1. 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 Dr.  Jeryl Moris discusses the pathology findings and reviews the nature of early-stage right breast disease.  She is planning on lumpectomy next Monday with Dr. Lucienne Ryder.  Dr. Gudena has recommended Oncotype Dx score to determine a role for systemic therapy. Provided that chemotherapy is not indicated, the patient's course would then be followed by external radiotherapy to the breast  to reduce risks of local recurrence. Dr. Lee Public anticipates adjuvant antiestrogen therapy to follow. We discussed the risks, benefits, short, and long term effects of radiotherapy, as well as the curative intent, and the patient is interested in proceeding. Dr. Jeryl Moris discusses the delivery and logistics of radiotherapy and anticipates a course of 4 weeks of radiotherapy to the right breast. We will see her back a few weeks after surgery to discuss the simulation process and anticipate we starting radiotherapy about 4-6 weeks after surgery.      In a visit lasting *** minutes, greater than 50% of the time was spent face to face reviewing her case, as well as in preparation of, discussing, and  coordinating the patient's care.  The above documentation reflects my direct findings during this shared patient visit. Please see the separate note by Dr. Jeryl Moris on this date for the remainder of the patient's plan of care.    Shelvia Dick, [facility]    **Disclaimer: This note was dictated with voice recognition software. Similar sounding words can inadvertently be transcribed and this note may contain transcription errors which may not have been corrected upon publication of note.**

## 2023-08-26 ENCOUNTER — Encounter: Payer: Self-pay | Admitting: Radiation Oncology

## 2023-08-26 ENCOUNTER — Ambulatory Visit
Admission: RE | Admit: 2023-08-26 | Discharge: 2023-08-26 | Disposition: A | Discharge status: Home / Self Care | Source: Ambulatory Visit | Attending: Radiation Oncology | Admitting: Radiation Oncology

## 2023-08-26 VITALS — BP 136/77 | HR 65 | Temp 97.5°F | Resp 18 | Ht 65.0 in | Wt 168.6 lb

## 2023-08-26 DIAGNOSIS — C50411 Malignant neoplasm of upper-outer quadrant of right female breast: Secondary | ICD-10-CM | POA: Diagnosis not present

## 2023-08-26 DIAGNOSIS — E7800 Pure hypercholesterolemia, unspecified: Secondary | ICD-10-CM | POA: Diagnosis not present

## 2023-08-26 DIAGNOSIS — Z170 Estrogen receptor positive status [ER+]: Secondary | ICD-10-CM | POA: Diagnosis not present

## 2023-08-26 DIAGNOSIS — E559 Vitamin D deficiency, unspecified: Secondary | ICD-10-CM | POA: Diagnosis not present

## 2023-08-26 DIAGNOSIS — Z79624 Long term (current) use of inhibitors of nucleotide synthesis: Secondary | ICD-10-CM | POA: Insufficient documentation

## 2023-08-26 DIAGNOSIS — I129 Hypertensive chronic kidney disease with stage 1 through stage 4 chronic kidney disease, or unspecified chronic kidney disease: Secondary | ICD-10-CM | POA: Insufficient documentation

## 2023-08-26 DIAGNOSIS — G4730 Sleep apnea, unspecified: Secondary | ICD-10-CM | POA: Insufficient documentation

## 2023-08-26 DIAGNOSIS — Z79899 Other long term (current) drug therapy: Secondary | ICD-10-CM | POA: Insufficient documentation

## 2023-08-26 MED ORDER — CHLORHEXIDINE GLUCONATE CLOTH 2 % EX PADS: 6.0000 | MEDICATED_PAD | Freq: Once | CUTANEOUS | Status: DC

## 2023-08-26 MED ORDER — ENSURE PRE-SURGERY PO LIQD: 296.0000 mL | Freq: Once | ORAL | Status: DC

## 2023-08-26 NOTE — Progress Notes (Signed)
 New Breast Cancer Diagnosis: Right Breast  Patient presented for screening mammogram that showed a mass in the right breast.  Targeted ultrasound demonstrated the mass in the 9 o'clock position measuring 1.4 cm.   Histology per Pathology Report: grade 2, Invasive Ductal Carcinoma with associated intermediate grade DCIS. 07/31/2023  Receptor Status: ER(positive), PR (negative), Her2-neu (negative), Ki-(3%)   Surgeon and surgical plan, if any:  Dr. Lucienne Ryder -Right Breast lumpectomy with radioactive seed localization 09/01/2023   Medical oncologist, treatment if any:   Dr. Lee Public 08/20/2023 Recommendations: 1. Breast conserving surgery followed by 2. Oncotype DX testing to determine if chemotherapy would be of any benefit followed by 3. Adjuvant radiation therapy followed by 4. Adjuvant antiestrogen therapy   Family History of Breast/Ovarian/Prostate Cancer: No  Lymphedema issues, if any: No     Pain issues, if any: No     SAFETY ISSUES: Prior radiation? No Pacemaker/ICD? No   Possible current pregnancy? Hysterectomy Is the patient on methotrexate? No  Current Complaints / other details:

## 2023-08-26 NOTE — Progress Notes (Signed)

## 2023-08-29 ENCOUNTER — Ambulatory Visit
Admission: RE | Admit: 2023-08-29 | Discharge: 2023-08-29 | Disposition: A | Discharge status: Home / Self Care | Source: Ambulatory Visit | Attending: Surgery | Admitting: Surgery

## 2023-08-29 ENCOUNTER — Other Ambulatory Visit: Payer: Self-pay | Admitting: Surgery

## 2023-08-29 DIAGNOSIS — Z853 Personal history of malignant neoplasm of breast: Secondary | ICD-10-CM

## 2023-08-29 DIAGNOSIS — C50811 Malignant neoplasm of overlapping sites of right female breast: Secondary | ICD-10-CM | POA: Diagnosis not present

## 2023-08-29 DIAGNOSIS — R92321 Mammographic fibroglandular density, right breast: Secondary | ICD-10-CM | POA: Diagnosis not present

## 2023-08-29 HISTORY — PX: BREAST BIOPSY: SHX20

## 2023-08-31 NOTE — H&P (Signed)
 REFERRING PHYSICIAN: Nena Bank* PROVIDER: Debi Fall, MD MRN: [id number] DOB: [birth year] DATE OF ENCOUNTER: 08/15/2023 Subjective   Chief Complaint: new cancer (New breast ca )  History of Present Illness: Morgan Norman is a 68 y.o. female who is seen today as an office consultation for evaluation of new cancer (New breast ca )  This is a 68 year old female who was given return 10. She was found on recent screening mammography to have a mass in the right breast. She underwent an ultrasound showing the mass to measure 1.4 cm. It was at the 9 o'clock position 4 cm from the nipple. Ultrasound the axilla was negative. She underwent a biopsy of the mass showing invasive ductal carcinoma with DCIS. It is grade 2. It is 95% ER positive, PR negative, HER2 negative, and had a Ki-67 of 3%. She has had no previous problems regarding her breast. There is no family history of breast cancer. She has had no nipple discharge. She has had no previous problems regarding surgery in the past. She has no cardiopulmonary issues.  Review of Systems: A complete review of systems was obtained from the patient. I have reviewed this information and discussed as appropriate with the patient. See HPI as well for other ROS.  ROS   Medical History: History reviewed. No pertinent past medical history.  There is no problem list on file for this patient.  Past Surgical History:  Procedure Laterality Date  CESAREAN SECTION  HYSTERECTOMY    No Known Allergies  Current Outpatient Medications on File Prior to Visit  Medication Sig Dispense Refill  rosuvastatin  (CRESTOR ) 10 MG tablet Take 10 mg by mouth once daily  telmisartan  (MICARDIS ) 20 MG tablet Take 20 mg by mouth once daily   No current facility-administered medications on file prior to visit.   Family History  Problem Relation Age of Onset  High blood pressure (Hypertension) Mother  Hyperlipidemia (Elevated cholesterol)  Mother  Diabetes Mother    Social History   Tobacco Use  Smoking Status Never  Smokeless Tobacco Never    Social History   Socioeconomic History  Marital status: Married  Tobacco Use  Smoking status: Never  Smokeless tobacco: Never  Substance and Sexual Activity  Alcohol use: Never  Drug use: Never   Social Drivers of Corporate investment banker Strain: Low Risk (07/22/2023)  Received from [facility]  Overall Financial Resource Strain (CARDIA)  Difficulty of Paying Living Expenses: Not hard at all  Food Insecurity: No Food Insecurity (07/22/2023)  [facility]  Hunger Vital Sign  Worried About Running Out of Food in the Last Year: Never true  Ran Out of Food in the Last Year: Never true  Transportation Needs: No Transportation Needs (07/22/2023)  Received from [facility] - Transportation  Lack of Transportation (Medical): No  Lack of Transportation (Non-Medical): No  Physical Activity: Inactive (07/22/2023)  Received from [facility]  Exercise Vital Sign  Days of Exercise per Week: 0 days  Minutes of Exercise per Session: 20 min  Stress: Stress Concern Present (07/22/2023)  Received from [facility] - Occupational Stress Questionnaire  Feeling of Stress : Rather much  Social Connections: Socially Integrated (07/22/2023)  Received from [facility]  Social Network  How would you rate your social network (family, work, friends)?: Good participation with social networks  Housing Stability: Unknown (08/15/2023)  Housing Stability Vital Sign  Homeless in the Last Year: No  Objective:   Vitals:  08/15/23 1007  BP: 130/78  Pulse: 68  Temp: 36.6 C (97.8 F)  SpO2: 97%  Weight: 77.7 kg (171 lb 6.4 oz)  Height: 167.6 cm (5\' 6" )  PainSc: 0-No pain   Body mass index is 27.66 kg/m.  Physical Exam   She appears well on exam  There are no palpable breast masses. There is minimal ecchymosis  from the breast biopsy on the right breast. The nipple areolar complex is normal.  There is no palpable axillary lymphadenopathy  Labs, Imaging and Diagnostic Testing: I have reviewed her mammograms, ultrasound, and pathology results  Assessment and Plan:   Diagnoses and all orders for this visit:  Invasive ductal carcinoma of breast, female, right (CMS/HHS-HCC) - Ambulatory Referral to Radiation Oncology - Ambulatory Referral to Oncology-Medical   I gave the patient and her husband a copy of the pathology results we discussed the findings in detail. We discussed the multidisciplinary approach of breast cancer. From a surgical standpoint we then discussed breast conservation with a lumpectomy and possible radiation versus mastectomy and the long-term results of each. She is interested in breast conservation. I next discussed proceeding with a radioactive seed guided right breast lumpectomy. I explained the surgical procedure in detail. We discussed the risks which includes but is not limited to bleeding, infection, injury to surrounding structures, the need for further surgery if margins are positive, cardiopulmonary issues with anesthesia, postoperative recovery, etc.

## 2023-09-01 ENCOUNTER — Ambulatory Visit
Admission: RE | Admit: 2023-09-01 | Discharge: 2023-09-01 | Disposition: A | Discharge status: Home / Self Care | Source: Ambulatory Visit | Attending: Surgery | Admitting: Surgery

## 2023-09-01 ENCOUNTER — Other Ambulatory Visit: Payer: Self-pay

## 2023-09-01 ENCOUNTER — Ambulatory Visit (HOSPITAL_BASED_OUTPATIENT_CLINIC_OR_DEPARTMENT_OTHER): Admitting: Anesthesiology

## 2023-09-01 ENCOUNTER — Encounter (HOSPITAL_BASED_OUTPATIENT_CLINIC_OR_DEPARTMENT_OTHER)
Admission: RE | Disposition: A | Discharge status: Home / Self Care | Payer: Self-pay | Source: Home / Self Care | Attending: Surgery

## 2023-09-01 ENCOUNTER — Encounter (HOSPITAL_BASED_OUTPATIENT_CLINIC_OR_DEPARTMENT_OTHER): Payer: Self-pay | Admitting: Surgery

## 2023-09-01 ENCOUNTER — Ambulatory Visit (HOSPITAL_BASED_OUTPATIENT_CLINIC_OR_DEPARTMENT_OTHER)
Admission: RE | Admit: 2023-09-01 | Discharge: 2023-09-01 | Disposition: A | Discharge status: Home / Self Care | Attending: Surgery | Admitting: Surgery

## 2023-09-01 DIAGNOSIS — Z79899 Other long term (current) drug therapy: Secondary | ICD-10-CM | POA: Insufficient documentation

## 2023-09-01 DIAGNOSIS — Z1732 Human epidermal growth factor receptor 2 negative status: Secondary | ICD-10-CM | POA: Insufficient documentation

## 2023-09-01 DIAGNOSIS — C50811 Malignant neoplasm of overlapping sites of right female breast: Secondary | ICD-10-CM | POA: Diagnosis not present

## 2023-09-01 DIAGNOSIS — I10 Essential (primary) hypertension: Secondary | ICD-10-CM | POA: Insufficient documentation

## 2023-09-01 DIAGNOSIS — G4730 Sleep apnea, unspecified: Secondary | ICD-10-CM | POA: Diagnosis not present

## 2023-09-01 DIAGNOSIS — Z01818 Encounter for other preprocedural examination: Secondary | ICD-10-CM

## 2023-09-01 DIAGNOSIS — Z1722 Progesterone receptor negative status: Secondary | ICD-10-CM | POA: Diagnosis not present

## 2023-09-01 DIAGNOSIS — K219 Gastro-esophageal reflux disease without esophagitis: Secondary | ICD-10-CM | POA: Insufficient documentation

## 2023-09-01 DIAGNOSIS — Z170 Estrogen receptor positive status [ER+]: Secondary | ICD-10-CM | POA: Diagnosis not present

## 2023-09-01 DIAGNOSIS — R238 Other skin changes: Secondary | ICD-10-CM | POA: Diagnosis not present

## 2023-09-01 DIAGNOSIS — D241 Benign neoplasm of right breast: Secondary | ICD-10-CM | POA: Diagnosis not present

## 2023-09-01 DIAGNOSIS — Z1731 Human epidermal growth factor receptor 2 positive status: Secondary | ICD-10-CM | POA: Diagnosis not present

## 2023-09-01 DIAGNOSIS — N62 Hypertrophy of breast: Secondary | ICD-10-CM | POA: Diagnosis not present

## 2023-09-01 DIAGNOSIS — C50911 Malignant neoplasm of unspecified site of right female breast: Secondary | ICD-10-CM | POA: Diagnosis not present

## 2023-09-01 DIAGNOSIS — Z853 Personal history of malignant neoplasm of breast: Secondary | ICD-10-CM

## 2023-09-01 HISTORY — DX: Sleep apnea, unspecified: G47.30

## 2023-09-01 HISTORY — PX: BREAST LUMPECTOMY WITH RADIOACTIVE SEED LOCALIZATION: SHX6424

## 2023-09-01 SURGERY — BREAST LUMPECTOMY WITH RADIOACTIVE SEED LOCALIZATION
Anesthesia: General | Site: Breast | Laterality: Right

## 2023-09-01 MED ORDER — DEXAMETHASONE SODIUM PHOSPHATE 10 MG/ML IJ SOLN
INTRAMUSCULAR | Status: AC
  Filled 2023-09-01: qty 1

## 2023-09-01 MED ORDER — BUPIVACAINE-EPINEPHRINE 0.5% -1:200000 IJ SOLN
INTRAMUSCULAR | Status: DC | PRN
  Administered 2023-09-01: 20 mL

## 2023-09-01 MED ORDER — OXYCODONE HCL 5 MG/5ML PO SOLN: 5.0000 mg | Freq: Once | ORAL | Status: DC | PRN

## 2023-09-01 MED ORDER — EPHEDRINE 5 MG/ML INJ
INTRAVENOUS | Status: AC
  Filled 2023-09-01: qty 5

## 2023-09-01 MED ORDER — FENTANYL CITRATE (PF) 100 MCG/2ML IJ SOLN
INTRAMUSCULAR | Status: AC
  Filled 2023-09-01: qty 2

## 2023-09-01 MED ORDER — CEFAZOLIN SODIUM-DEXTROSE 2-4 GM/100ML-% IV SOLN
INTRAVENOUS | Status: AC
  Filled 2023-09-01: qty 100

## 2023-09-01 MED ORDER — ACETAMINOPHEN 500 MG PO TABS
ORAL_TABLET | ORAL | Status: AC
  Filled 2023-09-01: qty 2

## 2023-09-01 MED ORDER — DEXAMETHASONE SODIUM PHOSPHATE 10 MG/ML IJ SOLN
INTRAMUSCULAR | Status: DC | PRN
  Administered 2023-09-01: 5 mg via INTRAVENOUS

## 2023-09-01 MED ORDER — PROPOFOL 10 MG/ML IV BOLUS
INTRAVENOUS | Status: DC | PRN
  Administered 2023-09-01: 150 mg via INTRAVENOUS
  Administered 2023-09-01: 50 mg via INTRAVENOUS

## 2023-09-01 MED ORDER — LACTATED RINGERS IV SOLN: INTRAVENOUS | Status: DC

## 2023-09-01 MED ORDER — PROPOFOL 10 MG/ML IV BOLUS
INTRAVENOUS | Status: AC
Start: 2023-09-01 — End: ?
  Filled 2023-09-01: qty 20

## 2023-09-01 MED ORDER — ONDANSETRON HCL 4 MG/2ML IJ SOLN
INTRAMUSCULAR | Status: AC
Start: 2023-09-01 — End: ?
  Filled 2023-09-01: qty 2

## 2023-09-01 MED ORDER — TRAMADOL HCL 50 MG PO TABS
50.0000 mg | ORAL_TABLET | Freq: Four times a day (QID) | ORAL | 0 refills | Status: AC | PRN
Start: 2023-09-01 — End: ?

## 2023-09-01 MED ORDER — LIDOCAINE HCL (CARDIAC) PF 100 MG/5ML IV SOSY
PREFILLED_SYRINGE | INTRAVENOUS | Status: DC | PRN
  Administered 2023-09-01: 40 mg via INTRAVENOUS

## 2023-09-01 MED ORDER — ACETAMINOPHEN 500 MG PO TABS
1000.0000 mg | ORAL_TABLET | ORAL | Status: AC
  Administered 2023-09-01: 1000 mg via ORAL

## 2023-09-01 MED ORDER — LIDOCAINE 2% (20 MG/ML) 5 ML SYRINGE
INTRAMUSCULAR | Status: AC
  Filled 2023-09-01: qty 10

## 2023-09-01 MED ORDER — MIDAZOLAM HCL 5 MG/5ML IJ SOLN
INTRAMUSCULAR | Status: DC | PRN
  Administered 2023-09-01: 2 mg via INTRAVENOUS

## 2023-09-01 MED ORDER — FENTANYL CITRATE (PF) 100 MCG/2ML IJ SOLN
INTRAMUSCULAR | Status: DC | PRN
  Administered 2023-09-01: 25 ug via INTRAVENOUS

## 2023-09-01 MED ORDER — EPHEDRINE SULFATE (PRESSORS) 50 MG/ML IJ SOLN
INTRAMUSCULAR | Status: DC | PRN
  Administered 2023-09-01: 5 mg via INTRAVENOUS
  Administered 2023-09-01 (×2): 10 mg via INTRAVENOUS

## 2023-09-01 MED ORDER — CEFAZOLIN SODIUM-DEXTROSE 2-4 GM/100ML-% IV SOLN
2.0000 g | INTRAVENOUS | Status: AC
Start: 2023-09-01 — End: 2023-09-01
  Administered 2023-09-01: 2 g via INTRAVENOUS

## 2023-09-01 MED ORDER — OXYCODONE HCL 5 MG PO TABS: 5.0000 mg | ORAL_TABLET | Freq: Once | ORAL | Status: DC | PRN

## 2023-09-01 MED ORDER — ACETAMINOPHEN 10 MG/ML IV SOLN: 1000.0000 mg | Freq: Once | INTRAVENOUS | Status: DC | PRN

## 2023-09-01 MED ORDER — ONDANSETRON HCL 4 MG/2ML IJ SOLN
INTRAMUSCULAR | Status: DC | PRN
  Administered 2023-09-01: 4 mg via INTRAVENOUS

## 2023-09-01 MED ORDER — MIDAZOLAM HCL 2 MG/2ML IJ SOLN
INTRAMUSCULAR | Status: AC
  Filled 2023-09-01: qty 2

## 2023-09-01 MED ORDER — FENTANYL CITRATE (PF) 100 MCG/2ML IJ SOLN: 25.0000 ug | INTRAMUSCULAR | Status: DC | PRN

## 2023-09-01 MED ORDER — DROPERIDOL 2.5 MG/ML IJ SOLN: 0.6250 mg | Freq: Once | INTRAMUSCULAR | Status: DC | PRN

## 2023-09-01 SURGICAL SUPPLY — 35 items
BINDER BREAST LRG (GAUZE/BANDAGES/DRESSINGS) IMPLANT
BINDER BREAST XLRG (GAUZE/BANDAGES/DRESSINGS) IMPLANT
BINDER BREAST XXLRG (GAUZE/BANDAGES/DRESSINGS) IMPLANT
BLADE SURG 15 STRL LF DISP TIS (BLADE) ×1 IMPLANT
CANISTER SUC SOCK COL 7IN (MISCELLANEOUS) IMPLANT
CANISTER SUCT 1200ML W/VALVE (MISCELLANEOUS) IMPLANT
CHLORAPREP W/TINT 26 (MISCELLANEOUS) ×1 IMPLANT
CLIP APPLIE 9.375 MED OPEN (MISCELLANEOUS) IMPLANT
COVER BACK TABLE 60X90IN (DRAPES) ×1 IMPLANT
COVER MAYO STAND STRL (DRAPES) ×1 IMPLANT
COVER PROBE CYLINDRICAL 5X96 (MISCELLANEOUS) ×1 IMPLANT
DERMABOND ADVANCED .7 DNX12 (GAUZE/BANDAGES/DRESSINGS) ×1 IMPLANT
DRAPE LAPAROSCOPIC ABDOMINAL (DRAPES) ×1 IMPLANT
DRAPE UTILITY XL STRL (DRAPES) ×1 IMPLANT
ELECTRODE REM PT RTRN 9FT ADLT (ELECTROSURGICAL) ×1 IMPLANT
GAUZE SPONGE 4X4 12PLY STRL LF (GAUZE/BANDAGES/DRESSINGS) IMPLANT
GLOVE SURG SIGNA 7.5 PF LTX (GLOVE) ×1 IMPLANT
GOWN STRL REUS W/ TWL LRG LVL3 (GOWN DISPOSABLE) ×1 IMPLANT
GOWN STRL REUS W/ TWL XL LVL3 (GOWN DISPOSABLE) ×1 IMPLANT
KIT MARKER MARGIN INK (KITS) ×1 IMPLANT
NDL HYPO 25X1 1.5 SAFETY (NEEDLE) ×1 IMPLANT
NEEDLE HYPO 25X1 1.5 SAFETY (NEEDLE) ×1 IMPLANT
NS IRRIG 1000ML POUR BTL (IV SOLUTION) IMPLANT
PACK BASIN DAY SURGERY FS (CUSTOM PROCEDURE TRAY) ×1 IMPLANT
PENCIL SMOKE EVACUATOR (MISCELLANEOUS) ×1 IMPLANT
SLEEVE SCD COMPRESS KNEE MED (STOCKING) ×1 IMPLANT
SPONGE T-LAP 4X18 ~~LOC~~+RFID (SPONGE) ×1 IMPLANT
SUT MNCRL AB 4-0 PS2 18 (SUTURE) ×1 IMPLANT
SUT SILK 2 0 SH (SUTURE) IMPLANT
SUT VIC AB 3-0 SH 27X BRD (SUTURE) ×1 IMPLANT
SYR CONTROL 10ML LL (SYRINGE) ×1 IMPLANT
TOWEL GREEN STERILE FF (TOWEL DISPOSABLE) ×1 IMPLANT
TRAY FAXITRON CT DISP (TRAY / TRAY PROCEDURE) ×1 IMPLANT
TUBE CONNECTING 20X1/4 (TUBING) IMPLANT
YANKAUER SUCT BULB TIP NO VENT (SUCTIONS) IMPLANT

## 2023-09-01 NOTE — Anesthesia Procedure Notes (Signed)
 Procedure Name: LMA Insertion Date/Time: 09/01/2023 8:53 AM  Performed by: Noralyn Beams, CRNAPre-anesthesia Checklist: Patient identified, Emergency Drugs available, Suction available and Patient being monitored Patient Re-evaluated:Patient Re-evaluated prior to induction Oxygen Delivery Method: Circle system utilized Preoxygenation: Pre-oxygenation with 100% oxygen Induction Type: IV induction Ventilation: Mask ventilation without difficulty LMA: LMA inserted LMA Size: 4.0 Number of attempts: 1 Airway Equipment and Method: Bite block Placement Confirmation: positive ETCO2 Tube secured with: Tape Dental Injury: Teeth and Oropharynx as per pre-operative assessment

## 2023-09-01 NOTE — Op Note (Signed)
   Morgan Norman 09/01/2023   Pre-op Diagnosis: RIGHT BREAST CANCER     Post-op Diagnosis: samd  Procedure(s): RADIOACTIVE SEED GUIDED RIGHT BREAST LUMPECTOMY   Surgeon(s): [name], MD  Anesthesia: General  Staff:  Circulator: Altamease Asters, RN Relief Circulator: Carren Civatte, RN Scrub Person: Gaynell Keeler  Estimated Blood Loss: Minimal               Specimens: sent to path  Indications: This is a 68 year old female recently diagnosed with a right breast mass.  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  The decision was made to proceed with a radioactive seed guided right breast lumpectomy  Procedure: The patient was brought to the operating room and identified the correct patient.  She was placed upon the operating table and general anesthesia was induced.  Her right breast was prepped and draped in the usual sterile fashion.  Using the neoprobe, the radioactive seed was located at the 9 o'clock position of the right breast several inches from the nipple.  I anesthetized the skin over this area with Marcaine and then made a longitudinal incision with a scalpel.  I then dissected down into the breast tissue with electrocautery.  With the aid of the neoprobe I then dissected circumferentially around the signal from the radioactive seed.  I took the dissection down to the chest wall.  I then completely excised the lumpectomy specimen in all directions taking off the chest wall.  I again confirmed the radioactive seed was in the specimen with the neoprobe.  I marked all margins with paint.  An x-ray on the specimen confirmed the radioactive seed and previous biopsy clip were in the specimen.  The specimen was then sent to pathology for evaluation.  I achieved hemostasis with the cautery.  I placed surgical clips around the periphery of the lumpectomy cavity.  I anesthetized the incision further with Marcaine.  I then closed  the subcutaneous tissue with interrupted 3-0 Vicryl sutures and closed the skin with a running 4-0 Monocryl.  Dermabond was then applied.  The patient was placed in a breast binder.  She tolerated the procedure well.  All the counts were correct at the end of the procedure.  She was then taken in a stable condition from the operating room to the recovery room.          [name]   Date: 09/01/2023  Time: 9:31 AM

## 2023-09-01 NOTE — Transfer of Care (Signed)
 Immediate Anesthesia Transfer of Care Note  Patient: Morgan Norman  Procedure(s) Performed: BREAST LUMPECTOMY WITH RADIOACTIVE SEED LOCALIZATION (Right: Breast)  Patient Location: PACU  Anesthesia Type:General  Level of Consciousness: awake, alert , and oriented  Airway & Oxygen Therapy: Patient Spontanous Breathing and Patient connected to face mask oxygen  Post-op Assessment: Report given to RN and Post -op Vital signs reviewed and stable  Post vital signs: Reviewed and stable  Last Vitals:  Vitals Value Taken Time  BP 115/58 (75)   Temp    Pulse 84 09/01/23 0938  Resp 22   SpO2 100 % 09/01/23 0938  Vitals shown include unfiled device data.  Last Pain:  Vitals:   09/01/23 0710  TempSrc: Temporal  PainSc: 0-No pain      Patients Stated Pain Goal: 3 (09/01/23 0710)  Complications: No notable events documented.

## 2023-09-01 NOTE — Anesthesia Preprocedure Evaluation (Addendum)
 Anesthesia Evaluation  Patient identified by MRN, date of birth, ID band Patient awake    Reviewed: Allergy & Precautions, NPO status , Patient's Chart, lab work & pertinent test results  Airway Mallampati: II  TM Distance: >3 FB Neck ROM: Full    Dental  (+) Teeth Intact, Dental Advisory Given   Pulmonary sleep apnea    breath sounds clear to auscultation       Cardiovascular hypertension, Pt. on medications  Rhythm:Regular Rate:Normal     Neuro/Psych  Headaches  negative psych ROS   GI/Hepatic Neg liver ROS,GERD  Medicated,,  Endo/Other  negative endocrine ROS    Renal/GU Renal disease     Musculoskeletal negative musculoskeletal ROS (+)    Abdominal   Peds  Hematology negative hematology ROS (+)   Anesthesia Other Findings   Reproductive/Obstetrics                             Anesthesia Physical Anesthesia Plan  ASA: 1  Anesthesia Plan: General   Post-op Pain Management: Tylenol PO (pre-op)*   Induction: Intravenous  PONV Risk Score and Plan: 4 or greater and Ondansetron , Dexamethasone  and Treatment may vary due to age or medical condition  Airway Management Planned: LMA  Additional Equipment: None  Intra-op Plan:   Post-operative Plan: Extubation in OR  Informed Consent: I have reviewed the patients History and Physical, chart, labs and discussed the procedure including the risks, benefits and alternatives for the proposed anesthesia with the patient or authorized representative who has indicated his/her understanding and acceptance.     Dental advisory given  Plan Discussed with: CRNA  Anesthesia Plan Comments:         Anesthesia Quick Evaluation

## 2023-09-01 NOTE — Discharge Instructions (Signed)
 [facility] Office Phone Number [phone]  BREAST BIOPSY/ PARTIAL MASTECTOMY: POST OP INSTRUCTIONS  Always review your discharge instruction sheet given to you by the facility where your surgery was performed.  IF YOU HAVE DISABILITY OR FAMILY LEAVE FORMS, YOU MUST BRING THEM TO THE OFFICE FOR PROCESSING.  DO NOT GIVE THEM TO YOUR DOCTOR.  A prescription for pain medication may be given to you upon discharge.  Take your pain medication as prescribed, if needed.  If narcotic pain medicine is not needed, then you may take acetaminophen (Tylenol) or ibuprofen (Advil) as needed. Take your usually prescribed medications unless otherwise directed If you need a refill on your pain medication, please contact your pharmacy.  They will contact our office to request authorization.  Prescriptions will not be filled after 5pm or on week-ends. You should eat very light the first 24 hours after surgery, such as soup, crackers, pudding, etc.  Resume your normal diet the day after surgery. Most patients will experience some swelling and bruising in the breast.  Ice packs and a good support bra will help.  Swelling and bruising can take several days to resolve.  It is common to experience some constipation if taking pain medication after surgery.  Increasing fluid intake and taking a stool softener will usually help or prevent this problem from occurring.  A mild laxative (Milk of Magnesia or Miralax) should be taken according to package directions if there are no bowel movements after 48 hours. Unless discharge instructions indicate otherwise, you may remove your bandages 24-48 hours after surgery, and you may shower at that time.  You may have steri-strips (small skin tapes) in place directly over the incision.  These strips should be left on the skin for 7-10 days.  If your surgeon used skin glue on the incision, you may shower in 24 hours.  The glue will flake off over the next 2-3 weeks.  Any  sutures or staples will be removed at the office during your follow-up visit. ACTIVITIES:  You may resume regular daily activities (gradually increasing) beginning the next day.  Wearing a good support bra or sports bra minimizes pain and swelling.  You may have sexual intercourse when it is comfortable. You may drive when you no longer are taking prescription pain medication, you can comfortably wear a seatbelt, and you can safely maneuver your car and apply brakes. RETURN TO WORK:  ______________________________________________________________________________________ Morgan Norman should see your doctor in the office for a follow-up appointment approximately two weeks after your surgery.  Your doctor's nurse will typically make your follow-up appointment when she calls you with your pathology report.  Expect your pathology report 2-3 business days after your surgery.  You may call to check if you do not hear from us  after three days. OTHER INSTRUCTIONS: YOU MAY REMOVE THE BINDER AND SHOWER STARTING TOMORROW AND THEN WEAR WHAT EVER MAKES YOU THE MOST COMFORTABLE ICE PACK, TYLENOL, AND IBUPROFEN ALSO FOR PAIN NO VIGOROUS ACTIVITY FOR 1 WEEK _______________________________________________________________________________________________ _____________________________________________________________________________________________________________________________________ _____________________________________________________________________________________________________________________________________ _____________________________________________________________________________________________________________________________________  WHEN TO CALL YOUR DOCTOR: Fever over 101.0 Nausea and/or vomiting. Extreme swelling or bruising. Continued bleeding from incision. Increased pain, redness, or drainage from the incision.  The clinic staff is available to answer your questions during regular business hours.  Please  don't hesitate to call and ask to speak to one of the nurses for clinical concerns.  If you have a medical emergency, go to the nearest emergency room or call 911.  A Careers adviser from [facility] is always on call at the hospital.  For further questions, please visit [email or web address]

## 2023-09-01 NOTE — Interval H&P Note (Signed)
 History and Physical Interval Note:no change in H and P  09/01/2023 7:15 AM  Morgan Norman  has presented today for surgery, with the diagnosis of RIGHT BREAST CANCER.  The various methods of treatment have been discussed with the patient and family. After consideration of risks, benefits and other options for treatment, the patient has consented to  Procedure(s) with comments: BREAST LUMPECTOMY WITH RADIOACTIVE SEED LOCALIZATION (Right) - LMA RADIOACTIVE SEED GUIDED RIGHT BREAST LUMPECTOMY as a surgical intervention.  The patient's history has been reviewed, patient examined, no change in status, stable for surgery.  I have reviewed the patient's chart and labs.  Questions were answered to the patient's satisfaction.     [name]

## 2023-09-01 NOTE — Anesthesia Postprocedure Evaluation (Signed)
 Anesthesia Post Note  Patient: Morgan Norman  Procedure(s) Performed: BREAST LUMPECTOMY WITH RADIOACTIVE SEED LOCALIZATION (Right: Breast)     Patient location during evaluation: PACU Anesthesia Type: General Level of consciousness: awake and alert Pain management: pain level controlled Vital Signs Assessment: post-procedure vital signs reviewed and stable Respiratory status: spontaneous breathing, nonlabored ventilation, respiratory function stable and patient connected to nasal cannula oxygen Cardiovascular status: blood pressure returned to baseline and stable Postop Assessment: no apparent nausea or vomiting Anesthetic complications: no  No notable events documented.  Last Vitals:  Vitals:   09/01/23 0956 09/01/23 1025  BP: 123/75 129/67  Pulse:  95  Resp:  20  Temp: (!) 36.1 C (!) 36.1 C  SpO2:  99%    Last Pain:  Vitals:   09/01/23 1025  TempSrc: Temporal  PainSc: 0-No pain                 Willian Harrow

## 2023-09-02 ENCOUNTER — Other Ambulatory Visit: Payer: Self-pay | Admitting: Internal Medicine

## 2023-09-02 ENCOUNTER — Encounter (HOSPITAL_BASED_OUTPATIENT_CLINIC_OR_DEPARTMENT_OTHER): Payer: Self-pay | Admitting: Surgery

## 2023-09-04 ENCOUNTER — Inpatient Hospital Stay: Attending: Hematology and Oncology | Admitting: Licensed Clinical Social Worker

## 2023-09-04 NOTE — Progress Notes (Signed)
 [facility] Clinical Social Work  Initial Assessment   Morgan Norman is a 68 y.o. year old female contacted by phone. Clinical Social Work was referred by new patient protocol for assessment of psychosocial needs.   SDOH (Social Determinants of Health) assessments performed: Yes SDOH Interventions    Flowsheet Row Clinical Support from 09/04/2023 in [facility] [place] Clinical Support from 11/28/2022 in [facility] Clinical Support from 10/07/2019 in [facility]  SDOH Interventions     Food Insecurity Interventions Intervention Not Indicated Intervention Not Indicated --  Housing Interventions Intervention Not Indicated Intervention Not Indicated --  Transportation Interventions Intervention Not Indicated -- --  Utilities Interventions Intervention Not Indicated Intervention Not Indicated --  Alcohol Usage Interventions -- Intervention Not Indicated (Score <7) --  Depression Interventions/Treatment  -- PHQ2-9 Score <4 Follow-up Not Indicated PHQ2-9 Score <4 Follow-up Not Indicated  Financial Strain Interventions -- Intervention Not Indicated --  Physical Activity Interventions -- Other (Comments) --  Stress Interventions -- Intervention Not Indicated --  Social Connections Interventions -- Intervention Not Indicated --  Health Literacy Interventions -- Intervention Not Indicated --       SDOH Screenings   Food Insecurity: No Food Insecurity (09/04/2023)  Housing: Low Risk  (09/04/2023)  Transportation Needs: No Transportation Needs (09/04/2023)  Utilities: Not At Risk (09/04/2023)  Alcohol Screen: Low Risk  (05/01/2023)  Depression (PHQ2-9): Low Risk  (07/10/2023)  Financial Resource Strain: Low Risk  (07/22/2023)   Received from [facility]  Physical Activity: Inactive (07/22/2023)   Received from [facility]  Social Connections: Socially Integrated (07/22/2023)    Received from [facility]  Stress: Stress Concern Present (07/22/2023)   Received from [facility]  Tobacco Use: Low Risk  (09/01/2023)  Health Literacy: Adequate Health Literacy (11/28/2022)     Distress Screen completed: No     No data to display            Family/Social Information:  Housing Arrangement: patient lives with spouse Family members/support persons in your life? Family and Friends Transportation concerns: no  Employment: Retired from working for [employer].  Income source: Actor concerns: No Type of concern: None Food access concerns: no Religious or spiritual practice: Morgan Norman is important to her and helps her cope Advanced directives: Not known Services Currently in place:  [employer]  Coping/ Adjustment to diagnosis: Patient understands treatment plan and what happens next? yes, had surgery on 5/12 and is resting and recovering Concerns about diagnosis and/or treatment: I'm not especially worried about anything Patient reported stressors: Adjusting to my illness Patient enjoys watching birds and outside Current coping skills/ strengths: Ability for insight , Capable of independent living , Communication skills , Religious Affiliation , and Supportive family/friends     SUMMARY: Current SDOH Barriers:  No major barriers identified today  Clinical Social Work Clinical Goal(s):  No clinical social work goals at this time  Interventions: Discussed common feeling and emotions when being diagnosed with cancer, and the importance of support during treatment Informed patient of the support team roles and support services at [facility] Provided CSW contact information and encouraged patient to call with any questions or concerns   Follow Up Plan: Patient will contact CSW with any support or resource needs Patient verbalizes understanding of plan: Yes    Navi Ewton E Kolbie Clarkston, LCSW Clinical Social Worker [facility]

## 2023-09-08 LAB — SURGICAL PATHOLOGY

## 2023-09-09 ENCOUNTER — Telehealth: Payer: Self-pay | Admitting: *Deleted

## 2023-09-09 ENCOUNTER — Encounter: Payer: Self-pay | Admitting: *Deleted

## 2023-09-09 NOTE — Telephone Encounter (Signed)
 Received order for Oncotype Testing per Dr. Pamelia Hoit. Requisition faxed to pathology and [facility]

## 2023-09-18 ENCOUNTER — Encounter (HOSPITAL_COMMUNITY): Payer: Self-pay

## 2023-09-18 DIAGNOSIS — C50411 Malignant neoplasm of upper-outer quadrant of right female breast: Secondary | ICD-10-CM | POA: Diagnosis not present

## 2023-09-18 DIAGNOSIS — Z170 Estrogen receptor positive status [ER+]: Secondary | ICD-10-CM | POA: Diagnosis not present

## 2023-09-19 DIAGNOSIS — R059 Cough, unspecified: Secondary | ICD-10-CM | POA: Diagnosis not present

## 2023-09-19 DIAGNOSIS — J069 Acute upper respiratory infection, unspecified: Secondary | ICD-10-CM | POA: Diagnosis not present

## 2023-09-23 ENCOUNTER — Encounter: Payer: Self-pay | Admitting: *Deleted

## 2023-09-23 ENCOUNTER — Telehealth: Payer: Self-pay | Admitting: *Deleted

## 2023-09-23 DIAGNOSIS — Z170 Estrogen receptor positive status [ER+]: Secondary | ICD-10-CM

## 2023-09-23 NOTE — Telephone Encounter (Signed)
Received oncotype results of 16/4%. Referral placed for Dr. Mitzi Hansen

## 2023-09-29 ENCOUNTER — Encounter: Payer: Self-pay | Admitting: *Deleted

## 2023-09-29 ENCOUNTER — Inpatient Hospital Stay: Attending: Hematology and Oncology | Admitting: Hematology and Oncology

## 2023-09-29 VITALS — BP 121/76 | HR 72 | Temp 97.6°F | Resp 17 | Ht 65.0 in | Wt 167.1 lb

## 2023-09-29 DIAGNOSIS — Z1732 Human epidermal growth factor receptor 2 negative status: Secondary | ICD-10-CM | POA: Diagnosis not present

## 2023-09-29 DIAGNOSIS — C50411 Malignant neoplasm of upper-outer quadrant of right female breast: Secondary | ICD-10-CM | POA: Diagnosis not present

## 2023-09-29 DIAGNOSIS — Z1721 Progesterone receptor positive status: Secondary | ICD-10-CM | POA: Insufficient documentation

## 2023-09-29 DIAGNOSIS — Z170 Estrogen receptor positive status [ER+]: Secondary | ICD-10-CM | POA: Insufficient documentation

## 2023-09-29 DIAGNOSIS — Z79899 Other long term (current) drug therapy: Secondary | ICD-10-CM | POA: Diagnosis not present

## 2023-09-29 NOTE — Assessment & Plan Note (Signed)
 09/01/2023: Right lumpectomy: 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 I discussed the margins as well as lymph node surgeries. We also discussed the final staging along with previously performed ER/PR and HER-2/neu testing.  Treatment plan: Adjuvant radiation therapy Adjuvant antiestrogen therapy  Return to clinic after radiation is completed

## 2023-09-29 NOTE — Progress Notes (Signed)
 Patient Care Team: Cleave Curling, MD as PCP - General (Internal Medicine) Alane Hsu, RN as Oncology Nurse Navigator Auther Bo, RN as Registered Nurse Cameron Cea, MD as Consulting Physician (Hematology and Oncology) [name], MD as Consulting Physician (General Surgery)  DIAGNOSIS:  Encounter Diagnosis  Name Primary?   Malignant neoplasm of upper-outer quadrant of right breast in female, estrogen receptor positive (HCC) Yes    SUMMARY OF ONCOLOGIC HISTORY: Oncology History  Malignant neoplasm of upper-outer quadrant of right breast in female, estrogen receptor positive (HCC)  07/31/2023 Initial Diagnosis   Screening mammogram detected right breast mass 1.4 cm by ultrasound at 9 o'clock position 4 cm from the nipple, no axillary lymph nodes, biopsy: Grade 2 IDC with intermediate grade DCIS, ER 95%, PR 0%, Ki-67 3%, HER2 equivocal 2+ by IHC, FISH negative ratio 0.95   08/20/2023 Cancer Staging   Staging form: Breast, AJCC 8th Edition - Clinical: Stage IA (cT1c, cN0, cM0, G2, ER+, PR-, HER2-) - Signed by Cameron Cea, MD on 08/20/2023 Stage prefix: Initial diagnosis Histologic grading system: 3 grade system     CHIEF COMPLIANT: Follow-up after breast surgery  HISTORY OF PRESENT ILLNESS:  History of Present Illness Morgan Norman is a 68 year old female with breast cancer who presents for follow-up after surgery.  She underwent surgery for breast cancer, initially measured at 1.4 cm but found to be 2.6 cm during the procedure. The tumor was excised with a 1 mm margin, and the closest margin was negative. The DCIS margin was 0.1 mm, with no lymphovascular invasion.  The cancer cells are estrogen receptor positive (90%), progesterone receptor negative, and HER2 negative. The proliferation rate (KI-67) increased from 3% to 5%.  The Oncotype DX test score is 16, indicating the risk of distant recurrence.     ALLERGIES:  has no known  allergies.  MEDICATIONS:  Current Outpatient Medications  Medication Sig Dispense Refill   baclofen  (LIORESAL ) 10 MG tablet Take 1 tablet (10 mg total) by mouth at bedtime as needed for muscle spasms. 30 tablet 1   Cholecalciferol 5000 UNITS TABS Take 5,000 Units by mouth.     ELDERBERRY PO Take 1,000 mg by mouth.     loratadine (CLARITIN) 10 MG tablet Take 10 mg by mouth daily as needed for allergies.     meclizine  (ANTIVERT ) 25 MG tablet Take 1 tablet (25 mg total) by mouth 3 (three) times daily as needed for dizziness. 30 tablet 0   meloxicam (MOBIC) 15 MG tablet      Multiple Vitamins-Minerals (IMMUNE SUPPORT PO) Take 1,000 mg by mouth daily at 6 (six) AM.     nystatin  cream (MYCOSTATIN ) Apply topically 3 (three) times daily. 30 g 1   Omega-3 Fatty Acids (FISH OIL PO) Take 120 mg by mouth.     pantoprazole  (PROTONIX ) 20 MG tablet TAKE 1 TABLET(20 MG) BY MOUTH DAILY 90 tablet 2   Pramoxine-HC (HYDROCORTISONE  ACE-PRAMOXINE) 2.5-1 % CREA Apply 1 Application topically 2 (two) times daily as needed. 28.4 g 0   rosuvastatin  (CRESTOR ) 10 MG tablet TAKE 1 TABLET(10 MG) BY MOUTH DAILY 90 tablet 2   telmisartan  (MICARDIS ) 20 MG tablet TAKE 1 TABLET(20 MG) BY MOUTH DAILY 90 tablet 2   traMADol  (ULTRAM ) 50 MG tablet Take 1 tablet (50 mg total) by mouth every 6 (six) hours as needed for moderate pain (pain score 4-6) or severe pain (pain score 7-10). 25 tablet 0   triamcinolone  cream (KENALOG ) 0.1 % Apply  1 application. topically 2 (two) times daily. 30 g 1   valACYclovir  (VALTREX ) 500 MG tablet Take 1 tablet (500 mg total) by mouth daily. 90 tablet 1   No current facility-administered medications for this visit.    PHYSICAL EXAMINATION: ECOG PERFORMANCE STATUS: 1 - Symptomatic but completely ambulatory  Vitals:   09/29/23 0922  BP: 121/76  Pulse: 72  Resp: 17  Temp: 97.6 F (36.4 C)  SpO2: 98%   Filed Weights   09/29/23 0922  Weight: 167 lb 1.6 oz (75.8 kg)    Physical Exam    (exam performed in the presence of a chaperone)  LABORATORY DATA:  I have reviewed the data as listed    Latest Ref Rng & Units 07/10/2023    3:26 PM 03/11/2023   10:56 AM 12/05/2022   10:02 AM  CMP  Glucose 70 - 99 mg/dL 83  75  84   BUN 8 - 27 mg/dL 11  9  9    Creatinine 0.57 - 1.00 mg/dL 1.61  0.96  0.45   Sodium 134 - 144 mmol/L 140  140  141   Potassium 3.5 - 5.2 mmol/L 4.9  4.0  4.5   Chloride 96 - 106 mmol/L 101  101  103   CO2 20 - 29 mmol/L 26  26  24    Calcium  8.7 - 10.3 mg/dL 9.2  8.8  8.7   Total Protein 6.0 - 8.5 g/dL 7.3   6.7   Total Bilirubin 0.0 - 1.2 mg/dL 0.4   0.4   Alkaline Phos 44 - 121 IU/L 73   70   AST 0 - 40 IU/L 21   21   ALT 0 - 32 IU/L 11   9     Lab Results  Component Value Date   WBC 3.9 12/05/2022   HGB 12.5 12/05/2022   HCT 38.7 12/05/2022   MCV 91 12/05/2022   PLT 109 (L) 12/05/2022   NEUTROABS 2.0 10/07/2019    ASSESSMENT & PLAN:  Malignant neoplasm of upper-outer quadrant of right breast in female, estrogen receptor positive (HCC) 09/01/2023: Right lumpectomy: 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 I discussed the margins as well as lymph node surgeries. We also discussed the final staging along with previously performed ER/PR and HER-2/neu testing.  Treatment plan: Adjuvant radiation therapy Adjuvant antiestrogen therapy  Return to clinic after radiation is completed    No orders of the defined types were placed in this encounter.  The patient has a good understanding of the overall plan. she agrees with it. she will call with any problems that may develop before the next visit here. Total time spent:  30 mins including face to face time and time spent for planning, charting and co-ordination of care   Margert Sheerer, MD 09/29/23

## 2023-10-02 ENCOUNTER — Encounter: Payer: Self-pay | Admitting: Radiation Oncology

## 2023-10-02 ENCOUNTER — Ambulatory Visit
Admission: RE | Admit: 2023-10-02 | Discharge: 2023-10-02 | Disposition: A | Discharge status: Home / Self Care | Source: Ambulatory Visit | Attending: Radiation Oncology | Admitting: Radiation Oncology

## 2023-10-02 ENCOUNTER — Encounter: Payer: Self-pay | Admitting: *Deleted

## 2023-10-02 VITALS — BP 127/80 | Temp 97.8°F | Resp 16 | Ht 65.0 in | Wt 167.4 lb

## 2023-10-02 DIAGNOSIS — Z510 Encounter for antineoplastic radiation therapy: Secondary | ICD-10-CM | POA: Diagnosis not present

## 2023-10-02 DIAGNOSIS — C50411 Malignant neoplasm of upper-outer quadrant of right female breast: Secondary | ICD-10-CM | POA: Insufficient documentation

## 2023-10-02 DIAGNOSIS — Z8052 Family history of malignant neoplasm of bladder: Secondary | ICD-10-CM | POA: Diagnosis not present

## 2023-10-02 DIAGNOSIS — E559 Vitamin D deficiency, unspecified: Secondary | ICD-10-CM | POA: Diagnosis not present

## 2023-10-02 DIAGNOSIS — G4730 Sleep apnea, unspecified: Secondary | ICD-10-CM | POA: Diagnosis not present

## 2023-10-02 DIAGNOSIS — E7800 Pure hypercholesterolemia, unspecified: Secondary | ICD-10-CM | POA: Diagnosis not present

## 2023-10-02 DIAGNOSIS — Z170 Estrogen receptor positive status [ER+]: Secondary | ICD-10-CM | POA: Diagnosis not present

## 2023-10-02 DIAGNOSIS — Z79899 Other long term (current) drug therapy: Secondary | ICD-10-CM | POA: Diagnosis not present

## 2023-10-02 DIAGNOSIS — Z1722 Progesterone receptor negative status: Secondary | ICD-10-CM | POA: Diagnosis not present

## 2023-10-02 DIAGNOSIS — Z1732 Human epidermal growth factor receptor 2 negative status: Secondary | ICD-10-CM | POA: Insufficient documentation

## 2023-10-02 DIAGNOSIS — Z79624 Long term (current) use of inhibitors of nucleotide synthesis: Secondary | ICD-10-CM | POA: Diagnosis not present

## 2023-10-02 DIAGNOSIS — I129 Hypertensive chronic kidney disease with stage 1 through stage 4 chronic kidney disease, or unspecified chronic kidney disease: Secondary | ICD-10-CM | POA: Insufficient documentation

## 2023-10-02 DIAGNOSIS — N182 Chronic kidney disease, stage 2 (mild): Secondary | ICD-10-CM | POA: Insufficient documentation

## 2023-10-02 NOTE — Progress Notes (Signed)
 New Breast Cancer Diagnosis: Right Breast  Morgan Norman was seen in our office in May 2025.  She has completed her lumpectomy on 09/01/2023 and is here today to discuss adjuvant radiation therapy.   Histology per Pathology Report: grade 2, Invasive Ductal Carcinoma   Receptor Status: ER(positive), PR (negative), Her2-neu (negative), Ki-(5%)   Surgeon and surgical plan, if any:  Dr. Lucienne Ryder -Lumpectomy 09/01/2023 -Follow-up last week   Medical oncologist, treatment if any:   Dr. Gudena 09/29/2023 -Oncotype Dx score 16 Treatment plan: Adjuvant radiation therapy Adjuvant antiestrogen therapy   Lymphedema issues, if any: No     Pain issues, if any: She reports soreness to the breast since surgery.    SAFETY ISSUES: Prior radiation? No Pacemaker/ICD? No Possible current pregnancy? Hysterectomy Is the patient on methotrexate? No  Current Complaints / other details:

## 2023-10-02 NOTE — Progress Notes (Signed)
 Radiation Oncology         [phone] ________________________________  Name: Morgan Norman        MRN: [id number]  Date of Service: 10/02/2023 DOB: Sep 20, 1955  WU:[id number], [name], MD  Cameron Cea, MD     REFERRING PHYSICIAN: Cameron Cea, MD   DIAGNOSIS: The encounter diagnosis was Malignant neoplasm of upper-outer quadrant of right breast in female, estrogen receptor positive (HCC).   HISTORY OF PRESENT ILLNESS: Morgan Norman is a 68 y.o. female seen at the request of Dr. Lucienne Ryder for a new diagnosis of breast cancer.  The patient had a routine screening mammogram that identified a mass in the right breast.  She returned on 07/30/2023 for diagnostic imaging and spot compression views showed persistence in a mass in the outer right breast.  An adjacent symmetry seen on screening mammogram in the upper outer quadrant disperses with compression felt to be overlapping fibroglandular tissue.  Targeted ultrasound demonstrated the mass in the 9 o'clock position measuring 1.4 cm in greatest dimension, and normal fibroglandular tissue was noted throughout the upper outer quadrant from 9-12 o'clock.  No other suspicious findings were noted in the right axilla was negative for adenopathy.  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  Her cancer was ER positive PR negative HER2 negative by FISH with a Ki-67 of 3%.     Since  her last visit, she underwent lumpectomy on 09/01/2023.  Pathology showed a grade 2 invasive ductal carcinoma measuring 2.6 cm with associated DCIS.  Her margins for invasive disease were 1 mm to the posterior margin and considered clear and 0.1 mm from the superior margin for DCIS.  No lymph nodes were submitted.  Oncotype Dx was performed in her recurrence score result was 16.  This confirmed her tumor to be ER positive PR negative, HER2 negative.  Given this low risk score Dr. Lee Public does  not recommend chemotherapy but plans to offer antiestrogen therapy after completing radiation.  She is seen today to revisit recommendations of radiotherapy.   PREVIOUS RADIATION THERAPY: No   PAST MEDICAL HISTORY:  Past Medical History:  Diagnosis Date   Benign hypertensive renal disease    Breast cancer (HCC) 07/31/2023   Right Breast   CKD (chronic kidney disease) stage 2, GFR 60-89 ml/min    Hypertension    Migraines    Overweight    Pure hypercholesterolemia    Sleep apnea    Vitamin D deficiency        PAST SURGICAL HISTORY: Past Surgical History:  Procedure Laterality Date   bilateral foot surgery     BREAST BIOPSY Right 07/31/2023   US  RT BREAST BX W LOC DEV 1ST LESION IMG BX SPEC US  GUIDE 07/31/2023 [facility]   BREAST BIOPSY  08/29/2023   US  RT RADIOACTIVE SEED LOC 08/29/2023 [facility]   BREAST LUMPECTOMY WITH RADIOACTIVE SEED LOCALIZATION Right 09/01/2023   Procedure: BREAST LUMPECTOMY WITH RADIOACTIVE SEED LOCALIZATION;  Surgeon: [name], MD;  Location: [facility];  Service: General;  Laterality: Right;  LMA RADIOACTIVE SEED GUIDED RIGHT BREAST LUMPECTOMY   CESAREAN SECTION     TOTAL ABDOMINAL HYSTERECTOMY W/ BILATERAL SALPINGOOPHORECTOMY       FAMILY HISTORY:  Family History  Problem Relation Age of Onset   Diabetes Mellitus I Mother    Bladder Cancer Mother    Dementia Mother    Alcoholism Father  SOCIAL HISTORY:  reports that she has never smoked. She has never used smokeless tobacco. She reports that she does not currently use alcohol. She reports that she does not use drugs.  The patient is married and lives in [place].  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 She is originally from [facility] but her husband's job in [employer] moved their family frequently.  She has an adult son who teaches in [place].    ALLERGIES: Patient has no known allergies.   MEDICATIONS:  Current  Outpatient Medications  Medication Sig Dispense Refill   baclofen  (LIORESAL ) 10 MG tablet Take 1 tablet (10 mg total) by mouth at bedtime as needed for muscle spasms. 30 tablet 1   Cholecalciferol 5000 UNITS TABS Take 5,000 Units by mouth.     ELDERBERRY PO Take 1,000 mg by mouth.     loratadine (CLARITIN) 10 MG tablet Take 10 mg by mouth daily as needed for allergies.     meclizine  (ANTIVERT ) 25 MG tablet Take 1 tablet (25 mg total) by mouth 3 (three) times daily as needed for dizziness. 30 tablet 0   meloxicam (MOBIC) 15 MG tablet      Multiple Vitamins-Minerals (IMMUNE SUPPORT PO) Take 1,000 mg by mouth daily at 6 (six) AM.     nystatin  cream (MYCOSTATIN ) Apply topically 3 (three) times daily. 30 g 1   Omega-3 Fatty Acids (FISH OIL PO) Take 120 mg by mouth.     pantoprazole  (PROTONIX ) 20 MG tablet TAKE 1 TABLET(20 MG) BY MOUTH DAILY 90 tablet 2   Pramoxine-HC (HYDROCORTISONE  ACE-PRAMOXINE) 2.5-1 % CREA Apply 1 Application topically 2 (two) times daily as needed. 28.4 g 0   rosuvastatin  (CRESTOR ) 10 MG tablet TAKE 1 TABLET(10 MG) BY MOUTH DAILY 90 tablet 2   telmisartan  (MICARDIS ) 20 MG tablet TAKE 1 TABLET(20 MG) BY MOUTH DAILY 90 tablet 2   traMADol  (ULTRAM ) 50 MG tablet Take 1 tablet (50 mg total) by mouth every 6 (six) hours as needed for moderate pain (pain score 4-6) or severe pain (pain score 7-10). 25 tablet 0   triamcinolone  cream (KENALOG ) 0.1 % Apply 1 application. topically 2 (two) times daily. 30 g 1   valACYclovir  (VALTREX ) 500 MG tablet Take 1 tablet (500 mg total) by mouth daily. 90 tablet 1   No current facility-administered medications for this encounter.     REVIEW OF SYSTEMS: On review of systems, the patient reports that she is doing very well overall.  She has not experiencing any concerns with her healing however she is still having some discomfort in the surgical site.  She is not experiencing fevers or chills, drainage or separation from her incision.  No other  complaints are verbalized    PHYSICAL EXAM:  Wt Readings from Last 3 Encounters:  10/02/23 167 lb 6.4 oz (75.9 kg)  09/29/23 167 lb 1.6 oz (75.8 kg)  09/01/23 170 lb 3.1 oz (77.2 kg)   Temp Readings from Last 3 Encounters:  10/02/23 97.8 F (36.6 C)  09/29/23 97.6 F (36.4 C) (Temporal)  09/01/23 (!) 97 F (36.1 C) (Temporal)   BP Readings from Last 3 Encounters:  10/02/23 127/80  09/29/23 121/76  09/01/23 129/67   Pulse Readings from Last 3 Encounters:  09/29/23 72  09/01/23 95  08/26/23 65    In general this is a well appearing African-American  female in no acute distress. She's alert and oriented x4 and appropriate throughout the examination. Cardiopulmonary  assessment is negative for acute distress and she exhibits normal effort.  Her right breast incision site is well-healed without erythema, separation or drainage.  Mild induration is noted of the lumpectomy cavity deep to the skin but no fluctuance or fluid wave is appreciated.   ECOG = 1  0 - Asymptomatic (Fully active, able to carry on all predisease activities without restriction)  1 - Symptomatic but completely ambulatory (Restricted in physically strenuous activity but ambulatory and able to carry out work of a light or sedentary nature. For example, light housework, office work)  2 - Symptomatic, <50% in bed during the day (Ambulatory and capable of all self care but unable to carry out any work activities. Up and about more than 50% of waking hours)  3 - Symptomatic, >50% in bed, but not bedbound (Capable of only limited self-care, confined to bed or chair 50% or more of waking hours)  4 - Bedbound (Completely disabled. Cannot carry on any self-care. Totally confined to bed or chair)  5 - Death   Aurea Blossom MM, Creech RH, Tormey DC, et al. [phone]). Toxicity and response criteria of the [facility]. Am. Hillard Lowes. Oncol. 5 (6): 649-55    LABORATORY DATA:  Lab Results  Component Value  Date   WBC 3.9 12/05/2022   HGB 12.5 12/05/2022   HCT 38.7 12/05/2022   MCV 91 12/05/2022   PLT 109 (L) 12/05/2022   Lab Results  Component Value Date   NA 140 07/10/2023   K 4.9 07/10/2023   CL 101 07/10/2023   CO2 26 07/10/2023   Lab Results  Component Value Date   ALT 11 07/10/2023   AST 21 07/10/2023   ALKPHOS 73 07/10/2023   BILITOT 0.4 07/10/2023      RADIOGRAPHY: No results found.      IMPRESSION/PLAN: 1. 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 Dr. Jeryl Moris has reviewed her final pathology.  We reviewed the nature of early-stage disease of the breast.  Dr. Lee Public does not recommend chemotherapy based on her low risk Oncotype DX score but will plan to offer antiestrogen therapy.  Dr. Jeryl Moris recommends external radiotherapy to the breast  to reduce risks of local recurrence. We discussed the risks, benefits, short, and long term effects of radiotherapy, as well as the curative intent, and the patient is interested in proceeding.  I reviewed the delivery and logistics of radiotherapy and the Dr. Jeryl Moris recommends 4 weeks of radiotherapy to the right breast.  We discussed over-the-counter NSAIDs to try to improve her discomfort as well but reassured her of her surgical site appearing typical of others at this stage in their healing. Written consent is obtained and placed in the chart, a copy was provided to the patient.  She will simulate today.      In a visit lasting 45 minutes, greater than 50% of the time was spent face to face reviewing her case, as well as in preparation of, discussing, and coordinating the patient's care.      Shelvia Dick, [facility]    **Disclaimer: This note was dictated with voice recognition software. Similar sounding words can inadvertently be transcribed and this note may contain transcription errors which may not have been corrected upon publication of note.**

## 2023-10-08 DIAGNOSIS — Z1732 Human epidermal growth factor receptor 2 negative status: Secondary | ICD-10-CM | POA: Diagnosis not present

## 2023-10-08 DIAGNOSIS — C50411 Malignant neoplasm of upper-outer quadrant of right female breast: Secondary | ICD-10-CM | POA: Diagnosis not present

## 2023-10-08 DIAGNOSIS — Z170 Estrogen receptor positive status [ER+]: Secondary | ICD-10-CM | POA: Diagnosis not present

## 2023-10-08 DIAGNOSIS — Z1722 Progesterone receptor negative status: Secondary | ICD-10-CM | POA: Diagnosis not present

## 2023-10-08 DIAGNOSIS — Z510 Encounter for antineoplastic radiation therapy: Secondary | ICD-10-CM | POA: Diagnosis not present

## 2023-10-09 ENCOUNTER — Ambulatory Visit
Admission: RE | Admit: 2023-10-09 | Discharge: 2023-10-09 | Disposition: A | Discharge status: Home / Self Care | Source: Ambulatory Visit | Attending: Radiation Oncology | Admitting: Radiation Oncology

## 2023-10-09 ENCOUNTER — Other Ambulatory Visit: Payer: Self-pay

## 2023-10-09 DIAGNOSIS — C50411 Malignant neoplasm of upper-outer quadrant of right female breast: Secondary | ICD-10-CM | POA: Diagnosis not present

## 2023-10-09 DIAGNOSIS — Z1732 Human epidermal growth factor receptor 2 negative status: Secondary | ICD-10-CM | POA: Diagnosis not present

## 2023-10-09 DIAGNOSIS — Z170 Estrogen receptor positive status [ER+]: Secondary | ICD-10-CM | POA: Diagnosis not present

## 2023-10-09 DIAGNOSIS — Z510 Encounter for antineoplastic radiation therapy: Secondary | ICD-10-CM | POA: Diagnosis not present

## 2023-10-09 DIAGNOSIS — Z1722 Progesterone receptor negative status: Secondary | ICD-10-CM | POA: Diagnosis not present

## 2023-10-09 LAB — RAD ONC ARIA SESSION SUMMARY
Course Elapsed Days: 0
Plan Fractions Treated to Date: 1
Plan Prescribed Dose Per Fraction: 2.66 Gy
Plan Total Fractions Prescribed: 16
Plan Total Prescribed Dose: 42.56 Gy
Reference Point Dosage Given to Date: 2.66 Gy
Reference Point Session Dosage Given: 2.66 Gy
Session Number: 1

## 2023-10-10 ENCOUNTER — Other Ambulatory Visit: Payer: Self-pay

## 2023-10-10 ENCOUNTER — Ambulatory Visit
Admission: RE | Admit: 2023-10-10 | Discharge: 2023-10-10 | Disposition: A | Discharge status: Home / Self Care | Source: Ambulatory Visit | Attending: Radiation Oncology | Admitting: Radiation Oncology

## 2023-10-10 DIAGNOSIS — Z510 Encounter for antineoplastic radiation therapy: Secondary | ICD-10-CM | POA: Diagnosis not present

## 2023-10-10 DIAGNOSIS — Z170 Estrogen receptor positive status [ER+]: Secondary | ICD-10-CM

## 2023-10-10 DIAGNOSIS — Z1732 Human epidermal growth factor receptor 2 negative status: Secondary | ICD-10-CM | POA: Diagnosis not present

## 2023-10-10 DIAGNOSIS — Z1722 Progesterone receptor negative status: Secondary | ICD-10-CM | POA: Diagnosis not present

## 2023-10-10 DIAGNOSIS — C50411 Malignant neoplasm of upper-outer quadrant of right female breast: Secondary | ICD-10-CM | POA: Diagnosis not present

## 2023-10-10 LAB — RAD ONC ARIA SESSION SUMMARY
Course Elapsed Days: 1
Plan Fractions Treated to Date: 2
Plan Prescribed Dose Per Fraction: 2.66 Gy
Plan Total Fractions Prescribed: 16
Plan Total Prescribed Dose: 42.56 Gy
Reference Point Dosage Given to Date: 5.32 Gy
Reference Point Session Dosage Given: 2.66 Gy
Session Number: 2

## 2023-10-10 MED ORDER — RADIAPLEXRX EX GEL: Freq: Once | CUTANEOUS | Status: AC

## 2023-10-10 MED ORDER — ALRA NON-METALLIC DEODORANT (RAD-ONC)
1.0000 | Freq: Once | TOPICAL | Status: AC
  Administered 2023-10-10: 1 via TOPICAL

## 2023-10-13 ENCOUNTER — Other Ambulatory Visit: Payer: Self-pay

## 2023-10-13 ENCOUNTER — Ambulatory Visit
Admission: RE | Admit: 2023-10-13 | Discharge: 2023-10-13 | Disposition: A | Discharge status: Home / Self Care | Source: Ambulatory Visit | Attending: Radiation Oncology | Admitting: Radiation Oncology

## 2023-10-13 DIAGNOSIS — C50411 Malignant neoplasm of upper-outer quadrant of right female breast: Secondary | ICD-10-CM | POA: Diagnosis not present

## 2023-10-13 DIAGNOSIS — Z510 Encounter for antineoplastic radiation therapy: Secondary | ICD-10-CM | POA: Diagnosis not present

## 2023-10-13 DIAGNOSIS — Z1722 Progesterone receptor negative status: Secondary | ICD-10-CM | POA: Diagnosis not present

## 2023-10-13 DIAGNOSIS — Z1732 Human epidermal growth factor receptor 2 negative status: Secondary | ICD-10-CM | POA: Diagnosis not present

## 2023-10-13 DIAGNOSIS — Z170 Estrogen receptor positive status [ER+]: Secondary | ICD-10-CM | POA: Diagnosis not present

## 2023-10-13 LAB — RAD ONC ARIA SESSION SUMMARY
Course Elapsed Days: 4
Plan Fractions Treated to Date: 3
Plan Prescribed Dose Per Fraction: 2.66 Gy
Plan Total Fractions Prescribed: 16
Plan Total Prescribed Dose: 42.56 Gy
Reference Point Dosage Given to Date: 7.98 Gy
Reference Point Session Dosage Given: 2.66 Gy
Session Number: 3

## 2023-10-14 ENCOUNTER — Other Ambulatory Visit: Payer: Self-pay

## 2023-10-14 ENCOUNTER — Ambulatory Visit
Admission: RE | Admit: 2023-10-14 | Discharge: 2023-10-14 | Disposition: A | Discharge status: Home / Self Care | Source: Ambulatory Visit | Attending: Radiation Oncology

## 2023-10-14 DIAGNOSIS — Z1732 Human epidermal growth factor receptor 2 negative status: Secondary | ICD-10-CM | POA: Diagnosis not present

## 2023-10-14 DIAGNOSIS — Z510 Encounter for antineoplastic radiation therapy: Secondary | ICD-10-CM | POA: Diagnosis not present

## 2023-10-14 DIAGNOSIS — M461 Sacroiliitis, not elsewhere classified: Secondary | ICD-10-CM | POA: Diagnosis not present

## 2023-10-14 DIAGNOSIS — Z1722 Progesterone receptor negative status: Secondary | ICD-10-CM | POA: Diagnosis not present

## 2023-10-14 DIAGNOSIS — C50411 Malignant neoplasm of upper-outer quadrant of right female breast: Secondary | ICD-10-CM | POA: Diagnosis not present

## 2023-10-14 DIAGNOSIS — Z170 Estrogen receptor positive status [ER+]: Secondary | ICD-10-CM | POA: Diagnosis not present

## 2023-10-14 LAB — RAD ONC ARIA SESSION SUMMARY
Course Elapsed Days: 5
Plan Fractions Treated to Date: 4
Plan Prescribed Dose Per Fraction: 2.66 Gy
Plan Total Fractions Prescribed: 16
Plan Total Prescribed Dose: 42.56 Gy
Reference Point Dosage Given to Date: 10.64 Gy
Reference Point Session Dosage Given: 2.66 Gy
Session Number: 4

## 2023-10-15 ENCOUNTER — Ambulatory Visit
Admission: RE | Admit: 2023-10-15 | Discharge: 2023-10-15 | Disposition: A | Discharge status: Home / Self Care | Source: Ambulatory Visit | Attending: Radiation Oncology | Admitting: Radiation Oncology

## 2023-10-15 ENCOUNTER — Other Ambulatory Visit: Payer: Self-pay

## 2023-10-15 DIAGNOSIS — M545 Low back pain, unspecified: Secondary | ICD-10-CM | POA: Diagnosis not present

## 2023-10-15 DIAGNOSIS — Z1722 Progesterone receptor negative status: Secondary | ICD-10-CM | POA: Diagnosis not present

## 2023-10-15 DIAGNOSIS — M461 Sacroiliitis, not elsewhere classified: Secondary | ICD-10-CM | POA: Diagnosis not present

## 2023-10-15 DIAGNOSIS — C50411 Malignant neoplasm of upper-outer quadrant of right female breast: Secondary | ICD-10-CM | POA: Diagnosis not present

## 2023-10-15 DIAGNOSIS — Z170 Estrogen receptor positive status [ER+]: Secondary | ICD-10-CM | POA: Diagnosis not present

## 2023-10-15 DIAGNOSIS — Z510 Encounter for antineoplastic radiation therapy: Secondary | ICD-10-CM | POA: Diagnosis not present

## 2023-10-15 DIAGNOSIS — M7918 Myalgia, other site: Secondary | ICD-10-CM | POA: Diagnosis not present

## 2023-10-15 DIAGNOSIS — Z1732 Human epidermal growth factor receptor 2 negative status: Secondary | ICD-10-CM | POA: Diagnosis not present

## 2023-10-15 LAB — RAD ONC ARIA SESSION SUMMARY
Course Elapsed Days: 6
Plan Fractions Treated to Date: 5
Plan Prescribed Dose Per Fraction: 2.66 Gy
Plan Total Fractions Prescribed: 16
Plan Total Prescribed Dose: 42.56 Gy
Reference Point Dosage Given to Date: 13.3 Gy
Reference Point Session Dosage Given: 2.66 Gy
Session Number: 5

## 2023-10-16 ENCOUNTER — Other Ambulatory Visit: Payer: Self-pay

## 2023-10-16 ENCOUNTER — Ambulatory Visit
Admission: RE | Admit: 2023-10-16 | Discharge: 2023-10-16 | Disposition: A | Discharge status: Home / Self Care | Source: Ambulatory Visit | Attending: Radiation Oncology | Admitting: Radiation Oncology

## 2023-10-16 DIAGNOSIS — Z1732 Human epidermal growth factor receptor 2 negative status: Secondary | ICD-10-CM | POA: Diagnosis not present

## 2023-10-16 DIAGNOSIS — C50411 Malignant neoplasm of upper-outer quadrant of right female breast: Secondary | ICD-10-CM | POA: Diagnosis not present

## 2023-10-16 DIAGNOSIS — Z1722 Progesterone receptor negative status: Secondary | ICD-10-CM | POA: Diagnosis not present

## 2023-10-16 DIAGNOSIS — Z510 Encounter for antineoplastic radiation therapy: Secondary | ICD-10-CM | POA: Diagnosis not present

## 2023-10-16 DIAGNOSIS — Z170 Estrogen receptor positive status [ER+]: Secondary | ICD-10-CM | POA: Diagnosis not present

## 2023-10-16 LAB — RAD ONC ARIA SESSION SUMMARY
Course Elapsed Days: 7
Plan Fractions Treated to Date: 6
Plan Prescribed Dose Per Fraction: 2.66 Gy
Plan Total Fractions Prescribed: 16
Plan Total Prescribed Dose: 42.56 Gy
Reference Point Dosage Given to Date: 15.96 Gy
Reference Point Session Dosage Given: 2.66 Gy
Session Number: 6

## 2023-10-17 ENCOUNTER — Ambulatory Visit

## 2023-10-20 ENCOUNTER — Other Ambulatory Visit: Payer: Self-pay

## 2023-10-20 ENCOUNTER — Ambulatory Visit
Admission: RE | Admit: 2023-10-20 | Discharge: 2023-10-20 | Disposition: A | Discharge status: Home / Self Care | Source: Ambulatory Visit | Attending: Radiation Oncology | Admitting: Radiation Oncology

## 2023-10-20 DIAGNOSIS — Z510 Encounter for antineoplastic radiation therapy: Secondary | ICD-10-CM | POA: Diagnosis not present

## 2023-10-20 DIAGNOSIS — C50411 Malignant neoplasm of upper-outer quadrant of right female breast: Secondary | ICD-10-CM | POA: Diagnosis not present

## 2023-10-20 DIAGNOSIS — Z1732 Human epidermal growth factor receptor 2 negative status: Secondary | ICD-10-CM | POA: Diagnosis not present

## 2023-10-20 DIAGNOSIS — Z170 Estrogen receptor positive status [ER+]: Secondary | ICD-10-CM | POA: Diagnosis not present

## 2023-10-20 DIAGNOSIS — Z1722 Progesterone receptor negative status: Secondary | ICD-10-CM | POA: Diagnosis not present

## 2023-10-20 LAB — RAD ONC ARIA SESSION SUMMARY
Course Elapsed Days: 11
Plan Fractions Treated to Date: 7
Plan Prescribed Dose Per Fraction: 2.66 Gy
Plan Total Fractions Prescribed: 16
Plan Total Prescribed Dose: 42.56 Gy
Reference Point Dosage Given to Date: 18.62 Gy
Reference Point Session Dosage Given: 2.66 Gy
Session Number: 7

## 2023-10-21 ENCOUNTER — Ambulatory Visit
Admission: RE | Admit: 2023-10-21 | Discharge: 2023-10-21 | Disposition: A | Discharge status: Home / Self Care | Source: Ambulatory Visit | Attending: Radiation Oncology | Admitting: Radiation Oncology

## 2023-10-21 ENCOUNTER — Other Ambulatory Visit: Payer: Self-pay

## 2023-10-21 DIAGNOSIS — C50411 Malignant neoplasm of upper-outer quadrant of right female breast: Secondary | ICD-10-CM | POA: Diagnosis not present

## 2023-10-21 DIAGNOSIS — Z170 Estrogen receptor positive status [ER+]: Secondary | ICD-10-CM | POA: Diagnosis not present

## 2023-10-21 DIAGNOSIS — Z1722 Progesterone receptor negative status: Secondary | ICD-10-CM | POA: Diagnosis not present

## 2023-10-21 DIAGNOSIS — Z510 Encounter for antineoplastic radiation therapy: Secondary | ICD-10-CM | POA: Diagnosis not present

## 2023-10-21 DIAGNOSIS — Z1732 Human epidermal growth factor receptor 2 negative status: Secondary | ICD-10-CM | POA: Diagnosis not present

## 2023-10-21 LAB — RAD ONC ARIA SESSION SUMMARY
Course Elapsed Days: 12
Plan Fractions Treated to Date: 8
Plan Prescribed Dose Per Fraction: 2.66 Gy
Plan Total Fractions Prescribed: 16
Plan Total Prescribed Dose: 42.56 Gy
Reference Point Dosage Given to Date: 21.28 Gy
Reference Point Session Dosage Given: 2.66 Gy
Session Number: 8

## 2023-10-22 ENCOUNTER — Ambulatory Visit
Admission: RE | Admit: 2023-10-22 | Discharge: 2023-10-22 | Disposition: A | Discharge status: Home / Self Care | Source: Ambulatory Visit | Attending: Radiation Oncology | Admitting: Radiation Oncology

## 2023-10-22 ENCOUNTER — Other Ambulatory Visit: Payer: Self-pay

## 2023-10-22 DIAGNOSIS — Z1732 Human epidermal growth factor receptor 2 negative status: Secondary | ICD-10-CM | POA: Diagnosis not present

## 2023-10-22 DIAGNOSIS — C50411 Malignant neoplasm of upper-outer quadrant of right female breast: Secondary | ICD-10-CM | POA: Diagnosis not present

## 2023-10-22 DIAGNOSIS — Z1722 Progesterone receptor negative status: Secondary | ICD-10-CM | POA: Diagnosis not present

## 2023-10-22 DIAGNOSIS — Z510 Encounter for antineoplastic radiation therapy: Secondary | ICD-10-CM | POA: Diagnosis not present

## 2023-10-22 DIAGNOSIS — Z170 Estrogen receptor positive status [ER+]: Secondary | ICD-10-CM | POA: Diagnosis not present

## 2023-10-22 LAB — RAD ONC ARIA SESSION SUMMARY
Course Elapsed Days: 13
Plan Fractions Treated to Date: 9
Plan Prescribed Dose Per Fraction: 2.66 Gy
Plan Total Fractions Prescribed: 16
Plan Total Prescribed Dose: 42.56 Gy
Reference Point Dosage Given to Date: 23.94 Gy
Reference Point Session Dosage Given: 2.66 Gy
Session Number: 9

## 2023-10-23 ENCOUNTER — Ambulatory Visit
Admission: RE | Admit: 2023-10-23 | Discharge: 2023-10-23 | Disposition: A | Discharge status: Home / Self Care | Source: Ambulatory Visit | Attending: Radiation Oncology | Admitting: Radiation Oncology

## 2023-10-23 ENCOUNTER — Other Ambulatory Visit: Payer: Self-pay

## 2023-10-23 DIAGNOSIS — Z1732 Human epidermal growth factor receptor 2 negative status: Secondary | ICD-10-CM | POA: Diagnosis not present

## 2023-10-23 DIAGNOSIS — C50411 Malignant neoplasm of upper-outer quadrant of right female breast: Secondary | ICD-10-CM | POA: Diagnosis not present

## 2023-10-23 DIAGNOSIS — Z510 Encounter for antineoplastic radiation therapy: Secondary | ICD-10-CM | POA: Diagnosis not present

## 2023-10-23 DIAGNOSIS — Z1722 Progesterone receptor negative status: Secondary | ICD-10-CM | POA: Diagnosis not present

## 2023-10-23 DIAGNOSIS — Z170 Estrogen receptor positive status [ER+]: Secondary | ICD-10-CM | POA: Diagnosis not present

## 2023-10-23 LAB — RAD ONC ARIA SESSION SUMMARY
Course Elapsed Days: 14
Plan Fractions Treated to Date: 10
Plan Prescribed Dose Per Fraction: 2.66 Gy
Plan Total Fractions Prescribed: 16
Plan Total Prescribed Dose: 42.56 Gy
Reference Point Dosage Given to Date: 26.6 Gy
Reference Point Session Dosage Given: 2.66 Gy
Session Number: 10

## 2023-10-26 NOTE — Assessment & Plan Note (Signed)
 09/01/2023: Right lumpectomy: Grade 2 IDC with mucinous features 2.6 cm with DCIS, margins negative, lymphovascular invasion not identified, intraductal papillomas and UDH, ER 90%, PR 0%, HER2 5%, Ki67 2+ by IHC, FISH negative ratio 1.03 Oncotype DX score 16 (distant recurrence at 9 years: 4%)   Treatment plan: Adjuvant radiation therapy 10/10/23- 11/07/23 Adjuvant antiestrogen therapy -------------------------------------------------------------------------------------------------------------------

## 2023-10-27 ENCOUNTER — Ambulatory Visit
Admission: RE | Admit: 2023-10-27 | Discharge: 2023-10-27 | Disposition: A | Discharge status: Home / Self Care | Source: Ambulatory Visit | Attending: Radiation Oncology

## 2023-10-27 ENCOUNTER — Other Ambulatory Visit: Payer: Self-pay

## 2023-10-27 ENCOUNTER — Inpatient Hospital Stay (HOSPITAL_BASED_OUTPATIENT_CLINIC_OR_DEPARTMENT_OTHER): Admitting: Hematology and Oncology

## 2023-10-27 VITALS — BP 121/81 | HR 72 | Temp 97.7°F | Resp 16 | Ht 65.0 in | Wt 164.7 lb

## 2023-10-27 DIAGNOSIS — Z1721 Progesterone receptor positive status: Secondary | ICD-10-CM | POA: Insufficient documentation

## 2023-10-27 DIAGNOSIS — C50411 Malignant neoplasm of upper-outer quadrant of right female breast: Secondary | ICD-10-CM | POA: Diagnosis not present

## 2023-10-27 DIAGNOSIS — Z1731 Human epidermal growth factor receptor 2 positive status: Secondary | ICD-10-CM | POA: Insufficient documentation

## 2023-10-27 DIAGNOSIS — Z170 Estrogen receptor positive status [ER+]: Secondary | ICD-10-CM | POA: Diagnosis not present

## 2023-10-27 DIAGNOSIS — Z1722 Progesterone receptor negative status: Secondary | ICD-10-CM | POA: Diagnosis not present

## 2023-10-27 DIAGNOSIS — Z79811 Long term (current) use of aromatase inhibitors: Secondary | ICD-10-CM | POA: Insufficient documentation

## 2023-10-27 DIAGNOSIS — M810 Age-related osteoporosis without current pathological fracture: Secondary | ICD-10-CM | POA: Insufficient documentation

## 2023-10-27 DIAGNOSIS — N951 Menopausal and female climacteric states: Secondary | ICD-10-CM | POA: Insufficient documentation

## 2023-10-27 DIAGNOSIS — M545 Low back pain, unspecified: Secondary | ICD-10-CM | POA: Diagnosis not present

## 2023-10-27 DIAGNOSIS — Z79899 Other long term (current) drug therapy: Secondary | ICD-10-CM | POA: Insufficient documentation

## 2023-10-27 DIAGNOSIS — Z1732 Human epidermal growth factor receptor 2 negative status: Secondary | ICD-10-CM | POA: Diagnosis not present

## 2023-10-27 DIAGNOSIS — M2560 Stiffness of unspecified joint, not elsewhere classified: Secondary | ICD-10-CM | POA: Insufficient documentation

## 2023-10-27 DIAGNOSIS — Z510 Encounter for antineoplastic radiation therapy: Secondary | ICD-10-CM | POA: Diagnosis not present

## 2023-10-27 LAB — RAD ONC ARIA SESSION SUMMARY
Course Elapsed Days: 18
Plan Fractions Treated to Date: 11
Plan Prescribed Dose Per Fraction: 2.66 Gy
Plan Total Fractions Prescribed: 16
Plan Total Prescribed Dose: 42.56 Gy
Reference Point Dosage Given to Date: 29.26 Gy
Reference Point Session Dosage Given: 2.66 Gy
Session Number: 11

## 2023-10-27 MED ORDER — TAMOXIFEN CITRATE 20 MG PO TABS: 20.0000 mg | ORAL_TABLET | Freq: Every day | ORAL | 3 refills | Status: AC

## 2023-10-27 NOTE — Progress Notes (Signed)
 Patient Care Team: Jarold Medici, MD as PCP - General (Internal Medicine) Tyree Nanetta SAILOR, RN as Oncology Nurse Navigator Glean, Stephane BROCKS, RN (Inactive) as Registered Nurse Odean Potts, MD as Consulting Physician (Hematology and Oncology) Vernetta Berg, MD as Consulting Physician (General Surgery)  DIAGNOSIS:  Encounter Diagnosis  Name Primary?   Malignant neoplasm of upper-outer quadrant of right breast in female, estrogen receptor positive (HCC) Yes    SUMMARY OF ONCOLOGIC HISTORY: Oncology History  Malignant neoplasm of upper-outer quadrant of right breast in female, estrogen receptor positive (HCC)  07/31/2023 Initial Diagnosis   Screening mammogram detected right breast mass 1.4 cm by ultrasound at 9 o'clock position 4 cm from the nipple, no axillary lymph nodes, biopsy: Grade 2 IDC with intermediate grade DCIS, ER 95%, PR 0%, Ki-67 3%, HER2 equivocal 2+ by IHC, FISH negative ratio 0.95   08/20/2023 Cancer Staging   Staging form: Breast, AJCC 8th Edition - Clinical: Stage IA (cT1c, cN0, cM0, G2, ER+, PR-, HER2-) - Signed by Odean Potts, MD on 08/20/2023 Stage prefix: Initial diagnosis Histologic grading system: 3 grade system     CHIEF COMPLIANT: Follow-up from radiation  HISTORY OF PRESENT ILLNESS:  History of Present Illness Morgan Norman is a 68 year old female undergoing radiation therapy for cancer who presents for a follow-up visit.  She is currently in her tenth session of radiation therapy, with eight more sessions remaining. One session was rescheduled due to machine issues. She experiences hot flashes and joint stiffness, which may be side effects of anastrozole, taken at 1 mg daily for five to seven years.  She has osteoporosis, previously treated with Fosamax  and an infusion on November 12th, 2023. She had a bone density scan in 2023, but results are unavailable. She recalls past compression fractures from a fall.  She is considering  switching to tamoxifen  and is aware of its side effects, including a small risk of blood clots and mood swings. She does not have a uterus, so some side effects are not applicable.     ALLERGIES:  has no known allergies.  MEDICATIONS:  Current Outpatient Medications  Medication Sig Dispense Refill   baclofen  (LIORESAL ) 10 MG tablet Take 1 tablet (10 mg total) by mouth at bedtime as needed for muscle spasms. 30 tablet 1   Cholecalciferol 5000 UNITS TABS Take 5,000 Units by mouth.     ELDERBERRY PO Take 1,000 mg by mouth.     loratadine (CLARITIN) 10 MG tablet Take 10 mg by mouth daily as needed for allergies.     meclizine  (ANTIVERT ) 25 MG tablet Take 1 tablet (25 mg total) by mouth 3 (three) times daily as needed for dizziness. 30 tablet 0   meloxicam (MOBIC) 15 MG tablet      Multiple Vitamins-Minerals (IMMUNE SUPPORT PO) Take 1,000 mg by mouth daily at 6 (six) AM.     nystatin  cream (MYCOSTATIN ) Apply topically 3 (three) times daily. 30 g 1   Omega-3 Fatty Acids (FISH OIL PO) Take 120 mg by mouth.     pantoprazole  (PROTONIX ) 20 MG tablet TAKE 1 TABLET(20 MG) BY MOUTH DAILY 90 tablet 2   Pramoxine-HC (HYDROCORTISONE  ACE-PRAMOXINE) 2.5-1 % CREA Apply 1 Application topically 2 (two) times daily as needed. 28.4 g 0   rosuvastatin  (CRESTOR ) 10 MG tablet TAKE 1 TABLET(10 MG) BY MOUTH DAILY 90 tablet 2   telmisartan  (MICARDIS ) 20 MG tablet TAKE 1 TABLET(20 MG) BY MOUTH DAILY 90 tablet 2   traMADol  (ULTRAM ) 50 MG  tablet Take 1 tablet (50 mg total) by mouth every 6 (six) hours as needed for moderate pain (pain score 4-6) or severe pain (pain score 7-10). 25 tablet 0   triamcinolone  cream (KENALOG ) 0.1 % Apply 1 application. topically 2 (two) times daily. 30 g 1   valACYclovir  (VALTREX ) 500 MG tablet Take 1 tablet (500 mg total) by mouth daily. 90 tablet 1   No current facility-administered medications for this visit.    PHYSICAL EXAMINATION: ECOG PERFORMANCE STATUS: 1 - Symptomatic but  completely ambulatory  Vitals:   10/27/23 1054  BP: 121/81  Pulse: 72  Resp: 16  Temp: 97.7 F (36.5 C)  SpO2: 99%   Filed Weights   10/27/23 1054  Weight: 164 lb 11.2 oz (74.7 kg)      LABORATORY DATA:  I have reviewed the data as listed    Latest Ref Rng & Units 07/10/2023    3:26 PM 03/11/2023   10:56 AM 12/05/2022   10:02 AM  CMP  Glucose 70 - 99 mg/dL 83  75  84   BUN 8 - 27 mg/dL 11  9  9    Creatinine 0.57 - 1.00 mg/dL 9.12  9.23  9.22   Sodium 134 - 144 mmol/L 140  140  141   Potassium 3.5 - 5.2 mmol/L 4.9  4.0  4.5   Chloride 96 - 106 mmol/L 101  101  103   CO2 20 - 29 mmol/L 26  26  24    Calcium  8.7 - 10.3 mg/dL 9.2  8.8  8.7   Total Protein 6.0 - 8.5 g/dL 7.3   6.7   Total Bilirubin 0.0 - 1.2 mg/dL 0.4   0.4   Alkaline Phos 44 - 121 IU/L 73   70   AST 0 - 40 IU/L 21   21   ALT 0 - 32 IU/L 11   9     Lab Results  Component Value Date   WBC 3.9 12/05/2022   HGB 12.5 12/05/2022   HCT 38.7 12/05/2022   MCV 91 12/05/2022   PLT 109 (L) 12/05/2022   NEUTROABS 2.0 10/07/2019    ASSESSMENT & PLAN:  Malignant neoplasm of upper-outer quadrant of right breast in female, estrogen receptor positive (HCC) 09/01/2023: Right lumpectomy: Grade 2 IDC with mucinous features 2.6 cm with DCIS, margins negative, lymphovascular invasion not identified, intraductal papillomas and UDH, ER 90%, PR 0%, HER2 5%, Ki67 2+ by IHC, FISH negative ratio 1.03 Oncotype DX score 16 (distant recurrence at 9 years: 4%)   Treatment plan: Adjuvant radiation therapy 10/10/23- 11/07/23 Adjuvant antiestrogen therapy to start 11/21/2023 (we discussed the role of anastrozole versus tamoxifen .  She has severe osteoporosis apparently I do not have a copy for bone density.  She was being seen by osteoporosis clinic at [facility].  She could not tolerate Zometa .  Therefore we decided to use tamoxifen  instead)  Tamoxifen  counseling: We discussed the risks and benefits of tamoxifen . These include but not  limited to insomnia, hot flashes, mood changes, vaginal dryness, and weight gain. Although rare, serious side effects including  risk of blood clots were also discussed. We strongly believe that the benefits far outweigh the risks. Patient understands these risks and consented to starting treatment. Planned treatment duration is 5-7 years. Patient had prior hysterectomy.  Return clinic in 3 months for survivorship care plan visit ------------------------------------------------------------------------------------------------------------------- ------------------------------------- Assessment and Plan Assessment & Plan Breast cancer undergoing radiation therapy Undergoing radiation therapy with 10 sessions completed. Discussed post-radiation  therapy options: anastrozole and tamoxifen , considering bone health and side effects. - Continue radiation therapy, completion by November 07, 2023. - Prescribe anastrozole 1 mg daily post-radiation, starting November 21, 2023, if bone density is adequate. - Consider tamoxifen  20 mg daily if bone density is a concern, starting November 21, 2023. - Send prescription to [facility] with a 90-day supply and three refills.  Osteoporosis Osteoporosis with previous treatment. Discussed breast cancer treatment impact on bone health and need for alternative medications if bone density is compromised. - Ensure bone density monitoring every two years. - Review previous bone density results and infusion records. - Consider alternative breast cancer treatment if bone density is inadequate.      No orders of the defined types were placed in this encounter.  The patient has a good understanding of the overall plan. she agrees with it. she will call with any problems that may develop before the next visit here. Total time spent: 30 mins including face to face time and time spent for planning, charting and co-ordination of care   Viinay K Tia Hieronymus, MD 10/27/23

## 2023-10-28 ENCOUNTER — Ambulatory Visit
Admission: RE | Admit: 2023-10-28 | Discharge: 2023-10-28 | Disposition: A | Discharge status: Home / Self Care | Source: Ambulatory Visit | Attending: Radiation Oncology | Admitting: Radiation Oncology

## 2023-10-28 ENCOUNTER — Other Ambulatory Visit: Payer: Self-pay

## 2023-10-28 DIAGNOSIS — Z510 Encounter for antineoplastic radiation therapy: Secondary | ICD-10-CM | POA: Diagnosis not present

## 2023-10-28 DIAGNOSIS — Z1722 Progesterone receptor negative status: Secondary | ICD-10-CM | POA: Diagnosis not present

## 2023-10-28 DIAGNOSIS — Z1732 Human epidermal growth factor receptor 2 negative status: Secondary | ICD-10-CM | POA: Diagnosis not present

## 2023-10-28 DIAGNOSIS — Z170 Estrogen receptor positive status [ER+]: Secondary | ICD-10-CM | POA: Diagnosis not present

## 2023-10-28 DIAGNOSIS — C50411 Malignant neoplasm of upper-outer quadrant of right female breast: Secondary | ICD-10-CM | POA: Diagnosis not present

## 2023-10-28 LAB — RAD ONC ARIA SESSION SUMMARY
Course Elapsed Days: 19
Plan Fractions Treated to Date: 12
Plan Prescribed Dose Per Fraction: 2.66 Gy
Plan Total Fractions Prescribed: 16
Plan Total Prescribed Dose: 42.56 Gy
Reference Point Dosage Given to Date: 31.92 Gy
Reference Point Session Dosage Given: 2.66 Gy
Session Number: 12

## 2023-10-29 ENCOUNTER — Other Ambulatory Visit: Payer: Self-pay

## 2023-10-29 ENCOUNTER — Ambulatory Visit
Admission: RE | Admit: 2023-10-29 | Discharge: 2023-10-29 | Disposition: A | Discharge status: Home / Self Care | Source: Ambulatory Visit | Attending: Radiation Oncology | Admitting: Radiation Oncology

## 2023-10-29 DIAGNOSIS — Z1732 Human epidermal growth factor receptor 2 negative status: Secondary | ICD-10-CM | POA: Diagnosis not present

## 2023-10-29 DIAGNOSIS — Z510 Encounter for antineoplastic radiation therapy: Secondary | ICD-10-CM | POA: Diagnosis not present

## 2023-10-29 DIAGNOSIS — Z170 Estrogen receptor positive status [ER+]: Secondary | ICD-10-CM | POA: Diagnosis not present

## 2023-10-29 DIAGNOSIS — Z1722 Progesterone receptor negative status: Secondary | ICD-10-CM | POA: Diagnosis not present

## 2023-10-29 DIAGNOSIS — C50411 Malignant neoplasm of upper-outer quadrant of right female breast: Secondary | ICD-10-CM | POA: Diagnosis not present

## 2023-10-29 LAB — RAD ONC ARIA SESSION SUMMARY
Course Elapsed Days: 20
Plan Fractions Treated to Date: 13
Plan Prescribed Dose Per Fraction: 2.66 Gy
Plan Total Fractions Prescribed: 16
Plan Total Prescribed Dose: 42.56 Gy
Reference Point Dosage Given to Date: 34.58 Gy
Reference Point Session Dosage Given: 2.66 Gy
Session Number: 13

## 2023-10-30 ENCOUNTER — Other Ambulatory Visit: Payer: Self-pay

## 2023-10-30 ENCOUNTER — Ambulatory Visit
Admission: RE | Admit: 2023-10-30 | Discharge: 2023-10-30 | Disposition: A | Discharge status: Home / Self Care | Source: Ambulatory Visit | Attending: Radiation Oncology | Admitting: Radiation Oncology

## 2023-10-30 DIAGNOSIS — Z1722 Progesterone receptor negative status: Secondary | ICD-10-CM | POA: Diagnosis not present

## 2023-10-30 DIAGNOSIS — C50411 Malignant neoplasm of upper-outer quadrant of right female breast: Secondary | ICD-10-CM | POA: Diagnosis not present

## 2023-10-30 DIAGNOSIS — Z1732 Human epidermal growth factor receptor 2 negative status: Secondary | ICD-10-CM | POA: Diagnosis not present

## 2023-10-30 DIAGNOSIS — Z510 Encounter for antineoplastic radiation therapy: Secondary | ICD-10-CM | POA: Diagnosis not present

## 2023-10-30 DIAGNOSIS — Z170 Estrogen receptor positive status [ER+]: Secondary | ICD-10-CM | POA: Diagnosis not present

## 2023-10-30 LAB — RAD ONC ARIA SESSION SUMMARY
Course Elapsed Days: 21
Plan Fractions Treated to Date: 14
Plan Prescribed Dose Per Fraction: 2.66 Gy
Plan Total Fractions Prescribed: 16
Plan Total Prescribed Dose: 42.56 Gy
Reference Point Dosage Given to Date: 37.24 Gy
Reference Point Session Dosage Given: 2.66 Gy
Session Number: 14

## 2023-10-31 ENCOUNTER — Ambulatory Visit: Admitting: Radiation Oncology

## 2023-10-31 ENCOUNTER — Ambulatory Visit
Admission: RE | Admit: 2023-10-31 | Discharge: 2023-10-31 | Disposition: A | Discharge status: Home / Self Care | Source: Ambulatory Visit | Attending: Radiation Oncology | Admitting: Radiation Oncology

## 2023-10-31 ENCOUNTER — Ambulatory Visit

## 2023-10-31 ENCOUNTER — Other Ambulatory Visit: Payer: Self-pay

## 2023-10-31 DIAGNOSIS — Z1722 Progesterone receptor negative status: Secondary | ICD-10-CM | POA: Diagnosis not present

## 2023-10-31 DIAGNOSIS — Z1732 Human epidermal growth factor receptor 2 negative status: Secondary | ICD-10-CM | POA: Diagnosis not present

## 2023-10-31 DIAGNOSIS — C50411 Malignant neoplasm of upper-outer quadrant of right female breast: Secondary | ICD-10-CM | POA: Diagnosis not present

## 2023-10-31 DIAGNOSIS — Z510 Encounter for antineoplastic radiation therapy: Secondary | ICD-10-CM | POA: Diagnosis not present

## 2023-10-31 DIAGNOSIS — Z170 Estrogen receptor positive status [ER+]: Secondary | ICD-10-CM | POA: Diagnosis not present

## 2023-10-31 LAB — RAD ONC ARIA SESSION SUMMARY
Course Elapsed Days: 22
Plan Fractions Treated to Date: 15
Plan Prescribed Dose Per Fraction: 2.66 Gy
Plan Total Fractions Prescribed: 16
Plan Total Prescribed Dose: 42.56 Gy
Reference Point Dosage Given to Date: 39.9 Gy
Reference Point Session Dosage Given: 2.66 Gy
Session Number: 15

## 2023-11-03 ENCOUNTER — Other Ambulatory Visit: Payer: Self-pay

## 2023-11-03 ENCOUNTER — Ambulatory Visit

## 2023-11-03 ENCOUNTER — Ambulatory Visit
Admission: RE | Admit: 2023-11-03 | Discharge: 2023-11-03 | Disposition: A | Discharge status: Home / Self Care | Source: Ambulatory Visit | Attending: Radiation Oncology | Admitting: Radiation Oncology

## 2023-11-03 DIAGNOSIS — Z1722 Progesterone receptor negative status: Secondary | ICD-10-CM | POA: Diagnosis not present

## 2023-11-03 DIAGNOSIS — Z510 Encounter for antineoplastic radiation therapy: Secondary | ICD-10-CM | POA: Diagnosis not present

## 2023-11-03 DIAGNOSIS — Z170 Estrogen receptor positive status [ER+]: Secondary | ICD-10-CM | POA: Diagnosis not present

## 2023-11-03 DIAGNOSIS — C50411 Malignant neoplasm of upper-outer quadrant of right female breast: Secondary | ICD-10-CM | POA: Diagnosis not present

## 2023-11-03 DIAGNOSIS — Z1732 Human epidermal growth factor receptor 2 negative status: Secondary | ICD-10-CM | POA: Diagnosis not present

## 2023-11-03 LAB — RAD ONC ARIA SESSION SUMMARY
Course Elapsed Days: 25
Plan Fractions Treated to Date: 16
Plan Prescribed Dose Per Fraction: 2.66 Gy
Plan Total Fractions Prescribed: 16
Plan Total Prescribed Dose: 42.56 Gy
Reference Point Dosage Given to Date: 42.56 Gy
Reference Point Session Dosage Given: 2.66 Gy
Session Number: 16

## 2023-11-04 ENCOUNTER — Ambulatory Visit

## 2023-11-04 ENCOUNTER — Ambulatory Visit
Admission: RE | Admit: 2023-11-04 | Discharge: 2023-11-04 | Disposition: A | Discharge status: Home / Self Care | Source: Ambulatory Visit | Attending: Radiation Oncology

## 2023-11-04 ENCOUNTER — Other Ambulatory Visit: Payer: Self-pay

## 2023-11-04 DIAGNOSIS — C50411 Malignant neoplasm of upper-outer quadrant of right female breast: Secondary | ICD-10-CM | POA: Diagnosis not present

## 2023-11-04 DIAGNOSIS — Z510 Encounter for antineoplastic radiation therapy: Secondary | ICD-10-CM | POA: Diagnosis not present

## 2023-11-04 DIAGNOSIS — Z1732 Human epidermal growth factor receptor 2 negative status: Secondary | ICD-10-CM | POA: Diagnosis not present

## 2023-11-04 DIAGNOSIS — Z1722 Progesterone receptor negative status: Secondary | ICD-10-CM | POA: Diagnosis not present

## 2023-11-04 DIAGNOSIS — Z170 Estrogen receptor positive status [ER+]: Secondary | ICD-10-CM | POA: Diagnosis not present

## 2023-11-04 LAB — RAD ONC ARIA SESSION SUMMARY
Course Elapsed Days: 26
Plan Fractions Treated to Date: 1
Plan Prescribed Dose Per Fraction: 2 Gy
Plan Total Fractions Prescribed: 4
Plan Total Prescribed Dose: 8 Gy
Reference Point Dosage Given to Date: 2 Gy
Reference Point Session Dosage Given: 2 Gy
Session Number: 17

## 2023-11-05 ENCOUNTER — Ambulatory Visit
Admission: RE | Admit: 2023-11-05 | Discharge: 2023-11-05 | Disposition: A | Discharge status: Home / Self Care | Source: Ambulatory Visit | Attending: Radiation Oncology

## 2023-11-05 ENCOUNTER — Ambulatory Visit

## 2023-11-05 ENCOUNTER — Other Ambulatory Visit: Payer: Self-pay

## 2023-11-05 DIAGNOSIS — Z1732 Human epidermal growth factor receptor 2 negative status: Secondary | ICD-10-CM | POA: Diagnosis not present

## 2023-11-05 DIAGNOSIS — C50411 Malignant neoplasm of upper-outer quadrant of right female breast: Secondary | ICD-10-CM | POA: Diagnosis not present

## 2023-11-05 DIAGNOSIS — Z170 Estrogen receptor positive status [ER+]: Secondary | ICD-10-CM | POA: Diagnosis not present

## 2023-11-05 DIAGNOSIS — Z1722 Progesterone receptor negative status: Secondary | ICD-10-CM | POA: Diagnosis not present

## 2023-11-05 DIAGNOSIS — Z510 Encounter for antineoplastic radiation therapy: Secondary | ICD-10-CM | POA: Diagnosis not present

## 2023-11-05 LAB — RAD ONC ARIA SESSION SUMMARY
Course Elapsed Days: 27
Plan Fractions Treated to Date: 2
Plan Prescribed Dose Per Fraction: 2 Gy
Plan Total Fractions Prescribed: 4
Plan Total Prescribed Dose: 8 Gy
Reference Point Dosage Given to Date: 4 Gy
Reference Point Session Dosage Given: 2 Gy
Session Number: 18

## 2023-11-06 ENCOUNTER — Ambulatory Visit

## 2023-11-06 ENCOUNTER — Other Ambulatory Visit: Payer: Self-pay

## 2023-11-06 ENCOUNTER — Ambulatory Visit
Admission: RE | Admit: 2023-11-06 | Discharge: 2023-11-06 | Disposition: A | Discharge status: Home / Self Care | Source: Ambulatory Visit | Attending: Radiation Oncology | Admitting: Radiation Oncology

## 2023-11-06 DIAGNOSIS — Z1722 Progesterone receptor negative status: Secondary | ICD-10-CM | POA: Diagnosis not present

## 2023-11-06 DIAGNOSIS — Z170 Estrogen receptor positive status [ER+]: Secondary | ICD-10-CM | POA: Diagnosis not present

## 2023-11-06 DIAGNOSIS — C50411 Malignant neoplasm of upper-outer quadrant of right female breast: Secondary | ICD-10-CM | POA: Diagnosis not present

## 2023-11-06 DIAGNOSIS — M545 Low back pain, unspecified: Secondary | ICD-10-CM | POA: Diagnosis not present

## 2023-11-06 DIAGNOSIS — Z1732 Human epidermal growth factor receptor 2 negative status: Secondary | ICD-10-CM | POA: Diagnosis not present

## 2023-11-06 DIAGNOSIS — Z510 Encounter for antineoplastic radiation therapy: Secondary | ICD-10-CM | POA: Diagnosis not present

## 2023-11-06 LAB — RAD ONC ARIA SESSION SUMMARY
Course Elapsed Days: 28
Plan Fractions Treated to Date: 3
Plan Prescribed Dose Per Fraction: 2 Gy
Plan Total Fractions Prescribed: 4
Plan Total Prescribed Dose: 8 Gy
Reference Point Dosage Given to Date: 6 Gy
Reference Point Session Dosage Given: 2 Gy
Session Number: 19

## 2023-11-07 ENCOUNTER — Ambulatory Visit
Admission: RE | Admit: 2023-11-07 | Discharge: 2023-11-07 | Disposition: A | Discharge status: Home / Self Care | Source: Ambulatory Visit | Attending: Radiation Oncology | Admitting: Radiation Oncology

## 2023-11-07 ENCOUNTER — Ambulatory Visit

## 2023-11-07 ENCOUNTER — Other Ambulatory Visit: Payer: Self-pay

## 2023-11-07 DIAGNOSIS — Z1732 Human epidermal growth factor receptor 2 negative status: Secondary | ICD-10-CM | POA: Diagnosis not present

## 2023-11-07 DIAGNOSIS — Z1722 Progesterone receptor negative status: Secondary | ICD-10-CM | POA: Diagnosis not present

## 2023-11-07 DIAGNOSIS — Z510 Encounter for antineoplastic radiation therapy: Secondary | ICD-10-CM | POA: Diagnosis not present

## 2023-11-07 DIAGNOSIS — Z170 Estrogen receptor positive status [ER+]: Secondary | ICD-10-CM | POA: Diagnosis not present

## 2023-11-07 DIAGNOSIS — C50411 Malignant neoplasm of upper-outer quadrant of right female breast: Secondary | ICD-10-CM | POA: Diagnosis not present

## 2023-11-07 LAB — RAD ONC ARIA SESSION SUMMARY
Course Elapsed Days: 29
Plan Fractions Treated to Date: 4
Plan Prescribed Dose Per Fraction: 2 Gy
Plan Total Fractions Prescribed: 4
Plan Total Prescribed Dose: 8 Gy
Reference Point Dosage Given to Date: 8 Gy
Reference Point Session Dosage Given: 2 Gy
Session Number: 20

## 2023-11-10 ENCOUNTER — Ambulatory Visit

## 2023-11-10 NOTE — Radiation Completion Notes (Signed)
  Radiation Oncology         [phone] ________________________________  Name: Morgan Norman MRN: [id number]  Date of Service: 11/07/2023  DOB: [birth year]  End of Treatment Note   Diagnosis: Stage IA, pT2N0M0, grade 2 ER positive invasive ductal carcinoma of the right breast.   Intent: Curative     ==========DELIVERED PLANS==========  First Treatment Date: 2023-10-09 Last Treatment Date: 2023-11-07   Plan Name: Breast_R Site: Breast, Right Technique: 3D Mode: Photon Dose Per Fraction: 2.66 Gy Prescribed Dose (Delivered / Prescribed): 42.56 Gy / 42.56 Gy Prescribed Fxs (Delivered / Prescribed): 16 / 16   Plan Name: Breast_R_Bst Site: Breast, Right Technique: 3D Mode: Photon Dose Per Fraction: 2 Gy Prescribed Dose (Delivered / Prescribed): 8 Gy / 8 Gy Prescribed Fxs (Delivered / Prescribed): 4 / 4     ==========ON TREATMENT VISIT DATES========== 2023-10-10, 2023-10-20, 2023-10-28, 2023-11-07   See weekly On Treatment Notes in Epic for details in the Media tab (listed as Progress notes on the On Treatment Visit Dates listed above). The patient tolerated radiation. She developed fatigue and anticipated skin changes in the treatment field and some dermatitis more prominently in the inframammary fold  The patient will receive a call in about one month from the radiation oncology department. She will continue follow up with Dr. Gudena as well.      Donald KYM Husband, PAC

## 2023-11-11 DIAGNOSIS — M545 Low back pain, unspecified: Secondary | ICD-10-CM | POA: Diagnosis not present

## 2023-11-11 NOTE — Progress Notes (Signed)
 Daily Treatment  Patient Name: Morgan Norman Date of Birth: Apr 10, 1956 Today's Date: November 11, 2023 Referring Provider: Durwin Christ, NP Visit #: 4 of 10 Start of Care Date: October 15, 2023  Certification Period/POC: 10/15/23 to 01/15/24  Date for Progress Report: 11/14/23 or by visit #10   Precautions:  Currently undergoing radiation for treatment of breast ca.   PT Diagnosis: Low Back Pain  PT Prognosis: Good  Subjective/History  History of Present Illness The patient is a 68 y.o. presenting to physical therapy for evaluation and treatment of low back pain present for at least 6 months. She has received an injection for pain management that was helpful.Patient is currently receiving radiation for treatment of breast ca. 5 days/week. Patient walks at the gym on the track.   Lumbar X-ray 07/23/23: IMPRESSION: 1.  No acute radiographic findings. 2.  Chronic L1 and L4 fractures. 3.  Multilevel degenerative changes.   Patient presents today stating she is feeling a little tired today, but her back isn't hurting.  Pain Low Back 1/10  Objective  Verbal consent prior to palpation, special testing and other physical assessment was obtained: YES  Patient Specific Functional Scale from Initial Evaluation The PSFS is a reliable and valid outcome measurement tool which allows clinicians to determine activity limitations and functional ability. The PSFS can be used over a large number of patients with varying degrees of independence and diagnoses. Activity 1 Score: 0 (Patient unable to sit comfortably at the computer without low back pain (15 min tolerance)) Activity 2 Score: 4 (Patient unable to walk as far as she used to due to pain) PSFS Score: 2  PSFS score generated by patient based on perceived functional deficits.        No measurements taken  Today's Treatment/Intervention    Interventions Performed    Anything left blank was deferred at this time During all sensitive  area treatments, patient provided continual verbal consent before and during each intervention.  CC: LBP    Therapeutic Ex (97110):  Seated stepper seat 18, level 3.0 x 7 minutes Leg press 81# 3 x 15 Half kneel side chops (staying upright due to osteoporosis) 2 x 10 B, 7.5# at cable column  Knees down plank 10 sec x 5 Regular plank 10 sec x ~5  Tried side plank but pt unable due to weakness in her R shoulder.  LTR x ~10 Bridge with BLEs on smaller blue theraball 3 sec holds 2 x 10    Therapeutic Act (02469):    Manual (02859):    Neuro Re-Ed (02887):    Gait Training [phone]):    Aquatic Therapy [phone]):  Modalities:  No modalities used at this time  Education:  The patient was educated regarding: therapy diagnosis, anatomy and physiology, treatment plan, rationale, therapeutic goals, expectations, and home exercise program. All patient questions were entertained and answered today.  The education provided was required by a skilled therapist through active lecturing and/or demonstration and the time spent was included in the appropriate/correlative billed codes.    HEP Provided:    Access Code: [email or web address] URL: [email or web address] Date: 10/15/2023 Prepared by: Shermeca Jackson   Exercises - Standing Lumbar Spine Flexion Stretch Counter  - 2 x daily - 7 x weekly - 1 sets - 5 reps - Seated Lumbar Flexion Stretch  - 2 x daily - 7 x weekly - 1 sets - 3 reps - 10 hold - Seated Piriformis Stretch with Trunk Bend  - 2 x daily -  7 x weekly - 1 sets - 5 reps - 10 hold - Cat to Child's Pose with Posterior Pelvic Tilt  - 2 x daily - 7 x weekly - 1 sets - 5 reps - 10 hold - Supine Lower Trunk Rotation  - 2 x daily - 7 x weekly - 1 sets - 10 reps - 5 hold  Skilled PT required for exercise performance to ensure proper technique for continued safe & effective progression.  Each CPT code is separate and distinct therefore medically necessary.  Goals   Start Date: October 15, 2023 End date: 01/15/24 Patient Goal: Learn exercises that will be helpful in managing pain.   Pt will be independent with HEP to improve their ability to self-manage symptoms and reduce risk of recurrence. Pt will increase bilateral hip ROM to passive hip flexion to at least 110 degrees to enable performance of functional tasks and ADLs with less difficulty. Pt will report 2/10 or less pain with functional activity and less occurrence of sleep disturbance d/t pain. Pt will improve PSFS average score from 2 at initial evaluation by >/= 5 in order to demonstrate functional improvement.    Assessment   Assessment Details: The patient presents to physical therapy today for treatment of low back pain. Continued core strengthening progressions as tolerated. Some difficulty with planks (see in ther ex notes). No problems with other exercise advancements. No c/o pain post session. She would benefit from continued skilled PT treatment to work towards goals.  The plan of care has been certified by a provider on 10/15/23 date.  The patient requires continued skilled Physical Therapy services through the use of planned therapy and modality interventions listed below in order to address the impairments mentioned above/at initial evaluation.  Services to be provided are reasonable and medically necessary for the diagnosis and/or treatment of illness/injury in order to improve the function.  Services to be provided are of appropriate amount, duration and frequency within accepted standards of medical practice per the diagnosis.  Upon system's review, no comorbid conditions were identified to contraindicate therapeutic interventions.   Plan   Plan for next session:  Continue to progress as able.   Therapy options:  Frequency: 1x/week Duration of Time: 6 weeks   Planned Interventions:  Planned Interventions: Cupping, Home Exercise Program, Manual Therapy IASTM, Joint Mobilizations, Myofascial  Release, and STM , Neuromuscular Re-education, Self Care/Home Management/Education, Therapeutic Activities, and Therapeutic Exercises Planned Modalities: Cryotherapy, Electrical Stimulation (Attended or Unattended), and Thermotherapy  Patient/caregiver reported no cultural and/or religious practices that should be considered in patient's treatment plan. Patient or caregiver informed and in agreement with treatment plan risks and benefits.  Treatment Time   Today's Evaluation/Treatment: 1645 - 1730 Total Time: 45 min  Charges   Total Time Code Treatment Minutes: 45  PT Therapeutic Time Entry Therapeutic Exercise Time Entry: 45 (3 units)  Any remaining time not accounted for includes time for set up/demonstration, reasoning for exercise and technique correction.   Vicenta Devonshire, PTA 11/11/2023 5:44 PM  [facility] [place] [facility] [address] [place] [address] Dept: [phone] Dept Fax: [phone]  Two patient identifiers, Name and DOB, were confirmed prior to the initiation of therapy this date.  If patient returns to clinic with variance in plan of care, then it may be attributable to one or more of the following factors: preferred clinician availability, appointment time request availability, therapy/pool appointment availability, major holiday with clinic closure, caregiver availability, patient transportation, conflicting medical  appointment, inclement weather, patient illness, and/or scheduling error.  Note: If patient does not return for follow up visit(s) related to this episode of care, this note will serve as their discharge note from physical therapy, as the patient has discharged themselves and a status on updated goals or PSFS is not available. Refer to previous note for discharge diagnosis and prognosis.  Portions of this note may have been entered using voice recognition software. Minor syntax, contextual, and spelling errors  may be related to the use of this software and were not intentional. If corrections are necessary, please contact provider.

## 2023-11-18 DIAGNOSIS — M545 Low back pain, unspecified: Secondary | ICD-10-CM | POA: Diagnosis not present

## 2023-12-01 ENCOUNTER — Ambulatory Visit: Payer: Medicare PPO | Admitting: Internal Medicine

## 2023-12-03 ENCOUNTER — Ambulatory Visit

## 2023-12-05 ENCOUNTER — Ambulatory Visit

## 2023-12-05 DIAGNOSIS — C50911 Malignant neoplasm of unspecified site of right female breast: Secondary | ICD-10-CM | POA: Diagnosis not present

## 2023-12-08 ENCOUNTER — Ambulatory Visit
Admission: RE | Admit: 2023-12-08 | Discharge: 2023-12-08 | Disposition: A | Discharge status: Home / Self Care | Source: Ambulatory Visit | Attending: Hematology and Oncology | Admitting: Hematology and Oncology

## 2023-12-08 DIAGNOSIS — Z170 Estrogen receptor positive status [ER+]: Secondary | ICD-10-CM | POA: Insufficient documentation

## 2023-12-08 DIAGNOSIS — C50411 Malignant neoplasm of upper-outer quadrant of right female breast: Secondary | ICD-10-CM | POA: Insufficient documentation

## 2023-12-08 NOTE — Progress Notes (Signed)
  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  2023-10-09 - 2023-11-07  The patient was available for call today.   Symptoms of mild fatigue have not improved since completing therapy. She feels like it is still lingering, but continues to go to the gym.   Symptoms of skin changes have improved since completing therapy.  The patient was encouraged to avoid sun exposure in the area of prior treatment for up to one year following radiation with either sunscreen or by the style of clothing worn in the sun.  The patient has scheduled follow up with her medical oncologist Dr. Gudena for ongoing surveillance, and was encouraged to call if she develops concerns or questions regarding radiation.   No vitals were taking for this visit.

## 2023-12-09 NOTE — Progress Notes (Signed)
 I,Morgan Norman, CMA,acting as a Neurosurgeon for Morgan LOISE Slocumb, MD.,have documented all relevant documentation on the behalf of Morgan LOISE Slocumb, MD,as directed by  Morgan LOISE Slocumb, MD while in the presence of Morgan LOISE Slocumb, MD.  Subjective:    Patient ID: Morgan Norman , female    DOB: 07-23-1955 , 68 y.o.   MRN: [id number]  Chief Complaint  Patient presents with   Annual Exam    Patient presents today for annual exam. She reports compliance with medications. Denies headache, chest pain & sob.     Hypertension    HPI Discussed the use of AI scribe software for clinical note transcription with the patient, who gave verbal consent to proceed.  History of Present Illness ELIM PEALE is a 68 year old female with breast cancer who presents for a follow-up and physical exam.  She has a history of stage one estrogen receptor-positive breast cancer in the right breast. She underwent a lumpectomy followed by twenty sessions of radiation therapy. She is currently on tamoxifen  20 mg daily, having switched from anastrozole due to concerns about osteoporosis. She experiences hot flashes as a side effect of tamoxifen . No chemotherapy was administered. She reports that her breast is doing okay after radiation, with no pain and cessation of peeling, and she continues to use cream and Aquaphor for skin care. She continues to use cream and Aquaphor for skin care.  She has a history of osteopenia, with the last bone density scan performed in January 2023 at [facility]. She recalls receiving an infusion at [facility], which caused discomfort described as 'hot' during administration. She is unsure if a bone density scan was performed at [facility].  She has a history of sacroiliitis, for which she received an injection and underwent physical therapy. She also mentions experiencing right shoulder pain, which she attributes to possibly sleeping incorrectly two nights ago.  She has not taken her  cholesterol medication for about a month due to running out and not refilling it. She expresses stress related to managing her late friend's estate, describing her friend as a Chartered loss adjuster.  She notes a decrease in appetite and some weight loss but states she eats when she feels like it. She has recently started exercising, walking at the gym and in her neighborhood.   Hypertension This is a chronic problem. The current episode started more than 1 year ago. The problem has been gradually improving since onset. The problem is controlled. Pertinent negatives include no blurred vision. The current treatment provides moderate improvement. There are no compliance problems.      Past Medical History:  Diagnosis Date   Benign hypertensive renal disease    Breast cancer (HCC) 07/31/2023   Right Breast   CKD (chronic kidney disease) stage 2, GFR 60-89 ml/min    Hypertension    Migraines    Overweight    Pure hypercholesterolemia    Sleep apnea    Vitamin D deficiency      Family History  Problem Relation Age of Onset   Diabetes Mellitus I Mother    Bladder Cancer Mother    Dementia Mother    Alcoholism Father      Current Outpatient Medications:    baclofen  (LIORESAL ) 10 MG tablet, Take 1 tablet (10 mg total) by mouth at bedtime as needed for muscle spasms., Disp: 30 tablet, Rfl: 1   Cholecalciferol 5000 UNITS TABS, Take 5,000 Units by mouth., Disp: , Rfl:    ELDERBERRY PO, Take 1,000  mg by mouth., Disp: , Rfl:    loratadine (CLARITIN) 10 MG tablet, Take 10 mg by mouth daily as needed for allergies., Disp: , Rfl:    meclizine  (ANTIVERT ) 25 MG tablet, Take 1 tablet (25 mg total) by mouth 3 (three) times daily as needed for dizziness., Disp: 30 tablet, Rfl: 0   meloxicam (MOBIC) 15 MG tablet, , Disp: , Rfl:    Multiple Vitamins-Minerals (IMMUNE SUPPORT PO), Take 1,000 mg by mouth daily at 6 (six) AM., Disp: , Rfl:    nystatin  cream (MYCOSTATIN ), Apply topically 3 (three) times daily., Disp:  30 g, Rfl: 1   Omega-3 Fatty Acids (FISH OIL PO), Take 120 mg by mouth., Disp: , Rfl:    pantoprazole  (PROTONIX ) 20 MG tablet, TAKE 1 TABLET(20 MG) BY MOUTH DAILY, Disp: 90 tablet, Rfl: 2   Pramoxine-HC (HYDROCORTISONE  ACE-PRAMOXINE) 2.5-1 % CREA, Apply 1 Application topically 2 (two) times daily as needed., Disp: 28.4 g, Rfl: 0   tamoxifen  (NOLVADEX ) 20 MG tablet, Take 1 tablet (20 mg total) by mouth daily., Disp: 90 tablet, Rfl: 3   traMADol  (ULTRAM ) 50 MG tablet, Take 1 tablet (50 mg total) by mouth every 6 (six) hours as needed for moderate pain (pain score 4-6) or severe pain (pain score 7-10)., Disp: 25 tablet, Rfl: 0   triamcinolone  cream (KENALOG ) 0.1 %, Apply 1 application. topically 2 (two) times daily., Disp: 30 g, Rfl: 1   rosuvastatin  (CRESTOR ) 10 MG tablet, TAKE 1 TABLET(10 MG) BY MOUTH DAILY, Disp: 90 tablet, Rfl: 2   telmisartan  (MICARDIS ) 20 MG tablet, TAKE 1 TABLET(20 MG) BY MOUTH DAILY, Disp: 90 tablet, Rfl: 2   valACYclovir  (VALTREX ) 500 MG tablet, Take 1 tablet (500 mg total) by mouth daily., Disp: 90 tablet, Rfl: 1   No Known Allergies    The patient states she uses status post hysterectomy for birth control. No LMP recorded. Patient has had a hysterectomy.. Negative for Dysmenorrhea. Negative for: breast discharge, breast lump(s), breast pain and breast self exam. Associated symptoms include abnormal vaginal bleeding. Pertinent negatives include abnormal bleeding (hematology), anxiety, decreased libido, depression, difficulty falling sleep, dyspareunia, history of infertility, nocturia, sexual dysfunction, sleep disturbances, urinary incontinence, urinary urgency, vaginal discharge and vaginal itching. Diet regular.The patient states her exercise level is    . The patient's tobacco use is:  Social History   Tobacco Use  Smoking Status Never  Smokeless Tobacco Never  . She has been exposed to passive smoke. The patient's alcohol use is:  Social History   Substance and  Sexual Activity  Alcohol Use Not Currently   Alcohol/week: 0.0 standard drinks of alcohol   Comment: rarely    Review of Systems  Constitutional: Negative.   HENT: Negative.    Eyes: Negative.  Negative for blurred vision.  Respiratory: Negative.    Cardiovascular: Negative.   Gastrointestinal: Negative.   Endocrine: Negative.   Genitourinary: Negative.   Musculoskeletal: Negative.   Skin: Negative.   Allergic/Immunologic: Negative.   Neurological: Negative.   Hematological: Negative.   Psychiatric/Behavioral: Negative.       Today's Vitals   12/10/23 0944  BP: 122/80  Pulse: 74  Temp: 98.2 F (36.8 C)  SpO2: 98%  Weight: 164 lb (74.4 kg)  Height: 5' 5 (1.651 m)   Body mass index is 27.29 kg/m.  Wt Readings from Last 3 Encounters:  12/10/23 164 lb (74.4 kg)  10/27/23 164 lb 11.2 oz (74.7 kg)  10/02/23 167 lb 6.4 oz (75.9 kg)  Objective:  Physical Exam Vitals and nursing note reviewed.  Constitutional:      Appearance: Normal appearance.  HENT:     Head: Normocephalic and atraumatic.     Right Ear: Tympanic membrane, ear canal and external ear normal.     Left Ear: Tympanic membrane, ear canal and external ear normal.     Nose: Nose normal.     Mouth/Throat:     Mouth: Mucous membranes are moist.     Pharynx: Oropharynx is clear.  Eyes:     Extraocular Movements: Extraocular movements intact.     Conjunctiva/sclera: Conjunctivae normal.     Pupils: Pupils are equal, round, and reactive to light.  Cardiovascular:     Rate and Rhythm: Normal rate and regular rhythm.     Pulses: Normal pulses.     Heart sounds: Normal heart sounds.  Pulmonary:     Effort: Pulmonary effort is normal.     Breath sounds: Normal breath sounds.  Chest:  Breasts:    Tanner Score is 5.     Right: Normal.     Left: Normal.     Comments: Healed surgical scar on right breast Thickening of skin over areola and surrounding skin Abdominal:     General: Abdomen is flat.  Bowel sounds are normal.     Palpations: Abdomen is soft.  Genitourinary:    Comments: deferred Musculoskeletal:        General: Normal range of motion.     Cervical back: Normal range of motion and neck supple.  Skin:    General: Skin is warm and dry.  Neurological:     General: No focal deficit present.     Mental Status: She is alert and oriented to person, place, and time.  Psychiatric:        Mood and Affect: Mood normal.        Behavior: Behavior normal.     Assessment And Plan:     Encounter for general adult medical examination w/o abnormal findings Assessment & Plan: A full exam was performed.  Importance of monthly self breast exams was discussed with the patient.  She is advised to get 30-45 minutes of regular exercise, no less than four to five days per week. Both weight-bearing and aerobic exercises are recommended.  She is advised to follow a healthy diet with at least six fruits/veggies per day, decrease intake of red meat and other saturated fats and to increase fish intake to twice weekly.  Meats/fish should not be fried -- baked, boiled or broiled is preferable. It is also important to cut back on your sugar intake.  Be sure to read labels - try to avoid anything with added sugar, high fructose corn syrup or other sweeteners.  If you must use a sweetener, you can try stevia or monkfruit.  It is also important to avoid artificially sweetened foods/beverages and diet drinks. Lastly, wear SPF 50 sunscreen on exposed skin and when in direct sunlight for an extended period of time.  Be sure to avoid fast food restaurants and aim for at least 60 ounces of water daily.       Hypertensive nephropathy Assessment & Plan: Chronic, controlled.  EKG performed, NSR w/o acute changes.  She will continue with telmisartan  20mg  daily. She is encouraged to follow low sodium diet. She will f/u in six months for re-evaluation.   Orders: -     CBC -     CMP14+EGFR -     Lipid panel -  EKG 12-Lead -     Microalbumin / creatinine urine ratio -     POCT URINALYSIS DIP (CLINITEK)  Chronic renal disease, stage II Assessment & Plan: Chronic, she is encouraged to keep BP well controlled and to stay well hydrated to decrease risk of CKD progression.    Malignant neoplasm of upper-outer quadrant of right breast in female, estrogen receptor positive [facility]) Assessment & Plan: She is s/p lumpectomy on right breast. Treatment plan included adjuvant radiation therapy 10/10/23- 11/07/23 and adjuvant antiestrogen therapy. She is currently on tamoxifen  due to underlying osteoporosis.     Pure hypercholesterolemia Assessment & Plan: Hyperlipidemia with a lapse in medication for approximately one month. - Refill cholesterol medication prescription.   Osteopenia of neck of right femur Assessment & Plan: Most recent bone density shows osteopenia in right femoral neck. She is followed at Osteoporosis clinic at [facility].  Orders: -     DG Bone Density; Future  Other abnormal glucose Assessment & Plan: Previous labs reviewed, her A1c has been elevated in the past. I will check an A1c today. Reminded to avoid refined sugars including sugary drinks/foods and processed meats including bacon, sausages and deli meats.    Orders: -     CMP14+EGFR -     Hemoglobin A1c  Overweight with body mass index (BMI) of 27 to 27.9 in adult Assessment & Plan: Her BMI is acceptable for her demographic. She is encouraged to aim for at least 150 minutes of exercise per week.    Other orders -     Rosuvastatin  Calcium ; TAKE 1 TABLET(10 MG) BY MOUTH DAILY  Dispense: 90 tablet; Refill: 2 -     valACYclovir  HCl; Take 1 tablet (500 mg total) by mouth daily.  Dispense: 90 tablet; Refill: 1 -     Telmisartan ; TAKE 1 TABLET(20 MG) BY MOUTH DAILY  Dispense: 90 tablet; Refill: 2   Return in 6 months (on 06/11/2024) for 1 year HM. Patient was given opportunity to ask questions. Patient verbalized understanding  of the plan and was able to repeat key elements of the plan. All questions were answered to their satisfaction.   I, Morgan LOISE Slocumb, MD, have reviewed all documentation for this visit. The documentation on 12/10/23 for the exam, diagnosis, procedures, and orders are all accurate and complete.

## 2023-12-09 NOTE — Patient Instructions (Signed)

## 2023-12-10 ENCOUNTER — Encounter: Payer: Self-pay | Admitting: Internal Medicine

## 2023-12-10 ENCOUNTER — Ambulatory Visit (INDEPENDENT_AMBULATORY_CARE_PROVIDER_SITE_OTHER): Payer: Self-pay | Admitting: Internal Medicine

## 2023-12-10 VITALS — BP 122/80 | HR 74 | Temp 98.2°F | Ht 65.0 in | Wt 164.0 lb

## 2023-12-10 DIAGNOSIS — R7309 Other abnormal glucose: Secondary | ICD-10-CM

## 2023-12-10 DIAGNOSIS — E663 Overweight: Secondary | ICD-10-CM | POA: Diagnosis not present

## 2023-12-10 DIAGNOSIS — M85851 Other specified disorders of bone density and structure, right thigh: Secondary | ICD-10-CM | POA: Diagnosis not present

## 2023-12-10 DIAGNOSIS — N182 Chronic kidney disease, stage 2 (mild): Secondary | ICD-10-CM

## 2023-12-10 DIAGNOSIS — M8580 Other specified disorders of bone density and structure, unspecified site: Secondary | ICD-10-CM

## 2023-12-10 DIAGNOSIS — I129 Hypertensive chronic kidney disease with stage 1 through stage 4 chronic kidney disease, or unspecified chronic kidney disease: Secondary | ICD-10-CM

## 2023-12-10 DIAGNOSIS — E7800 Pure hypercholesterolemia, unspecified: Secondary | ICD-10-CM

## 2023-12-10 DIAGNOSIS — C50411 Malignant neoplasm of upper-outer quadrant of right female breast: Secondary | ICD-10-CM | POA: Diagnosis not present

## 2023-12-10 DIAGNOSIS — Z0000 Encounter for general adult medical examination without abnormal findings: Secondary | ICD-10-CM | POA: Diagnosis not present

## 2023-12-10 DIAGNOSIS — Z170 Estrogen receptor positive status [ER+]: Secondary | ICD-10-CM

## 2023-12-10 DIAGNOSIS — Z6827 Body mass index (BMI) 27.0-27.9, adult: Secondary | ICD-10-CM | POA: Diagnosis not present

## 2023-12-10 LAB — POCT URINALYSIS DIP (CLINITEK)
Bilirubin, UA: NEGATIVE
Glucose, UA: NEGATIVE mg/dL
Ketones, POC UA: NEGATIVE mg/dL
Leukocytes, UA: NEGATIVE
Nitrite, UA: NEGATIVE
Spec Grav, UA: 1.01 (ref 1.010–1.025)
Urobilinogen, UA: 0.2 U/dL
pH, UA: 6 (ref 5.0–8.0)

## 2023-12-10 MED ORDER — TELMISARTAN 20 MG PO TABS: ORAL_TABLET | ORAL | 2 refills | Status: AC

## 2023-12-10 MED ORDER — VALACYCLOVIR HCL 500 MG PO TABS: 500.0000 mg | ORAL_TABLET | Freq: Every day | ORAL | 1 refills | Status: AC

## 2023-12-10 MED ORDER — ROSUVASTATIN CALCIUM 10 MG PO TABS: ORAL_TABLET | ORAL | 2 refills | Status: AC

## 2023-12-10 NOTE — Assessment & Plan Note (Signed)
 Previous labs reviewed, her A1c has been elevated in the past. I will check an A1c today. Reminded to avoid refined sugars including sugary drinks/foods and processed meats including bacon, sausages and deli meats.

## 2023-12-10 NOTE — Assessment & Plan Note (Signed)
 Chronic, she is encouraged to keep BP well controlled and to stay well hydrated to decrease risk of CKD progression.

## 2023-12-10 NOTE — Assessment & Plan Note (Signed)

## 2023-12-11 ENCOUNTER — Ambulatory Visit: Payer: Self-pay | Admitting: Internal Medicine

## 2023-12-11 LAB — LIPID PANEL
Chol/HDL Ratio: 3.4 ratio (ref 0.0–4.4)
Cholesterol, Total: 181 mg/dL (ref 100–199)
HDL: 53 mg/dL (ref >39)
LDL Chol Calc (NIH): 118 mg/dL — ABNORMAL HIGH (ref 0–99)
Triglycerides: 54 mg/dL (ref 0–149)
VLDL Cholesterol Cal: 10 mg/dL (ref 5–40)

## 2023-12-11 LAB — CMP14+EGFR
ALT: 11 IU/L (ref 0–32)
AST: 21 IU/L (ref 0–40)
Albumin: 4.5 g/dL (ref 3.9–4.9)
Alkaline Phosphatase: 58 IU/L (ref 44–121)
BUN/Creatinine Ratio: 16 (ref 12–28)
BUN: 13 mg/dL (ref 8–27)
Bilirubin Total: 0.4 mg/dL (ref 0.0–1.2)
CO2: 23 mmol/L (ref 20–29)
Calcium: 9 mg/dL (ref 8.7–10.3)
Chloride: 101 mmol/L (ref 96–106)
Creatinine, Ser: 0.81 mg/dL (ref 0.57–1.00)
Globulin, Total: 2.4 g/dL (ref 1.5–4.5)
Glucose: 89 mg/dL (ref 70–99)
Potassium: 3.9 mmol/L (ref 3.5–5.2)
Sodium: 141 mmol/L (ref 134–144)
Total Protein: 6.9 g/dL (ref 6.0–8.5)
eGFR: 78 mL/min/1.73 (ref >59)

## 2023-12-11 LAB — HEMOGLOBIN A1C
Est. average glucose Bld gHb Est-mCnc: 108 mg/dL
Hgb A1c MFr Bld: 5.4 % (ref 4.8–5.6)

## 2023-12-11 LAB — CBC
Hematocrit: 40.6 % (ref 34.0–46.6)
Hemoglobin: 14.3 g/dL (ref 11.1–15.9)
MCH: 32.9 pg (ref 26.6–33.0)
MCHC: 35.2 g/dL (ref 31.5–35.7)
MCV: 94 fL (ref 79–97)
Platelets: 227 x10E3/uL (ref 150–450)
RBC: 4.34 x10E6/uL (ref 3.77–5.28)
RDW: 12.4 % (ref 11.7–15.4)
WBC: 3.2 x10E3/uL — ABNORMAL LOW (ref 3.4–10.8)

## 2023-12-11 LAB — MICROALBUMIN / CREATININE URINE RATIO
Creatinine, Urine: 211.7 mg/dL
Microalb/Creat Ratio: 5 mg/g{creat} (ref 0–29)
Microalbumin, Urine: 10.6 ug/mL

## 2023-12-12 ENCOUNTER — Ambulatory Visit

## 2023-12-12 DIAGNOSIS — Z0000 Encounter for general adult medical examination without abnormal findings: Secondary | ICD-10-CM

## 2023-12-12 NOTE — Patient Instructions (Signed)

## 2023-12-12 NOTE — Progress Notes (Signed)
 Subjective:   Morgan Norman is a 68 y.o. female who presents for Medicare Annual (Subsequent) preventive examination.  Visit Complete: virtual   Patient Medicare AWV questionnaire was completed by the patient on 12/12/23; I have confirmed that all information answered by patient is correct and no changes since this date.  Cardiac Risk Factors include: hypertension     Objective:    There were no vitals filed for this visit. There is no height or weight on file to calculate BMI.     10/27/2023   11:09 AM 10/02/2023    9:16 AM 09/01/2023    7:08 AM 08/26/2023    9:35 AM 11/07/2021    2:16 PM 10/11/2020   12:00 PM 10/07/2019   11:37 AM  Advanced Directives  Does Patient Have a Medical Advance Directive? No Yes Yes Yes Yes Yes Yes  Type of Advance Directive  Living will  Living will;Healthcare Power of [employer] Power of [place];Living will Healthcare Power of [place];Living will Healthcare Power of [place];Living will  Does patient want to make changes to medical advance directive?  No - Patient declined No - Patient declined No - Patient declined     Copy of Healthcare Power of Attorney in Chart?    No - copy requested No - copy requested No - copy requested No - copy requested    Current Medications (verified) Outpatient Encounter Medications as of 12/12/2023  Medication Sig   baclofen  (LIORESAL ) 10 MG tablet Take 1 tablet (10 mg total) by mouth at bedtime as needed for muscle spasms.   Cholecalciferol 5000 UNITS TABS Take 5,000 Units by mouth.   ELDERBERRY PO Take 1,000 mg by mouth.   loratadine (CLARITIN) 10 MG tablet Take 10 mg by mouth daily as needed for allergies.   meclizine  (ANTIVERT ) 25 MG tablet Take 1 tablet (25 mg total) by mouth 3 (three) times daily as needed for dizziness.   meloxicam (MOBIC) 15 MG tablet    Multiple Vitamins-Minerals (IMMUNE SUPPORT PO) Take 1,000 mg by mouth daily at 6 (six) AM.   nystatin  cream (MYCOSTATIN ) Apply topically 3  (three) times daily.   Omega-3 Fatty Acids (FISH OIL PO) Take 120 mg by mouth.   pantoprazole  (PROTONIX ) 20 MG tablet TAKE 1 TABLET(20 MG) BY MOUTH DAILY   Pramoxine-HC (HYDROCORTISONE  ACE-PRAMOXINE) 2.5-1 % CREA Apply 1 Application topically 2 (two) times daily as needed.   rosuvastatin  (CRESTOR ) 10 MG tablet TAKE 1 TABLET(10 MG) BY MOUTH DAILY   tamoxifen  (NOLVADEX ) 20 MG tablet Take 1 tablet (20 mg total) by mouth daily.   telmisartan  (MICARDIS ) 20 MG tablet TAKE 1 TABLET(20 MG) BY MOUTH DAILY   traMADol  (ULTRAM ) 50 MG tablet Take 1 tablet (50 mg total) by mouth every 6 (six) hours as needed for moderate pain (pain score 4-6) or severe pain (pain score 7-10).   triamcinolone  cream (KENALOG ) 0.1 % Apply 1 application. topically 2 (two) times daily.   valACYclovir  (VALTREX ) 500 MG tablet Take 1 tablet (500 mg total) by mouth daily.   No facility-administered encounter medications on file as of 12/12/2023.    Allergies (verified) Patient has no known allergies.   History: Past Medical History:  Diagnosis Date   Benign hypertensive renal disease    Breast cancer (HCC) 07/31/2023   Right Breast   CKD (chronic kidney disease) stage 2, GFR 60-89 ml/min    Hypertension    Migraines    Overweight    Pure hypercholesterolemia    Sleep apnea  Vitamin D deficiency    Past Surgical History:  Procedure Laterality Date   bilateral foot surgery     BREAST BIOPSY Right 07/31/2023   US  RT BREAST BX W LOC DEV 1ST LESION IMG BX SPEC US  GUIDE 07/31/2023 [facility]   BREAST BIOPSY  08/29/2023   US  RT RADIOACTIVE SEED LOC 08/29/2023 [facility]   BREAST LUMPECTOMY WITH RADIOACTIVE SEED LOCALIZATION Right 09/01/2023   Procedure: BREAST LUMPECTOMY WITH RADIOACTIVE SEED LOCALIZATION;  Surgeon: Vernetta Berg, MD;  Location: [facility];  Service: General;  Laterality: Right;  LMA RADIOACTIVE SEED GUIDED RIGHT BREAST LUMPECTOMY   CESAREAN SECTION     TOTAL ABDOMINAL  HYSTERECTOMY W/ BILATERAL SALPINGOOPHORECTOMY     Family History  Problem Relation Age of Onset   Diabetes Mellitus I Mother    Bladder Cancer Mother    Dementia Mother    Alcoholism Father    Social History   Socioeconomic History   Marital status: Married    Spouse name: Not on file   Number of children: Not on file   Years of education: Not on file   Highest education level: Bachelor's degree (e.g., BA, AB, BS)  Occupational History   Occupation: retired  Tobacco Use   Smoking status: Never   Smokeless tobacco: Never  Vaping Use   Vaping status: Never Used  Substance and Sexual Activity   Alcohol use: Not Currently    Alcohol/week: 0.0 standard drinks of alcohol    Comment: rarely   Drug use: No   Sexual activity: Yes  Other Topics Concern   Not on file  Social History Narrative   Drinks 2 cups of coffee daily.   Social Drivers of Corporate investment banker Strain: Low Risk  (12/06/2023)   Overall Financial Resource Strain (CARDIA)    Difficulty of Paying Living Expenses: Not hard at all  Food Insecurity: No Food Insecurity (12/06/2023)   Hunger Vital Sign    Worried About Running Out of Food in the Last Year: Never true    Ran Out of Food in the Last Year: Never true  Transportation Needs: No Transportation Needs (12/06/2023)   PRAPARE - Administrator, Civil Service (Medical): No    Lack of Transportation (Non-Medical): No  Physical Activity: Sufficiently Active (12/06/2023)   Exercise Vital Sign    Days of Exercise per Week: 3 days    Minutes of Exercise per Session: 60 min  Stress: No Stress Concern Present (12/06/2023)   [employer] of Occupational Health - Occupational Stress Questionnaire    Feeling of Stress: Not at all  Social Connections: Socially Integrated (12/06/2023)   Social Connection and Isolation Panel    Frequency of Communication with Friends and Family: More than three times a week    Frequency of Social Gatherings with  Friends and Family: Once a week    Attends Religious Services: More than 4 times per year    Active Member of [employer] or Organizations: Yes    Attends Banker Meetings: 1 to 4 times per year    Marital Status: Married    Tobacco Counseling Counseling given: Not Answered   Clinical Intake:  Pre-visit preparation completed: Yes  Pain : No/denies pain     Nutritional Risks: None Diabetes: No  How often do you need to have someone help you when you read instructions, pamphlets, or other written materials from your doctor or pharmacy?: 1 - Never What is the last grade level  you completed in school?: some college  Interpreter Needed?: No  Information entered by :: [place] [facility]   Activities of Daily Living    12/12/2023   10:15 AM 09/01/2023    7:11 AM  In your present state of health, do you have any difficulty performing the following activities:  Hearing? 0 0  Vision? 0 1  Difficulty concentrating or making decisions? 0 0  Walking or climbing stairs? 0   Dressing or bathing? 0   Doing errands, shopping? 0   Preparing Food and eating ? N   Using the Toilet? N   In the past six months, have you accidently leaked urine? N   Do you have problems with loss of bowel control? N   Managing your Medications? N   Managing your Finances? N   Housekeeping or managing your Housekeeping? N     Patient Care Team: Jarold Medici, MD as PCP - General (Internal Medicine) Tyree Nanetta SAILOR, RN as Oncology Nurse Navigator Glean, Stephane BROCKS, RN (Inactive) as Registered Nurse Odean Potts, MD as Consulting Physician (Hematology and Oncology) Vernetta Berg, MD as Consulting Physician (General Surgery)  Indicate any recent Medical Services you may have received from other than [facility] providers in the past year (date may be approximate).     Assessment:   This is a routine wellness examination for [facility]  Hearing/Vision screen No results found.   Goals Addressed   None     Depression Screen    12/12/2023   10:18 AM 07/10/2023    2:34 PM 12/05/2022    9:02 AM 11/28/2022   11:29 AM 08/13/2022   10:00 AM 05/27/2022    9:02 AM 11/07/2021    2:17 PM  PHQ 2/9 Scores  PHQ - 2 Score 0 0 0 0 0 0 0  PHQ- 9 Score  0 0 0 0      Fall Risk    12/12/2023   10:16 AM 12/10/2023    9:47 AM 07/10/2023    2:34 PM 12/05/2022    9:02 AM 11/28/2022   11:29 AM  Fall Risk   Falls in the past year? 0 0 0 0 0  Number falls in past yr: 0 0 0 0 0  Injury with Fall? 0 0 0 0 0  Risk for fall due to : No Fall Risks No Fall Risks No Fall Risks No Fall Risks Medication side effect  Follow up Falls evaluation completed Falls evaluation completed Falls evaluation completed Falls evaluation completed Falls prevention discussed;Falls evaluation completed    MEDICARE RISK AT HOME: Medicare Risk at Home Any stairs in or around the home?: Yes If so, are there any without handrails?: Yes Home free of loose throw rugs in walkways, pet beds, electrical cords, etc?: Yes Adequate lighting in your home to reduce risk of falls?: Yes Life alert?: No Use of a cane, walker or w/c?: No Grab bars in the bathroom?: No Shower chair or bench in shower?: No Elevated toilet seat or a handicapped toilet?: No  Cognitive Function:        12/12/2023   10:19 AM 11/28/2022   11:30 AM 11/07/2021    2:18 PM 10/11/2020   12:02 PM 10/07/2019   11:40 AM  6CIT Screen  What Year? 0 points 0 points 0 points 0 points 0 points  What month? 0 points 0 points 0 points 0 points 0 points  What time? 0 points 0 points 0 points 0 points 0 points  Count back from  20 0 points 0 points 0 points 0 points 0 points  Months in reverse 0 points 0 points 0 points 0 points 0 points  Repeat phrase 2 points 2 points 0 points 2 points 0 points  Total Score 2 points 2 points 0 points 2 points 0 points    Immunizations Immunization History  Administered Date(s) Administered   DTaP 10/19/2013   Fluad Quad(high Dose 65+)  01/25/2019, 03/20/2020   Influenza,inj,Quad PF,6+ Mos 06/23/2018   Influenza-Unspecified 01/09/2022   PFIZER(Purple Top)SARS-COV-2 Vaccination 05/18/2019, 06/10/2019, 01/19/2020, 12/27/2020   PNEUMOCOCCAL CONJUGATE-20 11/22/2021   Pneumococcal Polysaccharide-23 12/05/2014   Td 08/09/2005   Zoster Recombinant(Shingrix ) 05/18/2021, 10/26/2021    TDAP status: Up to date  Flu Vaccine status: Up to date  Pneumococcal vaccine status: Up to date  Covid-19 vaccine status: Completed vaccines  Qualifies for Shingles Vaccine? Yes   Zostavax completed Yes   Shingrix  Completed?: Yes  Screening Tests Health Maintenance  Topic Date Due   COVID-19 Vaccine (5 - 2024-25 season) 12/22/2022   INFLUENZA VACCINE  11/21/2023   DTaP/Tdap/Td (3 - Tdap) 12/09/2024 (Originally 10/20/2023)   Medicare Annual Wellness (AWV)  12/11/2024   MAMMOGRAM  07/08/2025   Colonoscopy  04/01/2028   Pneumococcal Vaccine: 50+ Years  Completed   DEXA SCAN  Completed   Hepatitis C Screening  Completed   Zoster Vaccines- Shingrix   Completed   HPV VACCINES  Aged Out   Meningococcal B Vaccine  Aged Out    Health Maintenance  Health Maintenance Due  Topic Date Due   COVID-19 Vaccine (5 - 2024-25 season) 12/22/2022   INFLUENZA VACCINE  11/21/2023    Colorectal cancer screening: Type of screening: Colonoscopy. Completed 04/01/2018. Repeat every 10 years  Mammogram status: Completed 07/09/23. Repeat every year  Bone Density status: Completed 05/04/21. Results reflect: Bone density results: OSTEOPENIA. Repeat every 3 years.  Lung Cancer Screening: (Low Dose CT Chest recommended if Age 46-80 years, 20 pack-year currently smoking OR have quit w/in 15years.) does not qualify.    Additional Screening:  Hepatitis C Screening: does qualify; Completed 08/24/2012   Dental Screening: Recommended annual dental exams for proper oral hygiene   Community Resource Referral / Chronic Care Management: CRR required this  visit?  No   CCM required this visit?  No     Plan:     I have personally reviewed and noted the following in the patient's chart:   Medical and social history Use of alcohol, tobacco or illicit drugs  Current medications and supplements including opioid prescriptions. Patient is not currently taking opioid prescriptions. Functional ability and status Nutritional status Physical activity Advanced directives List of other physicians Hospitalizations, surgeries, and ER visits in previous 12 months Vitals Screenings to include cognitive, depression, and falls Referrals and appointments  In addition, I have reviewed and discussed with patient certain preventive protocols, quality metrics, and best practice recommendations. A written personalized care plan for preventive services as well as general preventive health recommendations were provided to patient.     Morgan Norman, [place]   12/12/2023   After Visit Summary: (MyChart) Due to this being a telephonic visit, the after visit summary with patients personalized plan was offered to patient via MyChart   Nurse Notes: Morgan Norman, CMA

## 2023-12-14 NOTE — Assessment & Plan Note (Signed)
 Most recent bone density shows osteopenia in right femoral neck. She is followed at Osteoporosis clinic at [facility].

## 2023-12-14 NOTE — Assessment & Plan Note (Signed)
Her BMI is acceptable for her demographic. She is encouraged to aim for at least 150 minutes of exercise per week.

## 2023-12-14 NOTE — Assessment & Plan Note (Signed)
 She is s/p lumpectomy on right breast. Treatment plan included adjuvant radiation therapy 10/10/23- 11/07/23 and adjuvant antiestrogen therapy. She is currently on tamoxifen  due to underlying osteoporosis.

## 2023-12-14 NOTE — Assessment & Plan Note (Signed)
 Hyperlipidemia with a lapse in medication for approximately one month. - Refill cholesterol medication prescription.

## 2023-12-14 NOTE — Assessment & Plan Note (Signed)
 Chronic, controlled.  EKG performed, NSR w/o acute changes.  She will continue with telmisartan  20mg  daily. She is encouraged to follow low sodium diet. She will f/u in six months for re-evaluation.

## 2024-01-29 ENCOUNTER — Encounter: Payer: Self-pay | Admitting: Adult Health

## 2024-01-29 ENCOUNTER — Inpatient Hospital Stay: Attending: Adult Health | Admitting: Adult Health

## 2024-01-29 VITALS — BP 132/74 | HR 63 | Temp 97.4°F | Resp 18 | Wt 161.2 lb

## 2024-01-29 DIAGNOSIS — Z170 Estrogen receptor positive status [ER+]: Secondary | ICD-10-CM | POA: Insufficient documentation

## 2024-01-29 DIAGNOSIS — Z1722 Progesterone receptor negative status: Secondary | ICD-10-CM | POA: Insufficient documentation

## 2024-01-29 DIAGNOSIS — Z79899 Other long term (current) drug therapy: Secondary | ICD-10-CM | POA: Diagnosis not present

## 2024-01-29 DIAGNOSIS — Z8052 Family history of malignant neoplasm of bladder: Secondary | ICD-10-CM | POA: Diagnosis not present

## 2024-01-29 DIAGNOSIS — C50411 Malignant neoplasm of upper-outer quadrant of right female breast: Secondary | ICD-10-CM | POA: Diagnosis not present

## 2024-01-29 DIAGNOSIS — Z923 Personal history of irradiation: Secondary | ICD-10-CM | POA: Insufficient documentation

## 2024-01-29 DIAGNOSIS — Z1732 Human epidermal growth factor receptor 2 negative status: Secondary | ICD-10-CM | POA: Insufficient documentation

## 2024-01-29 DIAGNOSIS — N644 Mastodynia: Secondary | ICD-10-CM | POA: Insufficient documentation

## 2024-01-29 NOTE — Progress Notes (Signed)
 SURVIVORSHIP VISIT:  BRIEF ONCOLOGIC HISTORY:  Oncology History  Malignant neoplasm of upper-outer quadrant of right breast in female, estrogen receptor positive (HCC)  07/31/2023 Initial Diagnosis   Screening mammogram detected right breast mass 1.4 cm by ultrasound at 9 o'clock position 4 cm from the nipple, no axillary lymph nodes, biopsy: Grade 2 IDC with intermediate grade DCIS, ER 95%, PR 0%, Ki-67 3%, HER2 equivocal 2+ by IHC, FISH negative ratio 0.95   08/20/2023 Cancer Staging   Staging form: Breast, AJCC 8th Edition - Clinical: Stage IA (cT1c, cN0, cM0, G2, ER+, PR-, HER2-) - Signed by Odean Potts, MD on 08/20/2023 Stage prefix: Initial diagnosis Histologic grading system: 3 grade system   10/09/2023 - 11/07/2023 Radiation Therapy   Plan Name: Breast_R Site: Breast, Right Technique: 3D Mode: Photon Dose Per Fraction: 2.66 Gy Prescribed Dose (Delivered / Prescribed): 42.56 Gy / 42.56 Gy Prescribed Fxs (Delivered / Prescribed): 16 / 16   Plan Name: Breast_R_Bst Site: Breast, Right Technique: 3D Mode: Photon Dose Per Fraction: 2 Gy Prescribed Dose (Delivered / Prescribed): 8 Gy / 8 Gy Prescribed Fxs (Delivered / Prescribed): 4 / 4   10/2023 -  Anti-estrogen oral therapy   Anastrozole x 5-7 years     INTERVAL HISTORY:  Discussed the use of AI scribe software for clinical note transcription with the patient, who gave verbal consent to proceed.  History of Present Illness Morgan Norman is a 68 year old female with breast cancer who presents with breast pain.  She experiences an ache in her right breast, which persists regardless of bra use. The pain is located inside the entire breast, more on the right side, and is not influenced by her sleeping position. She has tried different bras to alleviate the discomfort.  She is currently on tamoxifen , initiated in July, for her right-sided, invasive, stage 1A, estrogen-positive breast cancer. She underwent a lumpectomy  and radiation therapy. Her oncotype score was 16. She did not receive chemotherapy or genetic testing.  Her family history includes her mother having had bladder cancer, with no family history of breast cancer.    REVIEW OF SYSTEMS:  Review of Systems  Constitutional:  Negative for appetite change, chills, fatigue, fever and unexpected weight change.  HENT:   Negative for hearing loss, lump/mass and trouble swallowing.   Eyes:  Negative for eye problems and icterus.  Respiratory:  Negative for chest tightness, cough and shortness of breath.   Cardiovascular:  Negative for chest pain, leg swelling and palpitations.  Gastrointestinal:  Negative for abdominal distention, abdominal pain, constipation, diarrhea, nausea and vomiting.  Endocrine: Negative for hot flashes.  Genitourinary:  Negative for difficulty urinating.   Musculoskeletal:  Negative for arthralgias.  Skin:  Negative for itching and rash.  Neurological:  Negative for dizziness, extremity weakness, headaches and numbness.  Hematological:  Negative for adenopathy. Does not bruise/bleed easily.  Psychiatric/Behavioral:  Negative for depression. The patient is not nervous/anxious.    Breast: Denies any new nodularity, masses, tenderness, nipple changes, or nipple discharge.       PAST MEDICAL/SURGICAL HISTORY:  Past Medical History:  Diagnosis Date   Benign hypertensive renal disease    Breast cancer (HCC) 07/31/2023   Right Breast   CKD (chronic kidney disease) stage 2, GFR 60-89 ml/min    Hypertension    Migraines    Overweight    Pure hypercholesterolemia    Sleep apnea    Vitamin D deficiency    Past Surgical History:  Procedure Laterality  Date   bilateral foot surgery     BREAST BIOPSY Right 07/31/2023   US  RT BREAST BX W LOC DEV 1ST LESION IMG BX SPEC US  GUIDE 07/31/2023 [facility]   BREAST BIOPSY  08/29/2023   US  RT RADIOACTIVE SEED LOC 08/29/2023 [facility]   BREAST LUMPECTOMY WITH RADIOACTIVE  SEED LOCALIZATION Right 09/01/2023   Procedure: BREAST LUMPECTOMY WITH RADIOACTIVE SEED LOCALIZATION;  Surgeon: Vernetta Berg, MD;  Location: [facility];  Service: General;  Laterality: Right;  LMA RADIOACTIVE SEED GUIDED RIGHT BREAST LUMPECTOMY   CESAREAN SECTION     TOTAL ABDOMINAL HYSTERECTOMY W/ BILATERAL SALPINGOOPHORECTOMY       ALLERGIES:  No Known Allergies   CURRENT MEDICATIONS:  Outpatient Encounter Medications as of 01/29/2024  Medication Sig   baclofen  (LIORESAL ) 10 MG tablet Take 1 tablet (10 mg total) by mouth at bedtime as needed for muscle spasms.   Cholecalciferol 5000 UNITS TABS Take 5,000 Units by mouth.   ELDERBERRY PO Take 1,000 mg by mouth.   loratadine (CLARITIN) 10 MG tablet Take 10 mg by mouth daily as needed for allergies.   meclizine  (ANTIVERT ) 25 MG tablet Take 1 tablet (25 mg total) by mouth 3 (three) times daily as needed for dizziness.   meloxicam (MOBIC) 15 MG tablet    Multiple Vitamins-Minerals (IMMUNE SUPPORT PO) Take 1,000 mg by mouth daily at 6 (six) AM.   nystatin  cream (MYCOSTATIN ) Apply topically 3 (three) times daily.   Omega-3 Fatty Acids (FISH OIL PO) Take 120 mg by mouth.   pantoprazole  (PROTONIX ) 20 MG tablet TAKE 1 TABLET(20 MG) BY MOUTH DAILY   Pramoxine-HC (HYDROCORTISONE  ACE-PRAMOXINE) 2.5-1 % CREA Apply 1 Application topically 2 (two) times daily as needed.   rosuvastatin  (CRESTOR ) 10 MG tablet TAKE 1 TABLET(10 MG) BY MOUTH DAILY   tamoxifen  (NOLVADEX ) 20 MG tablet Take 1 tablet (20 mg total) by mouth daily.   telmisartan  (MICARDIS ) 20 MG tablet TAKE 1 TABLET(20 MG) BY MOUTH DAILY   traMADol  (ULTRAM ) 50 MG tablet Take 1 tablet (50 mg total) by mouth every 6 (six) hours as needed for moderate pain (pain score 4-6) or severe pain (pain score 7-10).   triamcinolone  cream (KENALOG ) 0.1 % Apply 1 application. topically 2 (two) times daily.   valACYclovir  (VALTREX ) 500 MG tablet Take 1 tablet (500 mg total) by mouth daily.    No facility-administered encounter medications on file as of 01/29/2024.     ONCOLOGIC FAMILY HISTORY:  Family History  Problem Relation Age of Onset   Diabetes Mellitus I Mother    Bladder Cancer Mother    Dementia Mother    Alcoholism Father      SOCIAL HISTORY:  Social History   Socioeconomic History   Marital status: Married    Spouse name: Not on file   Number of children: Not on file   Years of education: Not on file   Highest education level: Bachelor's degree (e.g., BA, AB, BS)  Occupational History   Occupation: retired  Tobacco Use   Smoking status: Never   Smokeless tobacco: Never  Vaping Use   Vaping status: Never Used  Substance and Sexual Activity   Alcohol use: Not Currently    Alcohol/week: 0.0 standard drinks of alcohol    Comment: rarely   Drug use: No   Sexual activity: Yes  Other Topics Concern   Not on file  Social History Narrative   Drinks 2 cups of coffee daily.   Social Drivers of Health  Financial Resource Strain: Low Risk  (12/06/2023)   Overall Financial Resource Strain (CARDIA)    Difficulty of Paying Living Expenses: Not hard at all  Food Insecurity: No Food Insecurity (12/06/2023)   Hunger Vital Sign    Worried About Running Out of Food in the Last Year: Never true    Ran Out of Food in the Last Year: Never true  Transportation Needs: No Transportation Needs (12/06/2023)   PRAPARE - Administrator, Civil Service (Medical): No    Lack of Transportation (Non-Medical): No  Physical Activity: Sufficiently Active (12/06/2023)   Exercise Vital Sign    Days of Exercise per Week: 3 days    Minutes of Exercise per Session: 60 min  Stress: No Stress Concern Present (12/06/2023)   [employer] of Occupational Health - Occupational Stress Questionnaire    Feeling of Stress: Not at all  Social Connections: Socially Integrated (12/06/2023)   Social Connection and Isolation Panel    Frequency of Communication with Friends  and Family: More than three times a week    Frequency of Social Gatherings with Friends and Family: Once a week    Attends Religious Services: More than 4 times per year    Active Member of [employer] or Organizations: Yes    Attends Banker Meetings: 1 to 4 times per year    Marital Status: Married  Catering manager Violence: Not At Risk (07/22/2023)   Received from [facility]   HITS    Over the last 12 months how often did your partner physically hurt you?: Never    Over the last 12 months how often did your partner insult you or talk down to you?: Never    Over the last 12 months how often did your partner threaten you with physical harm?: Never    Over the last 12 months how often did your partner scream or curse at you?: Never     OBSERVATIONS/OBJECTIVE:  BP 132/74 (BP Location: Left Arm, Patient Position: Sitting)   Pulse 63   Temp (!) 97.4 F (36.3 C) (Temporal)   Resp 18   Wt 161 lb 3 oz (73.1 kg)   SpO2 99%   BMI 26.82 kg/m  GENERAL: Patient is a well appearing female in no acute distress HEENT:  Sclerae anicteric.  Oropharynx clear and moist. No ulcerations or evidence of oropharyngeal candidiasis. Neck is supple.  NODES:  No cervical, supraclavicular, or axillary lymphadenopathy palpated.  BREAST EXAM:  right bresat s/p lu51mpectomy and radiation, no sign of local recurrence LUNGS:  Clear to auscultation bilaterally.  No wheezes or rhonchi. HEART:  Regular rate and rhythm. No murmur appreciated. ABDOMEN:  Soft, nontender.  Positive, normoactive bowel sounds. No organomegaly palpated. MSK:  No focal spinal tenderness to palpation. Full range of motion bilaterally in the upper extremities. EXTREMITIES:  No peripheral edema.   SKIN:  Clear with no obvious rashes or skin changes. No nail dyscrasia. NEURO:  Nonfocal. Well oriented.  Appropriate affect.   LABORATORY DATA:  None for this visit.  DIAGNOSTIC IMAGING:  None for this visit.      ASSESSMENT AND  PLAN:  Ms.. Goodnow is a pleasant 68 y.o. female with Stage IA right breast invasive ductal carcinoma, ER+/PR+/HER2-, diagnosed in 07/2023, treated with lumpectomy, adjuvant radiation therapy, and anti-estrogen therapy with tamoxifen  beginning in 10/2023.  She presents to the Survivorship Clinic for our initial meeting and routine follow-up post-completion of treatment for breast cancer.    1. Stage  IA right breast cancer:  Ms. Inabinet is continuing to recover from definitive treatment for breast cancer. She will follow-up with her medical oncologist, Dr.  Gudena in 05/2024 with history and physical exam per surveillance protocol.  She will continue her anti-estrogen therapy with tamoxifen . Thus far, she is tolerating the Tamoxifen  well, with minimal side effects. Her mammogram is due 06/2024; orders placed today.   She was gtiven information about guardant reveal testing and will let us  know if she wants to undergo this.    Today, a comprehensive survivorship care plan and treatment summary was reviewed with the patient today detailing her breast cancer diagnosis, treatment course, potential late/long-term effects of treatment, appropriate follow-up care with recommendations for the future, and patient education resources.  A copy of this summary, along with a letter will be sent to the patient's primary care provider via mail/fax/In Basket message after today's visit.    2. Breast pain: sending in scripts for bras at [facility] to see if they may help with some of her discomfort  3. Bone health:  Following with osteoporosis specialist. She was given education on specific activities to promote bone health.  4. Cancer screening:  Due to Ms. Shipman-Edison Nicholson's history and her age, she should receive screening for skin cancers, colon cancer, and gynecologic cancers.  The information and recommendations are listed on the patient's comprehensive care plan/treatment summary and were reviewed  in detail with the patient.    5. Health maintenance and wellness promotion: Ms. Nam was encouraged to consume 5-7 servings of fruits and vegetables per day. We reviewed the Nutrition Rainbow handout.  She was also encouraged to engage in moderate to vigorous exercise for 30 minutes per day most days of the week.  She was instructed to limit her alcohol consumption and continue to abstain from tobacco use.     6. Support services/counseling: It is not uncommon for this period of the patient's cancer care trajectory to be one of many emotions and stressors.   She was given information regarding our available services and encouraged to contact me with any questions or for help enrolling in any of our support group/programs.    Follow up instructions:    -Return to cancer center in 05/2024 months for f/u with Dr. Odean  -Mammogram due in 06/2024; orders placed today -Follow up with [facility] 11/2024 -DEXA with osteoporosis specialist -She is welcome to return back to the Survivorship Clinic at any time; no additional follow-up needed at this time.  -Consider referral back to survivorship as a long-term survivor for continued surveillance  The patient was provided an opportunity to ask questions and all were answered. The patient agreed with the plan and demonstrated an understanding of the instructions.   Total encounter time:45 minutes*in face-to-face visit time, chart review, lab review, care coordination, order entry, and documentation of the encounter time.    Morna Kendall, NP 01/29/24 10:33 AM Medical Oncology and Hematology [facility] [address] [place], [place] [address] Tel. [phone]    Fax. [phone]  *Total Encounter Time as defined by the Centers for Medicare and Medicaid Services includes, in addition to the face-to-face time of a patient visit (documented in the note above) non-face-to-face time: obtaining and reviewing outside history, ordering  and reviewing medications, tests or procedures, care coordination (communications with other health care professionals or caregivers) and documentation in the medical record.

## 2024-02-05 DIAGNOSIS — K219 Gastro-esophageal reflux disease without esophagitis: Secondary | ICD-10-CM | POA: Diagnosis not present

## 2024-02-05 DIAGNOSIS — Z923 Personal history of irradiation: Secondary | ICD-10-CM | POA: Diagnosis not present

## 2024-02-05 DIAGNOSIS — Z8781 Personal history of (healed) traumatic fracture: Secondary | ICD-10-CM | POA: Diagnosis not present

## 2024-02-05 DIAGNOSIS — M818 Other osteoporosis without current pathological fracture: Secondary | ICD-10-CM | POA: Diagnosis not present

## 2024-02-05 DIAGNOSIS — Z79899 Other long term (current) drug therapy: Secondary | ICD-10-CM | POA: Diagnosis not present

## 2024-02-05 DIAGNOSIS — Z853 Personal history of malignant neoplasm of breast: Secondary | ICD-10-CM | POA: Diagnosis not present

## 2024-02-09 DIAGNOSIS — D485 Neoplasm of uncertain behavior of skin: Secondary | ICD-10-CM | POA: Diagnosis not present

## 2024-02-09 DIAGNOSIS — C4441 Basal cell carcinoma of skin of scalp and neck: Secondary | ICD-10-CM | POA: Diagnosis not present

## 2024-02-12 DIAGNOSIS — M545 Low back pain, unspecified: Secondary | ICD-10-CM | POA: Diagnosis not present

## 2024-02-12 DIAGNOSIS — D696 Thrombocytopenia, unspecified: Secondary | ICD-10-CM | POA: Diagnosis not present

## 2024-02-12 DIAGNOSIS — I129 Hypertensive chronic kidney disease with stage 1 through stage 4 chronic kidney disease, or unspecified chronic kidney disease: Secondary | ICD-10-CM | POA: Diagnosis not present

## 2024-02-12 DIAGNOSIS — M461 Sacroiliitis, not elsewhere classified: Secondary | ICD-10-CM | POA: Diagnosis not present

## 2024-02-12 DIAGNOSIS — E785 Hyperlipidemia, unspecified: Secondary | ICD-10-CM | POA: Diagnosis not present

## 2024-02-12 DIAGNOSIS — K219 Gastro-esophageal reflux disease without esophagitis: Secondary | ICD-10-CM | POA: Diagnosis not present

## 2024-02-12 DIAGNOSIS — M810 Age-related osteoporosis without current pathological fracture: Secondary | ICD-10-CM | POA: Diagnosis not present

## 2024-02-12 DIAGNOSIS — C50919 Malignant neoplasm of unspecified site of unspecified female breast: Secondary | ICD-10-CM | POA: Diagnosis not present

## 2024-02-12 DIAGNOSIS — R32 Unspecified urinary incontinence: Secondary | ICD-10-CM | POA: Diagnosis not present

## 2024-02-13 ENCOUNTER — Encounter: Payer: Self-pay | Admitting: Internal Medicine

## 2024-03-17 DIAGNOSIS — M85851 Other specified disorders of bone density and structure, right thigh: Secondary | ICD-10-CM | POA: Diagnosis not present

## 2024-03-17 LAB — HM DEXA SCAN

## 2024-03-23 ENCOUNTER — Ambulatory Visit: Payer: Self-pay | Admitting: Internal Medicine

## 2024-03-23 ENCOUNTER — Encounter: Payer: Self-pay | Admitting: Internal Medicine

## 2024-04-14 ENCOUNTER — Ambulatory Visit

## 2024-05-31 ENCOUNTER — Inpatient Hospital Stay: Admitting: Hematology and Oncology

## 2024-07-09 ENCOUNTER — Encounter

## 2024-12-14 ENCOUNTER — Encounter: Payer: Self-pay | Admitting: Internal Medicine

## 2025-01-12 ENCOUNTER — Ambulatory Visit: Payer: Self-pay
